# Patient Record
Sex: Male | Born: 1935 | Race: White | Hispanic: No | Marital: Married | Smoking: Former smoker
Health system: Southern US, Community
[De-identification: ages and names within clinical notes are randomized; demographics above are authoritative.]

## PROBLEM LIST (undated history)

## (undated) DIAGNOSIS — I350 Nonrheumatic aortic (valve) stenosis: Secondary | ICD-10-CM

## (undated) DIAGNOSIS — I10 Essential (primary) hypertension: Secondary | ICD-10-CM

## (undated) DIAGNOSIS — T4145XA Adverse effect of unspecified anesthetic, initial encounter: Secondary | ICD-10-CM

## (undated) DIAGNOSIS — T8859XA Other complications of anesthesia, initial encounter: Secondary | ICD-10-CM

## (undated) DIAGNOSIS — E785 Hyperlipidemia, unspecified: Secondary | ICD-10-CM

## (undated) DIAGNOSIS — I5032 Chronic diastolic (congestive) heart failure: Secondary | ICD-10-CM

## (undated) DIAGNOSIS — I472 Ventricular tachycardia, unspecified: Secondary | ICD-10-CM

## (undated) DIAGNOSIS — I2510 Atherosclerotic heart disease of native coronary artery without angina pectoris: Secondary | ICD-10-CM

## (undated) DIAGNOSIS — I4891 Unspecified atrial fibrillation: Secondary | ICD-10-CM

## (undated) DIAGNOSIS — I4892 Unspecified atrial flutter: Secondary | ICD-10-CM

## (undated) DIAGNOSIS — M1990 Unspecified osteoarthritis, unspecified site: Secondary | ICD-10-CM

## (undated) DIAGNOSIS — K219 Gastro-esophageal reflux disease without esophagitis: Secondary | ICD-10-CM

## (undated) DIAGNOSIS — I272 Pulmonary hypertension, unspecified: Secondary | ICD-10-CM

## (undated) DIAGNOSIS — K429 Umbilical hernia without obstruction or gangrene: Secondary | ICD-10-CM

## (undated) DIAGNOSIS — R252 Cramp and spasm: Secondary | ICD-10-CM

## (undated) DIAGNOSIS — J449 Chronic obstructive pulmonary disease, unspecified: Secondary | ICD-10-CM

## (undated) DIAGNOSIS — I219 Acute myocardial infarction, unspecified: Secondary | ICD-10-CM

## (undated) DIAGNOSIS — N189 Chronic kidney disease, unspecified: Secondary | ICD-10-CM

## (undated) HISTORY — DX: Chronic obstructive pulmonary disease, unspecified: J44.9

## (undated) HISTORY — DX: Unspecified atrial flutter: I48.92

## (undated) HISTORY — DX: Nonrheumatic aortic (valve) stenosis: I35.0

## (undated) HISTORY — DX: Unspecified atrial fibrillation: I48.91

## (undated) HISTORY — DX: Chronic kidney disease, unspecified: N18.9

## (undated) HISTORY — PX: HERNIA REPAIR: SHX51

## (undated) HISTORY — PX: BACK SURGERY: SHX140

## (undated) HISTORY — DX: Hyperlipidemia, unspecified: E78.5

## (undated) HISTORY — PX: APPENDECTOMY: SHX54

## (undated) HISTORY — DX: Chronic diastolic (congestive) heart failure: I50.32

## (undated) HISTORY — DX: Cramp and spasm: R25.2

## (undated) HISTORY — DX: Umbilical hernia without obstruction or gangrene: K42.9

## (undated) HISTORY — DX: Atherosclerotic heart disease of native coronary artery without angina pectoris: I25.10

## (undated) HISTORY — DX: Essential (primary) hypertension: I10

## (undated) HISTORY — PX: CORONARY ARTERY BYPASS GRAFT: SHX141

## (undated) HISTORY — PX: CHOLECYSTECTOMY: SHX55

---

## 2007-06-18 ENCOUNTER — Ambulatory Visit: Payer: Self-pay | Admitting: Cardiology

## 2007-06-19 ENCOUNTER — Ambulatory Visit: Payer: Self-pay | Admitting: Cardiology

## 2007-06-22 ENCOUNTER — Ambulatory Visit: Payer: Self-pay | Admitting: Cardiology

## 2007-06-29 ENCOUNTER — Ambulatory Visit: Payer: Self-pay | Admitting: Internal Medicine

## 2007-06-29 LAB — CONVERTED CEMR LAB
Basophils Absolute: 0 10*3/uL (ref 0.0–0.1)
Creatinine, Ser: 1.1 mg/dL (ref 0.4–1.5)
Eosinophils Absolute: 0.1 10*3/uL (ref 0.0–0.6)
Eosinophils Relative: 2.2 % (ref 0.0–5.0)
Glucose, Bld: 118 mg/dL — ABNORMAL HIGH (ref 70–99)
HCT: 37.5 % — ABNORMAL LOW (ref 39.0–52.0)
Hemoglobin: 12.8 g/dL — ABNORMAL LOW (ref 13.0–17.0)
MCHC: 34.1 g/dL (ref 30.0–36.0)
MCV: 92.6 fL (ref 78.0–100.0)
Monocytes Absolute: 0.6 10*3/uL (ref 0.2–0.7)
Neutrophils Relative %: 52.4 % (ref 43.0–77.0)
Potassium: 4.2 meq/L (ref 3.5–5.1)
RDW: 13.3 % (ref 11.5–14.6)
Sodium: 140 meq/L (ref 135–145)
aPTT: 72.1 s — ABNORMAL HIGH (ref 21.7–29.8)

## 2007-07-06 ENCOUNTER — Observation Stay (HOSPITAL_COMMUNITY): Admission: RE | Admit: 2007-07-06 | Discharge: 2007-07-08 | Payer: Self-pay | Admitting: Internal Medicine

## 2007-07-06 ENCOUNTER — Ambulatory Visit: Payer: Self-pay | Admitting: Internal Medicine

## 2007-07-13 ENCOUNTER — Ambulatory Visit: Payer: Self-pay | Admitting: Cardiology

## 2007-07-20 ENCOUNTER — Ambulatory Visit: Payer: Self-pay | Admitting: Cardiology

## 2007-07-27 ENCOUNTER — Ambulatory Visit: Payer: Self-pay | Admitting: Cardiology

## 2007-07-28 ENCOUNTER — Ambulatory Visit: Payer: Self-pay | Admitting: Cardiology

## 2007-07-29 ENCOUNTER — Ambulatory Visit: Payer: Self-pay | Admitting: Cardiology

## 2007-07-29 ENCOUNTER — Inpatient Hospital Stay (HOSPITAL_COMMUNITY): Admission: EM | Admit: 2007-07-29 | Discharge: 2007-08-01 | Payer: Self-pay | Admitting: Emergency Medicine

## 2007-08-07 ENCOUNTER — Ambulatory Visit: Payer: Self-pay | Admitting: Cardiology

## 2007-08-24 ENCOUNTER — Ambulatory Visit: Payer: Self-pay | Admitting: Cardiology

## 2007-08-27 ENCOUNTER — Ambulatory Visit: Payer: Self-pay | Admitting: Internal Medicine

## 2007-08-27 LAB — CONVERTED CEMR LAB
Creatinine, Ser: 1.3 mg/dL (ref 0.4–1.5)
Glucose, Bld: 105 mg/dL — ABNORMAL HIGH (ref 70–99)
Magnesium: 2.2 mg/dL (ref 1.5–2.5)
Potassium: 4.1 meq/L (ref 3.5–5.1)
Sodium: 141 meq/L (ref 135–145)

## 2007-09-21 ENCOUNTER — Ambulatory Visit: Payer: Self-pay | Admitting: Cardiology

## 2007-10-19 ENCOUNTER — Ambulatory Visit: Payer: Self-pay | Admitting: Cardiology

## 2007-11-13 ENCOUNTER — Ambulatory Visit: Payer: Self-pay | Admitting: Internal Medicine

## 2008-07-13 ENCOUNTER — Ambulatory Visit: Payer: Self-pay | Admitting: Internal Medicine

## 2008-12-28 DIAGNOSIS — R252 Cramp and spasm: Secondary | ICD-10-CM

## 2008-12-28 DIAGNOSIS — K429 Umbilical hernia without obstruction or gangrene: Secondary | ICD-10-CM | POA: Insufficient documentation

## 2008-12-28 DIAGNOSIS — J4489 Other specified chronic obstructive pulmonary disease: Secondary | ICD-10-CM | POA: Insufficient documentation

## 2008-12-28 DIAGNOSIS — I4891 Unspecified atrial fibrillation: Secondary | ICD-10-CM

## 2008-12-28 DIAGNOSIS — I4892 Unspecified atrial flutter: Secondary | ICD-10-CM | POA: Insufficient documentation

## 2008-12-28 DIAGNOSIS — R5383 Other fatigue: Secondary | ICD-10-CM

## 2008-12-28 DIAGNOSIS — I10 Essential (primary) hypertension: Secondary | ICD-10-CM | POA: Insufficient documentation

## 2008-12-28 DIAGNOSIS — J449 Chronic obstructive pulmonary disease, unspecified: Secondary | ICD-10-CM

## 2008-12-28 DIAGNOSIS — R0602 Shortness of breath: Secondary | ICD-10-CM

## 2008-12-28 DIAGNOSIS — R5381 Other malaise: Secondary | ICD-10-CM

## 2008-12-28 DIAGNOSIS — E785 Hyperlipidemia, unspecified: Secondary | ICD-10-CM

## 2008-12-29 ENCOUNTER — Ambulatory Visit: Payer: Self-pay | Admitting: Internal Medicine

## 2009-02-27 ENCOUNTER — Encounter: Payer: Self-pay | Admitting: *Deleted

## 2009-07-24 ENCOUNTER — Telehealth: Payer: Self-pay | Admitting: Internal Medicine

## 2009-08-04 ENCOUNTER — Ambulatory Visit: Payer: Self-pay | Admitting: Internal Medicine

## 2010-08-09 ENCOUNTER — Ambulatory Visit
Admission: RE | Admit: 2010-08-09 | Discharge: 2010-08-09 | Payer: Self-pay | Source: Home / Self Care | Attending: Internal Medicine | Admitting: Internal Medicine

## 2010-08-13 ENCOUNTER — Encounter: Payer: Self-pay | Admitting: Internal Medicine

## 2010-08-14 NOTE — Progress Notes (Signed)
Summary: refill meds  Phone Note Refill Request Call back at Home Phone [phone] Message from:  Patient on July 24, 2009 4:10 PM  Refills Requested: Medication #1:  KLOR-CON M20 20 MEQ CR-TABS Take 1 tablet by mouth once a day  Medication #2:  LISINOPRIL 5 MG TABS Take 1 tablet by mouth twice a day  Medication #3:  FUROSEMIDE 40 MG TABS Take 1 tablet by mouth once a day [facility] [place] [facility] [phone]   Method Requested: Fax to Local Pharmacy Initial call taken by: Lorne Skeens,  July 24, 2009 4:11 PM  Follow-up for Phone Call        Rx faxed to pharmacy Follow-up by: Vikki Ports,  July 24, 2009 4:20 PM    Prescriptions: LISINOPRIL 5 MG TABS (LISINOPRIL) Take 1 tablet by mouth twice a day  #60 x 6   Entered by:   Vikki Ports   Authorized by:   Laren Boom, MD, [facility]   Signed by:   Vikki Ports on 07/24/2009   Method used:   Faxed to ...       [facility] [address] (retail)       [address]       [place], [place]  [address]       Ph: [phone] or [phone]       Fax: [phone]   RxID:   [id number] KLOR-CON M20 20 MEQ CR-TABS (POTASSIUM CHLORIDE CRYS CR) Take 1 tablet by mouth once a day  #30 x 6   Entered by:   Vikki Ports   Authorized by:   Laren Boom, MD, [facility]   Signed by:   Vikki Ports on 07/24/2009   Method used:   Faxed to ...       [facility] [address] (retail)       [address]       [place], [place]  [address]       Ph: [phone] or [phone]       Fax: [phone]   RxID:   [id number] FUROSEMIDE 40 MG TABS (FUROSEMIDE) Take 1 tablet by mouth once a day  #30 x 6   Entered by:   Vikki Ports   Authorized by:   Laren Boom, MD, [facility]   Signed by:   Vikki Ports on 07/24/2009   Method used:   Faxed to ...       [facility] [address] (retail)       [address]       [place], [place]  [address]       Ph: [phone] or [phone]       Fax: [phone]   RxID:   [id number]

## 2010-08-14 NOTE — Assessment & Plan Note (Signed)
Summary: per check out/sf   Visit Type:  Follow-up Primary Provider:  Dr.Alexander at [facility]   History of Present Illness: Juan Wolf returns today for followup.  He is a pleasant 76 yo man with a h/o atrial flutter s/p ablation who developed symptomatic atrial fibrillation which has been well controlled with twice daily Tikosyn.  He denies c/p, sob, or palpitations.  No syncope. Minimal peripheral edema.  Current Medications (verified): 1)  Furosemide 40 Mg Tabs (Furosemide) .... Take 1 Tablet By Mouth Once A Day 2)  Klor-Con M20 20 Meq Cr-Tabs (Potassium Chloride Crys Cr) .... Take 1 Tablet By Mouth Once A Day 3)  Lisinopril 5 Mg Tabs (Lisinopril) .... Take 1 Tablet By Mouth Twice A Day 4)  Tikosyn 125 Mcg Caps (Dofetilide) .... Take 3 Capsules By Mouth Every Twelve Hours 5)  Warfarin Sodium 5 Mg Tabs (Warfarin Sodium) .... Take 1 Tablet By Mouth Once A Day  Allergies (verified): No Known Drug Allergies  Past History:  Past Medical History: Last updated: 12/28/2008 WEAKNESS (ICD-780.79) DYSPNEA (ICD-786.05) UMBILICAL HERNIA (ICD-553.1) DYSLIPIDEMIA (ICD-272.4) COPD (ICD-496) HYPERTENSION (ICD-401.9) LEG CRAMPS (ICD-729.82) ATRIAL FLUTTER (ICD-427.32) ATRIAL FIBRILLATION (ICD-427.31)  Past Surgical History: Last updated: 12/28/2008 Appendectomy Back Surgery Cholecystectomy  Review of Systems  The patient denies chest pain, syncope, dyspnea on exertion, and peripheral edema.    Vital Signs:  Patient profile:   77 year old male Height:      67 inches Weight:      211 pounds Pulse rate:   77 / minute BP sitting:   130 / 70  (left arm)  Vitals Entered By: Laurance Flatten CMA (August 04, 2009 4:04 PM)  Physical Exam  General:  Elderly well developed, well nourished, in no acute distress. Head:  normocephalic and atraumatic Eyes:  PERRLA/EOM intact; conjunctiva and lids normal. Mouth:  Teeth, gums and palate normal. Oral mucosa normal. Neck:   Neck supple, no JVD. No masses, thyromegaly or abnormal cervical nodes. Lungs:  Clear bilaterally to auscultation with no wheezes, rales, or rhonchi. Heart:  RRR with normal S1 and S2.  PMI is not enlarged or laterally displaced.  A soft systolic murmur is present at the base. Abdomen:  Bowel sounds positive; abdomen soft and non-tender without masses, organomegaly, or hernias noted. No hepatosplenomegaly. Msk:  Back normal, normal gait. Muscle strength and tone normal. Pulses:  pulses normal in all 4 extremities Extremities:  No clubbing or cyanosis. Neurologic:  Alert and oriented x 3.   EKG  Procedure date:  08/04/2009  Findings:      Normal sinus rhythm with rate of:  77  Impression & Recommendations:  Problem # 1:  ATRIAL FIBRILLATION (ICD-427.31) His rhythm has been well controlled with medications as noted below.  His QT is about 470 corrected. The following medications were removed from the medication list:    Tikosyn 250 Mcg Caps (Dofetilide) .Marland Kitchen... Take 1 capsule by mouth every twelve hours His updated medication list for this problem includes:    Tikosyn 125 Mcg Caps (Dofetilide) .Marland Kitchen... Take 3 capsules by mouth every twelve hours    Warfarin Sodium 5 Mg Tabs (Warfarin sodium) .Marland Kitchen... Take 1 tablet by mouth once a day  Problem # 2:  HYPERTENSION (ICD-401.9) His blood pressure appears to be fairly well controlled.  Continue current meds. A low sodium diet is recommended. His updated medication list for this problem includes:    Furosemide 40 Mg Tabs (Furosemide) .Marland Kitchen... Take 1 tablet by mouth once  a day    Lisinopril 5 Mg Tabs (Lisinopril) .Marland Kitchen... Take 1 tablet by mouth twice a day  Problem # 3:  COPD (ICD-496) He is asymptomatic at the present.  He does not smoke cigarettes.  Patient Instructions: 1)  Your physician recommends that you schedule a follow-up appointment in: 12 months

## 2010-08-16 NOTE — Assessment & Plan Note (Signed)
Summary: pc2   Primary Provider:  Dr.Alexander at [facility]  CC:  pt has no complaints today.  History of Present Illness: [name] returns today for followup.  He is a pleasant 77 yo man with a h/o atrial flutter s/p ablation who developed symptomatic atrial fibrillation which has been well controlled with twice daily Tikosyn.  He denies c/p, sob, or palpitations.  No syncope. Minimal peripheral edema.   Current Medications (verified): 1)  Furosemide 40 Mg Tabs (Furosemide) .... Take 1 Tablet By Mouth Once A Day 2)  Klor-Con M20 20 Meq Cr-Tabs (Potassium Chloride Crys Cr) .... Take 1 Tablet By Mouth Once A Day 3)  Lisinopril 5 Mg Tabs (Lisinopril) .... Take 1 Tablet By Mouth Twice A Day 4)  Tikosyn 125 Mcg Caps (Dofetilide) .... Take 3 Capsules By Mouth Every Twelve Hours 5)  Warfarin Sodium 5 Mg Tabs (Warfarin Sodium) .... Take 1 Tablet By Mouth Once A Day 6)  Zolpidem Tartrate 10 Mg Tabs (Zolpidem Tartrate) .Marland Kitchen.. 1 Tablet Before Bed Every Night 7)  Tylenol Extra Strength 500 Mg Tabs (Acetaminophen) .... As Needed  Allergies (verified): No Known Drug Allergies  Past History:  Past Medical History: Last updated: 12/28/2008 WEAKNESS (ICD-780.79) DYSPNEA (ICD-786.05) UMBILICAL HERNIA (ICD-553.1) DYSLIPIDEMIA (ICD-272.4) COPD (ICD-496) HYPERTENSION (ICD-401.9) LEG CRAMPS (ICD-729.82) ATRIAL FLUTTER (ICD-427.32) ATRIAL FIBRILLATION (ICD-427.31)  Past Surgical History: Last updated: 12/28/2008 Appendectomy Back Surgery Cholecystectomy  Review of Systems  The patient denies chest pain, syncope, dyspnea on exertion, and peripheral edema.    Vital Signs:  Patient profile:   77 year old male Height:      67 inches Weight:      208.25 pounds BMI:     32.73 Pulse rate:   80 / minute BP sitting:   132 / 62  (left arm)  Vitals Entered By: Celestia Khat, CMA (August 09, 2010 3:34 PM)  Physical Exam  General:  Elderly well developed, well nourished,  in no acute distress. Head:  normocephalic and atraumatic Eyes:  PERRLA/EOM intact; conjunctiva and lids normal. Mouth:  Teeth, gums and palate normal. Oral mucosa normal. Neck:  Neck supple, no JVD. No masses, thyromegaly or abnormal cervical nodes. Lungs:  Clear bilaterally to auscultation with no wheezes, rales, or rhonchi. Heart:  RRR with normal S1 and S2.  PMI is not enlarged or laterally displaced.  A soft systolic murmur is present at the base. Abdomen:  Bowel sounds positive; abdomen soft and non-tender without masses, organomegaly, or hernias noted. No hepatosplenomegaly. Msk:  Back normal, normal gait. Muscle strength and tone normal. Pulses:  pulses normal in all 4 extremities Extremities:  No clubbing or cyanosis. Neurologic:  Alert and oriented x 3.   EKG  Procedure date:  08/09/2010  Findings:      Normal sinus rhythm with rate of:  70.  Impression & Recommendations:  Problem # 1:  ATRIAL FIBRILLATION (ICD-427.31) He has remained NSR since his starting Tikosyn. He will continue his current meds. His updated medication list for this problem includes:    Tikosyn 125 Mcg Caps (Dofetilide) .Marland Kitchen... Take 3 capsules by mouth every twelve hours    Warfarin Sodium 5 Mg Tabs (Warfarin sodium) .Marland Kitchen... Take 1 tablet by mouth once a day  Problem # 2:  HYPERTENSION (ICD-401.9) His blood pressure is well controlled. His updated medication list for this problem includes:    Furosemide 40 Mg Tabs (Furosemide) .Marland Kitchen... Take 1 tablet by mouth once a day    Lisinopril 5  Mg Tabs (Lisinopril) .Marland Kitchen... Take 1 tablet by mouth twice a day  Patient Instructions: 1)  Your physician wants you to follow-up in:  12 months with Dr Court Joy will receive a reminder letter in the mail two months in advance. If you don't receive a letter, please call our office to schedule the follow-up appointment. 2)  Your physician recommends that you continue on your current medications as directed. Please refer to the  Current Medication list given to you today.

## 2010-11-27 NOTE — Op Note (Signed)
NAME:  Juan Wolf, Juan Wolf NO.:  [id number]   MEDICAL RECORD NO.:  [id number]          PATIENT TYPE:  OIB   LOCATION:  2899                         FACILITY:  [facility]   PHYSICIAN:  Doylene Canning. Ladona Ridgel, MD    DATE OF BIRTH:  23-Sep-1921   DATE OF PROCEDURE:  07/06/2007  DATE OF DISCHARGE:                               OPERATIVE REPORT   PROCEDURE PERFORMED:  Electrophysiologic study and catheter ablation of  atrial flutter.   INTRODUCTION:  The patient is an 77 year old male with a history of  symptomatic typical atrial flutter, who we saw in the office a week ago  and was found to be subtherapeutic with his Coumadin.  He was brought in  today for a TEE, but was found to be in sinus rhythm; having  spontaneously reverted after approximately 2 months of atrial flutter.  He is now referred for electrophysiologic study and catheter ablation of  his atrial flutter.   PROCEDURE:  After informed consent was obtained, the patient was taken  to the diagnostic EP lab in a fasting state.  After the usual  preparation and draping, intravenous fentanyl and midazolam was given  for sedation.  A 6-French hexapolar catheter was inserted percutaneously  into the right jugular vein and advanced to the coronary sinus.  A 5-  French quadripolar catheter was inserted percutaneously in the right  femoral vein and advanced to the His bundle region.  A 7-French 24 halo  catheter was inserted percutaneously in the right femoral vein and  advanced to the right atrium.  A 7-French quadripolar ablation catheter  was inserted percutaneously into the right femoral vein, and advanced  into the right atrium.  All catheter placements were done under  fluoroscopic guidance.  With the catheter in satisfactory position,  programmed atrial stimulation was carried out from the coronary sinus  and high right atrium. at a basic drive cycle length of 478  milliseconds.  The S1-S2 interval was stepwise decreased  from 500  milliseconds down to 450 milliseconds, where the AV node ERP was  observed.  During programmed atrial stimulation there were no AH jumps  and no echo beats noted.   Next, rapid atrial pacing was carried out from the coronary sinus at a  pacing cycle length of 600 milliseconds.  The pacing cycle length was  stepwise decreased down to 470 milliseconds, where AV Wenckebach was  observed.  During rapid atrial pacing the PR interval was less than the  RR interval, and there was no inducible SVT.  Next, additional rapid  atrial pacing was carried out at 290 milliseconds and stepwise decreased  down to 240 milliseconds; resulting in the initiation of atrial flutter.  This was typical counterclockwise tricuspid annular reentrant atrial  flutter, with a cycle length of 260 milliseconds demonstrated during  mapping.  The 7-French quadripolar ablation catheter was then maneuvered  into the usual atrial flutter isthmus.  A single RF energy application  was delivered, which terminated atrial flutter and restored sinus  rhythm.  Following this, additional RF energy application was delivered,  resulting in the initiation of  atrial fibrillation.  During atrial  fibrillation 2 additional RF energy applications were delivered, for a  total of 4 RF energy applications.  At this point the patient maintained  atrial fibrillation, with a controlled ventricular response.  A gram of  magnesium was then delivered intravenously, as well as 1 mg of  ibutilide.  Despite this the patient did not return to sinus rhythm.  The patient was then heavily sedated with fentanyl and Versed, and DC  cardioverted with 200 joules of synchronized biphasic energy --  restoring sinus rhythm.  At this point, rapid atrial pacing demonstrated  bidirectional atrial flutter isthmus block, and the patient was observed  for a total of 30 minutes following ablation and had persistent isthmus  block.   At this point, the  ablation catheter was maneuvered into the right  ventricle and rapid ventricular pacing was carried out demonstrating VA  Wenckebach at 600 milliseconds.  In addition, programmed ventricular  stimulation was carried out at 600 milliseconds, demonstrating VA  Wenckebach.  At this point the catheters were removed.  Hemostasis was  assured and the patient was returned to his room in satisfactory  condition.   COMPLICATIONS:  There were no immediate procedure complications.   RESULTS:  1. BASELINE ECG.  Baseline ECG demonstrates sinus rhythm with normal      axis and intervals.  2. BASELINE INTERVALS:  Sinus node cycle length was 950 milliseconds.      The HV interval was 38 milliseconds.  The QRS duration was 84      milliseconds.  3. RAPID VENTRICULAR PACING.  Rapid ventricular pacing was carried out      from the RV apex, demonstrating VA dissociation at 600      milliseconds.  4. PROGRAMMED VENTRICULAR STIMULATION.  Programmed ventricular      stimulation was carried out from the RV apex at a basic drive cycle      length of 600 milliseconds.  This demonstrated VA dissociation.  5. RAPID ATRIAL PACING.  Rapid atrial pacing was carried out from the      coronary sinus and high-right atrium, at a pacing cycle length of      600 milliseconds and stepwise decreased down to 470 milliseconds;      where AV Wenckebach was observed.  During rapid atrial pacing the      PR interval was less than the RR interval, and there was no      inducible SVT.  Additional decrements in the atrial pacing cycle      length down to 240 milliseconds prior to ablation resulted in the      initiation of atrial flutter.  6. PROGRAMMED ATRIAL STIMULATION:  Programmed atrial stimulation was      carried out from the coronary sinus and the high-right atrium at a      basic drive cycle length of 086 milliseconds.  The S1-S2 interval      was stepwise decreased from 500 milliseconds down to 450      milliseconds,  where the AV node ERP was observed.  During      programmed atrial stimulation there were no AH jumps and no echo      beats noted.  7. ARRHYTHMIAS OBSERVED.      a.     Atrial flutter initiation, rapid atrial pacing.  Duration       was sustained.  Termination was with catheter ablation.  Cycle       length 260 milliseconds.  b.     Atrial fibrillation initiation was spontaneous.  Duration       was sustained.  Termination was with DC cardioversion.      c.     AH mapping.  Mapping of atrial flutter demonstrated typical       counterclockwise tricuspid annular reentrant atrial flutter.  The       atrial flutter isthmus was of usual size and orientation.      d.     RF energy application.  A total of 4 RF energy applications       were delivered to the atrial flutter isthmus; resulting in       termination of flutter and restoration of sinus rhythm, and       creation of bidirectional block and atrial flutter isthmus.   CONCLUSION:  This study demonstrates successful electrophysiologic study  and RF catheter ablation of typical atrial flutter; with a total of 4 RF  energy applications delivered to the usual atrial flutter isthmus.  Resulting in termination of flutter, restoration of sinus rhythm and  creation of bidirectional block in the atrial flutter isthmus.  In  addition, this study demonstrates spontaneous occurring atrial  fibrillation of unclear clinical significance.      Doylene Canning. Ladona Ridgel, MD  Electronically Signed     GWT/MEDQ  D:  07/06/2007  T:  07/06/2007  Job:  [id number]   cc:   Learta Codding, MD,FACC  Dhruv Sherril Croon

## 2010-11-27 NOTE — Discharge Summary (Signed)
NAME:  Juan Wolf, Juan Wolf NO.:  [id number]   MEDICAL RECORD NO.:  [id number]          PATIENT TYPE:  OIB   LOCATION:  4730                         FACILITY:  [facility]   PHYSICIAN:  Doylene Canning. Ladona Ridgel, MD    DATE OF BIRTH:  1921/10/17   DATE OF ADMISSION:  07/06/2007  DATE OF DISCHARGE:  07/08/2007                               DISCHARGE SUMMARY   This patient has no known drug allergies.   Dictation greater than 35 minutes time.   FINAL DIAGNOSES:  1. Discharging day #2 status post electrophysiology study      radiofrequency catheter ablation of typical atrial flutter.  2. Spontaneous atrial fibrillation develops during the      electrophysiology procedure.  3. DCCV atrial fibrillation to sinus rhythm.  4. Sinus rhythm devolves to slow atrial fibrillation at about 5:00      p.m. after the procedure on December 22.  5. Patient has pauses in the overnight, longest 2.79 seconds when in      atrial fibrillation  6. Patient discharging in atrial fibrillation, but he says he feels      much better after ablation.  7. We planned the patient to be readmitted for Tikosyn therapy in the      future.   SECONDARY DIAGNOSES:  1. History of coronary artery disease status post coronary artery      bypass graft surgery in 1997.      a.     Percutaneous transluminal coronary angioplasty March 02, 1986.      b.     Rhythm problems after coronary artery bypass grafting,       question atrial fibrillation.  2. Two-D echocardiogram June 18, 2007; ejection fraction of 55-60%;      the basal inferior wall is akinetic.  3. Chronic obstructive pulmonary disease.  4. Hypertension.  5. Dyslipidemia.  6. History of umbilical hernia.  7. Status post cholecystectomy, back surgery and appendectomy.   PROCEDURE:  July 06, 2007, electrophysiology study with finding of  typical inducible atrial flutter.  The patient was in sinus rhythm at  the beginning of the procedure.  The  patient had a typical  counterclockwise caval tricuspid annulus reentry mechanism, and the  patient had successful radiofrequency catheter ablation.  There was  spontaneous atrial fibrillation during radiofrequency catheter ablation  therapy, and the patient had cardioversion prior to completing the  procedure.  The patient then was in sinus rhythm at the end of the  procedure.   BRIEF HISTORY:  Juan Wolf is an 77 year old male.  He has a history of  ischemic heart disease.  He is status post coronary artery bypass graft  surgery in 1997.   He has a history of palpitation and in the month preceding this  admission has increasing dyspnea.  He saw Dr. Sherril Croon.  Dr. Sherril Croon found he  was in atrial flutter.  He was referred to Dr. Andee Lineman.  He was started  on Coumadin.  He is now referred for catheter ablation of atrial  flutter.  He has no history of  frank syncope, very minimal chest  discomfort.  He has palpitations with flutter.  No pleuritic chest pain,  no heat or cold intolerance, no dysuria.   Treatment options have been discussed with the patient.  Risks, benefits  and expectations of catheter ablation have been discussed.  The patient  is not therapeutic with his INR.  He will come in for transesophageal  echocardiogram prior to the electrophysiology study.   HOSPITAL COURSE:  The patient presented December 22 electively.  He was  in sinus rhythm.  Transesophageal echocardiogram was not done.  He went  directly to the electrophysiology lab where he had atypical tricuspid  annulus dependent atrial flutter.  This was successfully cauterized with  radiofrequency catheter ablation.  He did have atrial fibrillation occur  spontaneously during the procedure.  He was cardioverted prior to  discharge and was in sinus rhythm at discharge.  However, approximately  three to four hours later, he lapsed into atrial fibrillation.  He has  maintained atrial fibrillation with controlled rates ever  since.  The  patient says he is feeling much better.  There was some thought that the  patient could start on Tikosyn therapy at this admission.  However, the  patient really would rather go home since it would involve staying over  Christmas.  The patient's Coumadin has been therapeutic this admission,  and the patient will continue on his home dose of Coumadin.  The  electrocardiogram has been examined when the patient was in sinus  rhythm, and the QT subset seemed determined to be 453 milliseconds.  This is thought to be adequate for the patient to start on Tikosyn  therapy if it is judged to be an option in the future.  The patient is  discharging in atrial fibrillation, controlled ventricular rate, blood  pressure 139/75, oxygen saturation 93% on room air.   His discharge medications are:  1. Coumadin 5 mg daily.  2. Alprazolam 0.5 mg daily.  3. Atenolol 25 mg daily.  4. Simvastatin 20 mg daily at bedtime.   FOLLOW-UP APPOINTMENTS:  Coumadin [facility] office  Monday December 29 at 9:45.  He will see Dr. Ladona Ridgel Monday January 26,  11:30.  At that time, perhaps antiarrhythmic Tikosyn therapy will be  discussed.  The patient will have weekly visits to the Coumadin Clinic,  and these will be faxed to Dr. Lubertha Basque office.      Maple Mirza, PA      Doylene Canning. Ladona Ridgel, MD  Electronically Signed    GM/MEDQ  D:  07/08/2007  T:  07/08/2007  Job:  [id number]   cc:   Bea Laura, MD,FACC  Doylene Canning. Ladona Ridgel, MD

## 2010-11-27 NOTE — Assessment & Plan Note (Signed)
[facility]                         ELECTROPHYSIOLOGY OFFICE NOTE   BOLESLAW, BORGHI                       MRN:          [id number]  DATE:06/29/2007                            DOB:          1922-01-03    REFERRING PHYSICIAN:  Learta Codding, MD,FACC   Mr. Calico is referred today by Dr. Lewayne Bunting for evaluation and  treatment of atrial flutter.  The patient is a very pleasant man with a  history of ischemic heart disease, status post bypass surgery in 1997.  He has a history of palpitations in the past and over the last month has  had increasing dyspnea.  He saw Dr. Doreen Beam and was found to be in  atrial flutter.  He was then referred to Dr. Andee Lineman.  He has been placed  on Coumadin, though his INR has been subtherapeutic, and he is now  referred for consideration for catheter ablation.  He has had no frank  syncope.  He has very minimal chest discomfort.  He does have  palpitations with his flutter.  There has been no pleuritic chest pain,  heat or cold intolerance, or dysuria.   PAST MEDICAL HISTORY:  1. Dyslipidemia.  2. Hypertension.  3. An umbilical hernia.  4. Coronary artery disease, with bypass surgery in 1997.   PAST SURGICAL HISTORY:  1. Cholecystectomy.  2. Back surgery.  3. Appendectomy in the past.   SOCIAL HISTORY:  The patient denies tobacco or ethanol abuse.   He has no known allergies.   FAMILY HISTORY:  Noncontributory at his advanced age.   REVIEW OF SYSTEMS:  Also notable for some mild arthritic complaints, but  otherwise unremarkable.   PHYSICAL EXAMINATION:  GENERAL:  He is a pleasant, well-appearing, 77-  year-old man in no acute distress.  VITAL SIGNS:  Blood pressure was 143/80, the pulse was 80 and irregular,  respirations were 18.  HEENT:  Normocephalic and atraumatic.  Pupils equal and round.  The  oropharynx was moist.  The sclerae were anicteric.  NECK:  Revealed no jugular venous distention.  There  was no thyromegaly.  Trachea was midline.  The carotids were 2+ and symmetric.  LUNGS:  Clear bilaterally to auscultation.  No wheezes, rales, or  rhonchi were present.  There was no increased work of breathing.  CARDIOVASCULAR:  Reveals an irregular rhythm, with normal S1 and S2.  PMI was not enlarged, nor was it laterally displaced.  ABDOMEN:  Soft, nontender, nondistended.  There was no organomegaly.  The bowel sounds were present.  There was no rebound or guarding.  EXTREMITIES:  Demonstrated no cyanosis, clubbing, or edema.  The pulses  were 2+ and symmetric.  NEUROLOGIC:  Alert and oriented x3, with cranial nerves intact.  Strength 5/5 and symmetric.  SKIN:  Normal.   EKG demonstrates atrial flutter, with variable AV conduction.   MEDICATIONS:  1. Warfarin as directed.  2. Alprazolam 0.5 a day.  3. Atenolol 25 a day.  4. Simvastatin 20 a day.  5. Aspirin 81 mg daily.   IMPRESSION:  1. New-onset atrial flutter.  2. Coronary artery  disease, status post previous bypass surgery.  3. Coumadin therapy secondary to his atrial flutter.   DISCUSSION:  I have discussed treatment options with Mr. Schara.  The  risks, benefits, goals, and expectations of catheter ablation have been  discussed.  The patient has not been therapeutic with his INR, and we  will have him come in for TEE flutter ablation in the next several days.     Doylene Canning. Ladona Ridgel, MD  Electronically Signed    GWT/MedQ  DD: 06/29/2007  DT: 06/30/2007  Job #: [id number]   cc:   Learta Codding, MD,FACC  Dhruv Sherril Croon

## 2010-11-27 NOTE — Assessment & Plan Note (Signed)
[facility]                         ELECTROPHYSIOLOGY OFFICE NOTE   NAME:BAILEYLaney, Bagshaw                       MRN:          [id number]  DATE:07/13/2008                            DOB:          1922/06/10    Mr. Mielke returns for followup.  He is very pleasant elderly male with  a history of atrial fibrillation which developed after atrial flutter  ablation.  The patient has maintained sinus rhythm very nicely on  Tikosyn therapy.  He returns today for followup.  He has rare leg  cramps.  The patient has otherwise been stable.  He notes that if he  takes plenty of potassium and eats plenty of bananas, his leg cramps  remained under control.  He had no other specific complaints today.  He  denies palpitations.   PAST MEDICAL HISTORY:  Of course is notable for COPD, hypertension, and  dyslipidemia.  The patient has 3-vessel coronary artery disease with  preserved LV function previously.   PHYSICAL EXAMINATION:  GENERAL:  He is pleasant elderly man in no acute  distress.  VITAL SIGNS:  The blood pressure was 125/63, the pulse 86 and regular,  the respirations were 18, weight was 222 pounds.  NECK:  No jugular venous distention.  LUNGS:  Clear bilaterally to auscultation.  No wheezes, rales, or  rhonchi.  Breath sounds were slightly decreased throughout.  There was  no increased work of breathing.  CARDIOVASCULAR:  Regular rate and rhythm.  Normal S1 and S2.  The heart  sounds were somewhat distant.  There was a grade 2/6 systolic murmur at  the left lower sternal border.  There was a grade 2/6 systolic murmur at  the right upper sternal border.  ABDOMEN:  Soft, nontender.  EXTREMITIES:  No cyanosis, clubbing, or edema.   The EKG demonstrates sinus rhythm with QT interval corrected of about  460 milliseconds.   IMPRESSION:  1. Atrial flutter status post ablation.  2. Atrial fibrillation following catheter ablation of atrial flutter.  3. Chronic  Tikosyn therapy which has maintained sinus rhythm very      nicely and with the patient having a fairly normal QT interval      despite Tikosyn.  4. Hypertension.  5. Fairly severe chronic obstructive pulmonary disease.  6. Chronic Coumadin therapy.   DISCUSSION:  Overall, Mr. Bibbee is stable.  I will see the patient back  for arrhythmia followup in the next 6 months.  He is instructed to  continue his current medical therapy.     Doylene Canning. Ladona Ridgel, MD  Electronically Signed    GWT/MedQ  DD: 07/13/2008  DT: 07/14/2008  Job #: [id number]   cc:   [facility]

## 2010-11-27 NOTE — Assessment & Plan Note (Signed)
[facility]                         ELECTROPHYSIOLOGY OFFICE NOTE   NAME:BAILEYToretto, Tingler                       MRN:          [id number]  DATE:08/27/2007                            DOB:          06-16-1922    Mr. Otero returns today for follow-up.  He is very pleasant elderly  male with a history of atrial flutter who underwent catheter ablation  and was found the time of his ablation to have an isolated episode of A  fib.  About a week and half after his ablation he spontaneously went  into atrial fibrillation.  He was placed on Tikosyn and returns today  for follow-up.  He has not had any additional palpitations and he  returns today in normal rhythm.  The patient notes he has a little bit  of swelling in his lower extremities.  His main question today is  whether or not he can get his medications filled at the [place].  His  medications include warfarin daily, alprazolam 0.5 a day Tikosyn 375  twice daily, lisinopril 5 twice daily, furosemide 40 a day and potassium  supplements.   On exam he is a pleasant elderly man in no distress.  Blood pressure was  148/64, the pulse 75 and regular, respirations were 18.  Weight was 218  pounds  NECK:  Revealed no jugular venous distention.  LUNGS:  Clear bilaterally to auscultation.  No wheezes, rales or rhonchi  are present.  CARDIOVASCULAR Exam regular rate and rhythm with normal S1-S2.  EXTREMITIES demonstrated no edema.   His EKG demonstrates sinus rhythm with normal axis and intervals.   IMPRESSION:  1. Atrial flutter status post ablation.  2. Atrial fibrillation now maintaining sinus rhythm on chronic Tikosyn      therapy.  3. Occasional leg cramps.   DISCUSSION:  Will obtain a BMP and magnesium level today to make sure  his electrolytes are within normal limits.  Additional follow-up be in  several months.     Doylene Canning. Ladona Ridgel, MD  Electronically Signed    GWT/MedQ  DD: 08/27/2007  DT:  08/28/2007  Job #: [id number]

## 2010-11-27 NOTE — Discharge Summary (Signed)
NAME:  Juan Wolf, Juan Wolf NO.:  [id number]   MEDICAL RECORD NO.:  [id number]          PATIENT TYPE:  INP   LOCATION:  2032                         FACILITY:  [facility]   PHYSICIAN:  Doylene Canning. Ladona Ridgel, MD    DATE OF BIRTH:  1921-12-11   DATE OF ADMISSION:  07/29/2007  DATE OF DISCHARGE:  08/01/2007                               DISCHARGE SUMMARY   ALLERGIES:  He has an intolerance to ZOCOR which causes leg cramps.   Time for this dictation and exam greater than 40 minutes.   PRIMARY CARE PHYSICIAN:  Doreen Beam, MD   DISCHARGE DIAGNOSES:  1. Admitted with progressive dyspnea, weakness and fatigue.  2. History of atrial flutter with atrial flutter ablation on July 06, 2007.  3. Atrial fibrillation, spontaneous occurrence during atrial flutter      ablation.  4. Direct-current cardioversion of atrial fibrillation in      electrophysiologic lab to sinus rhythm with recurrence.  5. Intolerance of atrial fibrillation with loss of atrial kick      admitted for Tikosyn to reinitiate sinus rhythm.  6. The patient converted after one dose of Tikosyn to sinus rhythm,      but has been in and out of atrial fibrillation for the next 48-72      hours.  7. Prolongation of the QT on a 500 mcg dose of Tikosyn.  Dose at      discharge 375 mcg every 12 hours.  8. Three-vessel coronary artery disease, status post percutaneous      transluminal coronary angiography of 1987, and coronary artery      bypass graft surgery in 1997.  9. Chronic obstructive pulmonary disease.  10.Hypertension.  11.Dyslipidemia.  12.History of umbilical hernia.  13.Status post cholecystectomy.  14.Status post back surgery.  15.Status post appendectomy.   HISTORY OF PRESENT ILLNESS:  Juan Wolf is an 77 year old male.  He has  a past medical history of coronary artery disease.  He is status post  coronary artery bypass graft surgery in 1997.  He had a previous PTCA in  1987.  The patient has a  history of atrial arrhythmias.   The patient presented for atrial flutter ablation by Dr. Ladona Ridgel on  July 06, 2007.  He had spontaneous occurrence of atrial  fibrillation.  This was sustained during the electrophysiology  procedure.  It took DCCV to resolve, but overnight on telemetry, his  atrial fibrillation recurred.   The patient had an echocardiogram on June 18, 2007.  This study  showed ejection fraction of 55-60%, but there was akinesis of the basal  inferior wall.   The day after his ablation, the patient initially felt much better, but  lately he has been having trouble.  He gets weak, short of breath just  crossing a room.  He is fatigued at all times.  He does not complain of  chest pain, dizziness or presyncope.  Electrocardiogram  on July 29, 2007, shows atrial fibrillation with controlled ventricular rates at 73  beats a minute, QRS is 86 msec, QT is 388  msec.   The patient was initially sent to see Dr. Lewayne Bunting in followup on  January 26, to discuss possibility of antiarrhythmic therapy, but  he  and the son decided that his symptoms are too pronounced for him to be  able to wait until that time and so he is being transferred from  [facility] by Dr. Andee Lineman for Tikosyn initiation.   Of note, his admission to x-ray shows mild to moderate pulmonary  congestion consistent with edema.   HOSPITAL COURSE:  The patient was admitted January 14.  All his pre-  Tikosyn labs were within normal limits before starting Tikosyn.  He was  started on 500 mcg and converted to sinus rhythm after one dose.  However, his QT interval was prolonged and Dr. Ladona Ridgel reduced the 500  mcg dose to 375 mcg twice daily.  In the last 48 hours, the patient has  been in and out of atrial fibrillation.  Cardioversion was planned for  January 16, but it was cancelled because the patient was in sinus  rhythm.  This was to occur after dose #4, however, the patient has been  going in and out  of atrial fibrillation so that the benefits of  cardioversion are moot.  On the morning of January 16, hospital day #3,  the patient said that he has been having attacks of dyspnea with  anxiety.  His oxygen saturations were 94%, but it was decided based on  chest x-ray re-examination, that the patient would benefit from IV Lasix  diuresis.  He did over a 6-hour period have 1150 mL of urine output and  states that his breathing is much better.  It is possible the patient  has, even though a normal ejection fraction, some amount of left  ventricular diastolic dysfunction.  In addition, the patient's blood  pressure has been running greater than 155 systolic over a sustained  course on his current medications.  Because of this, the patient has  been started on Lasix 40 mg daily with supplementation of potassium 20  mEq daily and lisinopril 5 mg twice daily has been added, not only for  blood pressure control, but also perhaps for improvement in left  ventricular compliance.  The patient will discharge on January 17, after  taking dose #6 of his Tikosyn.   DISCHARGE MEDICATIONS:  1. Tikosyn 375 mcg twice daily, 12 hours apart.  This is done by      taking one 250 mcg tablet and one 125 mcg tablet together twice      daily.  2. Lasix 40 mg daily (new medication).  3. Potassium chloride 20 mEq daily (new medication).  4. Lisinopril 5 mg twice daily (new medication).  5. Coumadin 5 mg daily as directed by the Coumadin clinic.  6. Xanax 0.5 mg daily at bedtime.  7. Atenolol 25 mg daily.   FOLLOWUP:  Coumadin clinic at [facility], [facility], Friday,  January 23, at 9:45 a.m.  He will see Dr. Ladona Ridgel at [facility],  Thursday, February 12, at 9:30 a.m.   LABORATORY DATA AND X-RAY FINDINGS:  Complete blood count January 15,  with hemoglobin 12.5, hematocrit 38, white cells 6.8, platelets of 174.  Serum electrolytes on January 16, with sodium 138, potassium 3.9,  chloride 104,  carbonate 28, BUN 16, creatinine 1.07, glucose 95.  Pro  time has been above 2 throughout this hospitalization on his 5 mg dose.  Alkaline phosphatase this admission 75, SGOT is 21,  SGPT is 17.  Magnesium was 2.5.  TSH was 1.593.  The BNP on admission was to 259.   Chest x-ray shows increased interstitial markings suspicious for mild  congestion/edema.  He also has persistent cardiomegaly and bilateral  small pleural effusions with basilar atelectasis.      Maple Mirza, PA      Doylene Canning. Ladona Ridgel, MD  Electronically Signed    GM/MEDQ  D:  07/31/2007  T:  07/31/2007  Job:  [id number]   cc:   Learta Codding, MD,FACC  Doreen Beam, MD

## 2010-11-27 NOTE — Assessment & Plan Note (Signed)
[facility]                                 ON-CALL NOTE   NAME:Juan Wolf, Juan Wolf                       MRN:          [id number]  DATE:07/05/2007                            DOB:          12-03-1921    PRIMARY CARDIOLOGIST:  Dr. Lewayne Bunting.   ELECTROPHYSIOLOGIST:  Dr. Lewayne Bunting.   Telephone contact note dated July 05, 2007 as follows:  [name] was seen by Dr. Ladona Ridgel recently and scheduled for an atrial  flutter ablation.  He called because he was not sure if he should take  his Coumadin.  I reassured him that he should continue to take his  Coumadin, and that we did want his Coumadin to be therapeutic when he  came in for the procedure.  He and his wife stated they understood and  would comply.      Theodore Demark, PA-C  Electronically Signed      Jesse Sans. Daleen Squibb, MD, [facility]  Electronically Signed   RB/MedQ  DD: 07/05/2007  DT: 07/06/2007  Job #: [id number]

## 2010-11-27 NOTE — Assessment & Plan Note (Signed)
[facility]                         ELECTROPHYSIOLOGY OFFICE NOTE   NAME:BAILEYNkosi, Cortright                       MRN:          [id number]  DATE:11/13/2007                            DOB:          04/23/1922    Mr. Morera returns today for follow-up.  He is a very pleasant elderly  male with a history of atrial flutter, status post ablation, who  subsequently developed atrial fibrillation and has been maintaining  sinus rhythm predominantly on Tikosyn therapy.  The patient returns  today for follow-up.  He continues to complain of occasional leg cramps,  which he had had when I saw him back 3 months ago.  At that time I had  asked that he try drinking more orange juice at night, and he has  actually done so with improvement though he still continues to have  them.  He has also tried some supplemental potassium obtained in over-  the-counter preparations.  He denies chest pain.  He had no other  specific complaints today.  He continues on his Coumadin therapy.   CURRENT MEDICATIONS:  1. Warfarin as directed.  2. Alprazolam 0.5 mg a day.  3. Tikosyn 375 mcg q.12 h.  4. Lisinopril 5 mg q.12 h.  5. Furosemide 40 mg a day.  6. Potassium 20 mEq daily.   PHYSICAL EXAM:  Notable for a pleasant, elderly man in no distress.  Blood pressure 146/66, the pulse 72 and regular, respirations were 18.  The weight was 219 pounds.  NECK:  No jugular venous distention.  LUNGS:  Clear bilaterally to auscultation.  No wheezes, rales or rhonchi  are present.  There is no increased work of breathing.  CARDIOVASCULAR:  Regular rate and rhythm, normal S1 and S2.  No murmurs,  rubs or gallops present.  EXTREMITIES:  No edema.   IMPRESSION:  1. Paroxysmal atrial fibrillation, maintaining sinus rhythm on chronic      Tikosyn therapy.  2. Atrial flutter, status post ablation.  3. Occasional leg cramps.   DISCUSSION:  Overall Mr. Phenix is stable and is maintaining sinus  rhythm on Tikosyn.  We will plan to see the patient back in the office  in several months.     Doylene Canning. Ladona Ridgel, MD  Electronically Signed    GWT/MedQ  DD: 11/13/2007  DT: 11/13/2007  Job #: [id number]

## 2010-11-27 NOTE — H&P (Signed)
NAME:  Juan Wolf, Juan Wolf NO.:  [id number]   MEDICAL RECORD NO.:  [id number]          PATIENT TYPE:  INP   LOCATION:  2032                         FACILITY:  [facility]   PHYSICIAN:  Maple Mirza, PA   DATE OF BIRTH:  14-Mar-1922   DATE OF ADMISSION:  07/29/2007  DATE OF DISCHARGE:                              HISTORY & PHYSICAL   Electrophysiologist is  Dr. Lewayne Bunting. Cardiologist is Dr. Learta Codding.  His primary caregiver is Dr. Doreen Beam.   ALLERGIES:  This patient has intolerance of ZOCOR, which causes leg  cramps.   PRESENTING CIRCUMSTANCE:  I can hardly breathe doing anything.   HISTORY OF PRESENT ILLNESS:  Juan Wolf is an 77 year old male with a  past medical history of coronary artery disease status post coronary  artery bypass graft surgery in 1997.  He had previous PTCA in 1987.  This patient has a history of atrial arrhythmias.   The patient presented for atrial flutter ablation by Dr. Ladona Ridgel on  July 06, 2007.  He has spontaneous recurrence of atrial fibrillation  which was sustained during the electrophysiology procedure itself. It  took  DCCV to resolve, but overnight on telemetry his atrial  fibrillation returned.  Echocardiogram June 18, 2007 showed ejection  fraction of 55-60%.  There was akinesis of the basal inferior wall.   On arising the day after his ablation the patient initially felt better  but lately he has been having trouble. He gets weak, short of breath  just crossing a room. He is fatigued at all times.  He does not complain  of chest pain, dizziness or pre/syncope. Electrocardiogram July 29, 2007 shows atrial fibrillation controlled ventricular rate is 73 beats  per minute, QRS is 86 milliseconds, and the QT is 388 milliseconds.   PAST MEDICAL HISTORY:  The patient's past medical history also  significant for cardiac risk factors of hypertension and dyslipidemia.  The patient denies prior history of myocardial  infarction, diabetes,  cerebrovascular accident and GI bleed.   MEDICATIONS:  Atenolol 25 mg daily, Xanax 0.5 mg at bedtime, Coumadin 5  mg daily and Zocor 20 mg at bedtime.  This is currently on hold.  The  patient has determined as the cause of his leg cramps. He ran out of the  medication and the pharmacist was closed. He was off it for 2 days, and  his leg cramps subsided. He concluded that Zocor caused his leg cramps,  which had been a longstanding problem.   SOCIAL HISTORY:  The patient does not smoke.  He has 60 years history of  cigar smoking but quit in 1997 after bypass. Does not partake of  alcoholic beverages.   REVIEW OF SYSTEMS:  CONSTITUTIONAL:  No fevers, chills, night sweats,  weight change recently or adenopathy.  HEENT: No prolonged epistaxis, although he says he has had some bouts of  it about once a week lately.  He is on Coumadin, and he has hot-air  heat. He does have a constant postnasal drip.  INTEGUMENT:  No rashes or nonhealing ulcerations.  CARDIOPULMONARY:  The patient has no chest pain. He is short of breath  with the least bit of exertion.  No orthopnea, no paroxysmal nocturnal  dyspnea. He does have lower extremity edema at times and feeling of  palpitations at times but no syncope or presyncope.  UROGENITAL:  Nocturia.  NEUROPSYCHIATRIC:  Weak.  This is thought to be secondary to his atrial  fib.  GI: No GERD.  No history of GI bleed.  ENDOCRINE:  No diabetes or thyroid problems.  MUSCULOSKELETAL: No complaints of effusions, deformities, or  arthralgias.   PHYSICAL EXAM:  VITAL SIGNS: Temperature 97.5 pulse 81, blood pressure  162/75, respirations 24, oxygen saturation 95% on room air.  Alert and oriented x3. He is hard of hearing.  HEENT:  Normocephalic, atraumatic.  Eyes: Pupils equal, round, reactive  to light.  Extraocular movements intact.  Sclerae show arcus senilis.  NECK:  Neck is supple without carotid bruits.  No jugular venous   distention.  No cervical lymphadenopathy.  HEART:  Irregular rate and rhythm without murmur.  Lungs: Clear to  auscultation and percussion bilaterally.  ABDOMEN:  The deeper structures not palpated.  No rebound or guarding.  Nontender and nondistended.  EXTREMITIES:  Show no evidence of clubbing, cyanosis or edema.  MUSCULOSKELETAL: No joint deformity effusions, kyphosis.  NEUROLOGIC:  Cranial nerves II-XII grossly intact.  Grip strength  bilaterally 5/5.   LABORATORY RESULTS:  Chest x-ray is pending.  Laboratory studies are  now. The hemoglobin is 12.7, hematocrit 39, white cells 8.1, platelets  171. PTT is 41, INR 2.1.  Serum electrolytes sodium is 138, potassium  4.2, chloride 107, carbonate 27, BUN is 19, creatinine 1.17, glucose 90.  The patient's weight is 220. The BNP is 259, magnesium 2.5.   IMPRESSION:  1. Admit with progressive dyspnea, weakness and fatigue.  2. Atrial fibrillation ablation July 05, 2008. The patient was      spontaneously in atrial fibrillation during the EP study.  He was      cardioverted to sinus rhythm, but relapsed overnight to atrial      fibrillation.  3. Three-vessel coronary artery disease, percutaneous transluminal      coronary angioplasty.  1987, coronary artery bypass graft 1997.  4. Chronic obstructive pulmonary disease.  5. Hypertension.  6. Dyslipidemia.  7. History of umbilical hernia.  8. Status post cholecystectomy, back surgery, appendectomy.   PLAN:  1. We will get a chest x-ray both PA and lateral to rule out a      pulmonary process.  2. His BNP and TSH both are pending.  3. Potassium, magnesium and creatinine which were important to begin      Tikosyn. We will start Tikosyn the night of July 29, 2007.  We      will hold Zocor, as the patient has demonstrated that this      medication produces leg cramps for him.      Maple Mirza, PA    GM/MEDQ  D:  07/29/2007  T:  07/30/2007  Job:  [id number]

## 2011-03-28 ENCOUNTER — Other Ambulatory Visit: Payer: Self-pay | Admitting: Internal Medicine

## 2011-04-04 LAB — CBC
HCT: 39
Hemoglobin: 12.5 — ABNORMAL LOW
MCHC: 32.5
MCHC: 32.9
MCV: 91.9
MCV: 91.1
Platelets: 171
RBC: 4.17 — ABNORMAL LOW
RDW: 14.5
WBC: 8.1
WBC: 6.8

## 2011-04-04 LAB — BASIC METABOLIC PANEL
BUN: 19
BUN: 16
BUN: 18
CO2: 27
CO2: 29
Calcium: 9.2
Calcium: 8.7
Chloride: 107
Chloride: 102
Chloride: 104
Creatinine, Ser: 1.17
Creatinine, Ser: 1.07
Creatinine, Ser: 1.4
GFR calc non Af Amer: 51 — ABNORMAL LOW
GFR calc non Af Amer: >60
Glucose, Bld: 90
Glucose, Bld: 95
Potassium: 4.3

## 2011-04-04 LAB — DIFFERENTIAL
Basophils Absolute: 0
Basophils Relative: 0
Eosinophils Absolute: 0.1
Eosinophils Relative: 1
Lymphs Abs: 1.8
Monocytes Absolute: 0.6
Monocytes Relative: 9
Neutro Abs: 4.2
Neutrophils Relative %: 62
Neutrophils Relative %: 62

## 2011-04-04 LAB — HEPATIC FUNCTION PANEL
AST: 21
Albumin: 3.5
Alkaline Phosphatase: 75
Bilirubin, Direct: 0.2
Total Bilirubin: 1.2

## 2011-04-04 LAB — PROTIME-INR
Prothrombin Time: 24.3 — ABNORMAL HIGH
Prothrombin Time: 24 — ABNORMAL HIGH

## 2011-04-04 LAB — TSH: TSH: 1.593

## 2011-04-04 LAB — MAGNESIUM: Magnesium: 2.5

## 2011-04-15 ENCOUNTER — Other Ambulatory Visit: Payer: Self-pay | Admitting: Internal Medicine

## 2011-04-17 ENCOUNTER — Other Ambulatory Visit: Payer: Self-pay

## 2011-04-17 MED ORDER — LISINOPRIL 10 MG PO TABS: 10.0000 mg | ORAL_TABLET | Freq: Two times a day (BID) | ORAL | Status: DC

## 2011-04-19 LAB — PROTIME-INR
INR: 2.8 — ABNORMAL HIGH
Prothrombin Time: 27.8 — ABNORMAL HIGH
Prothrombin Time: 30.9 — ABNORMAL HIGH

## 2011-04-19 LAB — BASIC METABOLIC PANEL
BUN: 21
Calcium: 9
Creatinine, Ser: 1.18

## 2011-04-19 LAB — CBC
MCV: 92.1
Platelets: 169
RDW: 13.9
WBC: 6.8

## 2011-04-19 LAB — MAGNESIUM: Magnesium: 2.5

## 2011-05-27 ENCOUNTER — Encounter: Payer: Self-pay | Admitting: *Deleted

## 2011-05-28 ENCOUNTER — Encounter: Payer: Self-pay | Admitting: Physician Assistant

## 2011-05-28 ENCOUNTER — Ambulatory Visit (INDEPENDENT_AMBULATORY_CARE_PROVIDER_SITE_OTHER): Payer: Self-pay | Admitting: Physician Assistant

## 2011-05-28 VITALS — BP 146/66 | HR 87 | Ht 67.0 in | Wt 207.8 lb

## 2011-05-28 DIAGNOSIS — I10 Essential (primary) hypertension: Secondary | ICD-10-CM

## 2011-05-28 DIAGNOSIS — R079 Chest pain, unspecified: Secondary | ICD-10-CM | POA: Insufficient documentation

## 2011-05-28 DIAGNOSIS — R011 Cardiac murmur, unspecified: Secondary | ICD-10-CM | POA: Insufficient documentation

## 2011-05-28 DIAGNOSIS — I4891 Unspecified atrial fibrillation: Secondary | ICD-10-CM

## 2011-05-28 DIAGNOSIS — I2510 Atherosclerotic heart disease of native coronary artery without angina pectoris: Secondary | ICD-10-CM | POA: Insufficient documentation

## 2011-05-28 MED ORDER — NITROGLYCERIN 0.4 MG SL SUBL: 0.4000 mg | SUBLINGUAL_TABLET | SUBLINGUAL | Status: DC | PRN

## 2011-05-28 MED ORDER — ISOSORBIDE MONONITRATE ER 30 MG PO TB24: 30.0000 mg | ORAL_TABLET | Freq: Every day | ORAL | Status: DC

## 2011-05-28 NOTE — Assessment & Plan Note (Signed)
Sounds like AS.  Will arrange echo and follow up as noted.

## 2011-05-28 NOTE — Assessment & Plan Note (Signed)
Borderline control.  Add Imdur as noted.

## 2011-05-28 NOTE — Assessment & Plan Note (Signed)
Maintaining NSR on Tikosyn.  Coumadin managed by PCP.

## 2011-05-28 NOTE — Progress Notes (Signed)
History of Present Illness: PCP:  Dr. Doreen Beam Primary Cardiologist:  Dr. Lewayne Bunting   Juan Wolf is a 77 y.o. male who presents for chest tightness.  He has a h/o CAD, s/p PCI in the 1980s and CABG 1997, AFlutter, s/p RFCA, AFib maintaining NSR on Tikosyn Rx and coumadin, HTN, COPD, HLP.  Last echo 12/08: EF 55-60%.    He notes over the last 1-2 mos chest tightness if he "over does it."  Symptoms are mainly noted after eating.  No radiation.  No significant dyspnea.  No syncope.  No orthopnea, PND or edema.  No rest symptoms.  No palpitations.  Does not remind him of previous angina, although he cannot remember.  Does not recall having any recent stress testing and I cannot locate a recent nuclear study in the chart.    Past Medical History  Diagnosis Date  . Other malaise and fatigue   . Shortness of breath   . Umbilical hernia without mention of obstruction or gangrene   . Other and unspecified hyperlipidemia   . COPD (chronic obstructive pulmonary disease)   . Hypertension   . Cramp of limb   . Atrial flutter     s/p RFCA   . Atrial fibrillation     tikosyn rx  . CAD (coronary artery disease)     s/p CABG 1997;  echo 2008: EF 55-60%    Current Outpatient Prescriptions  Medication Sig Dispense Refill  . dofetilide (TIKOSYN) 125 MCG capsule Take 125 mcg by mouth 3 (three) times daily.       . furosemide (LASIX) 40 MG tablet Take 40 mg by mouth daily.        Marland Kitchen gabapentin (NEURONTIN) 100 MG capsule Take 100 mg by mouth 2 (two) times daily.        Marland Kitchen KLOR-CON M20 20 MEQ tablet TAKE 1 TABLET ONCE DAILY  30 each  12  . lisinopril (PRINIVIL,ZESTRIL) 10 MG tablet Take 10 mg by mouth daily at 6 PM.        . naproxen sodium (ANAPROX) 220 MG tablet Take 220 mg by mouth as needed.        . warfarin (COUMADIN) 5 MG tablet Take 5 mg by mouth daily.        Marland Kitchen zolpidem (AMBIEN) 10 MG tablet Take 10 mg by mouth at bedtime as needed.        . isosorbide mononitrate (IMDUR) 30 MG 24 hr  tablet Take 1 tablet (30 mg total) by mouth daily.  30 tablet  11  . nitroGLYCERIN (NITROSTAT) 0.4 MG SL tablet Place 1 tablet (0.4 mg total) under the tongue every 5 (five) minutes as needed for chest pain.  25 tablet  3    Allergies: No Known Allergies  History  Substance Use Topics  . Smoking status: Former Games developer  . Smokeless tobacco: Not on file  . Alcohol Use: Not on file     ROS:  Please see the history of present illness.   All other systems reviewed and negative.   Vital Signs: BP 146/66  Pulse 87  Ht 5\' 7"  (1.702 m)  Wt 207 lb 12.8 oz (94.257 kg)  BMI 32.55 kg/m2  PHYSICAL EXAM: Well nourished, well developed, in no acute distress HEENT: normal Neck: no JVD Vascular: carotids 2 + bilaterally Endo: no thyromegaly Cardiac:  normal S1, S2; RRR; 2/6 harsh systolic murmur at RUSB and along LSB Lungs:  clear to auscultation bilaterally, no wheezing, rhonchi or rales  Abd: soft, nontender, no hepatomegaly Ext: trace ankle bilateral edema Skin: warm and dry Neuro:  CNs 2-12 intact, no focal abnormalities noted Psych: normal affect  EKG:   NSR, HR 87, normal axis, inf Q waves, PRWP, NSSTTW changes  ASSESSMENT AND PLAN:

## 2011-05-28 NOTE — Patient Instructions (Signed)
You have been referred to FOLLOW UP WITH WITH ONE OF THE PHYSICIAN'S IN [place] IN 2-3 WEEKS; PT PREFERS DR. Andee Lineman  Your physician has requested that you have a lexiscan myoview DX 786.50 CHEST PAIN THIS IS TO BE DONE IN [place]. For further information please visit [email or web address] Please follow instruction sheet, as given.   Your physician has requested that you have an echocardiogram DX 785.2 HEART MURMUR THIS IS TO BE DONE IN [place]. Echocardiography is a painless test that uses sound waves to create images of your heart. It provides your doctor with information about the size and shape of your heart and how well your heart's chambers and valves are working. This procedure takes approximately one hour. There are no restrictions for this procedure.  Your physician has recommended you make the following change in your medication: START IMDUR 30 MG 1 TABLET DAILY

## 2011-05-28 NOTE — Assessment & Plan Note (Signed)
Seems to be describing post prandial angina.  I had a long d/w the patient regarding further workup.  We discussed proceeding with stress testing vs proceeding directly to [facility].  Given his advanced age, I prefer medical rx and stress testing initially and he agreed with this approach.  It has been several years since his last assessment for ischemia.  I will arrange a Steffanie Dunn at [facility].  He has a murmur suggestive of AS and I will have him undergo an echo as well at [facility].  I will make sure he has NTG to use PRN and will start Imdur 30 mg QD.  He lives in [place] and could follow up in our clinic there and he prefers this as it is hard for him to come to [place].  He would like to see Dr. Andee Lineman again and I will arrange follow up in 2-3 weeks.

## 2011-05-28 NOTE — Assessment & Plan Note (Signed)
Schedule myoview and echo as noted.  Follow up in [place] clinic in 2-3 weeks.

## 2011-05-30 DIAGNOSIS — R0602 Shortness of breath: Secondary | ICD-10-CM

## 2011-05-30 DIAGNOSIS — R079 Chest pain, unspecified: Secondary | ICD-10-CM

## 2011-05-31 ENCOUNTER — Other Ambulatory Visit: Payer: Self-pay | Admitting: *Deleted

## 2011-05-31 DIAGNOSIS — R011 Cardiac murmur, unspecified: Secondary | ICD-10-CM

## 2011-05-31 DIAGNOSIS — R079 Chest pain, unspecified: Secondary | ICD-10-CM

## 2011-06-03 ENCOUNTER — Encounter: Payer: Self-pay | Admitting: Cardiovascular Disease

## 2011-06-03 ENCOUNTER — Ambulatory Visit (INDEPENDENT_AMBULATORY_CARE_PROVIDER_SITE_OTHER): Payer: Medicare Other | Admitting: Cardiovascular Disease

## 2011-06-03 DIAGNOSIS — I350 Nonrheumatic aortic (valve) stenosis: Secondary | ICD-10-CM

## 2011-06-03 DIAGNOSIS — I10 Essential (primary) hypertension: Secondary | ICD-10-CM

## 2011-06-03 DIAGNOSIS — I2510 Atherosclerotic heart disease of native coronary artery without angina pectoris: Secondary | ICD-10-CM

## 2011-06-03 DIAGNOSIS — Z79899 Other long term (current) drug therapy: Secondary | ICD-10-CM

## 2011-06-03 DIAGNOSIS — I359 Nonrheumatic aortic valve disorder, unspecified: Secondary | ICD-10-CM

## 2011-06-03 DIAGNOSIS — I4891 Unspecified atrial fibrillation: Secondary | ICD-10-CM

## 2011-06-03 MED ORDER — DOFETILIDE 125 MCG PO CAPS: ORAL_CAPSULE | ORAL | Status: DC

## 2011-06-03 MED ORDER — LISINOPRIL 5 MG PO TABS: 5.0000 mg | ORAL_TABLET | Freq: Two times a day (BID) | ORAL | Status: DC

## 2011-06-03 MED ORDER — METOPROLOL SUCCINATE ER 25 MG PO TB24: 25.0000 mg | ORAL_TABLET | Freq: Every day | ORAL | Status: DC

## 2011-06-03 NOTE — Progress Notes (Signed)
HPI  This is an 77 year old male who is here today for a followup visit. He was seen recently by Tereso Newcomer. He has known history of coronary artery disease status post angioplasty in 1987. He had coronary artery bypass graft surgery in 1997 at [facility] No cardiac ischemic event since then. He also has history of atrial flutter status post ablation. He also has atrial fibrillation but maintained in sinus rhythm with dofetilide. The patient was seen recently for exertional chest pain. He was started on Imdur. He underwent evaluation with a nuclear stress test which showed evidence of an inferior infarct without significant ischemia. An echocardiogram was done do to aortic stenosis murmur. This showed normal LV systolic function with moderate to severe aortic stenosis. Mean gradient was 27 mm mercury with a calculated valve area of 0.92. The patient reports slight improvement in his symptoms. He continues to have substernal chest tightness with activities and it resolves with rest. It usually does not last for more than 5 minutes at a time. He has not had any rest symptoms. Overall this patient has been relatively functional.  No Known Allergies   Current Outpatient Prescriptions on File Prior to Visit  Medication Sig Dispense Refill  . furosemide (LASIX) 40 MG tablet Take 40 mg by mouth daily.        Marland Kitchen gabapentin (NEURONTIN) 100 MG capsule Take 100 mg by mouth 2 (two) times daily.        . isosorbide mononitrate (IMDUR) 30 MG 24 hr tablet Take 1 tablet (30 mg total) by mouth daily.  30 tablet  11  . KLOR-CON M20 20 MEQ tablet TAKE 1 TABLET ONCE DAILY  30 each  12  . naproxen sodium (ANAPROX) 220 MG tablet Take 220 mg by mouth as needed.        . nitroGLYCERIN (NITROSTAT) 0.4 MG SL tablet Place 1 tablet (0.4 mg total) under the tongue every 5 (five) minutes as needed for chest pain.  25 tablet  3  . warfarin (COUMADIN) 5 MG tablet Take 5 mg by mouth daily. Managed by Dr. Sherril Croon      . zolpidem  (AMBIEN) 10 MG tablet Take 10 mg by mouth at bedtime as needed.           Past Medical History  Diagnosis Date  . Other malaise and fatigue   . Shortness of breath   . Umbilical hernia without mention of obstruction or gangrene   . Other and unspecified hyperlipidemia   . COPD (chronic obstructive pulmonary disease)   . Hypertension   . Cramp of limb   . Atrial flutter     s/p RFCA   . Atrial fibrillation     tikosyn rx  . CAD (coronary artery disease)     s/p CABG 1997;  echo 2008: EF 55-60%     Past Surgical History  Procedure Date  . Appendectomy   . Back surgery   . Cholecystectomy      No family history on file.   History   Social History  . Marital Status: Married    Spouse Name: N/A    Number of Children: N/A  . Years of Education: N/A   Occupational History  . Not on file.   Social History Main Topics  . Smoking status: Former Smoker    Quit date: 07/16/1995  . Smokeless tobacco: Not on file  . Alcohol Use: Not on file  . Drug Use: Not on file  . Sexually Active:  Not on file   Other Topics Concern  . Not on file   Social History Narrative  . No narrative on file     PHYSICAL EXAM   BP 114/66  Pulse 79  Ht 5\' 7"  (1.702 m)  Wt 92.987 kg (205 lb)  BMI 32.11 kg/m2  Constitutional: He is oriented to person, place, and time. He appears well-developed and well-nourished. No distress.  HENT: No nasal discharge.  Head: Normocephalic and atraumatic.  Eyes: Pupils are equal, round, and reactive to light. Right eye exhibits no discharge. Left eye exhibits no discharge.  Neck: Normal range of motion. Neck supple. No JVD present. No thyromegaly present.  Cardiovascular: Normal rate, regular rhythm, normal heart sounds . Exam reveals no gallop and no friction rub.  There is a 3/6 systolic ejection murmur heard best at the aortic area. The murmur is mid peaking with mildly diminished S2. Pulmonary/Chest: Effort normal and breath sounds normal. No  stridor. No respiratory distress. He has no wheezes. He has no rales. He exhibits no tenderness.  Abdominal: Soft. Bowel sounds are normal. He exhibits no distension. There is no tenderness. There is no rebound and no guarding.  Musculoskeletal: Normal range of motion. He exhibits no edema and no tenderness.  Neurological: He is alert and oriented to person, place, and time. Coordination normal.  Skin: Skin is warm and dry. No rash noted. He is not diaphoretic. No erythema. No pallor.  Psychiatric: He has a normal mood and affect. His behavior is normal. Judgment and thought content normal.        ASSESSMENT AND PLAN

## 2011-06-03 NOTE — Patient Instructions (Signed)
Follow up as scheduled. Your physician recommends that you go to the [facility] for lab work: CBC/CMET/TSH Toprol 25 mg Take 1 tablet by mouth daily.

## 2011-06-04 ENCOUNTER — Telehealth: Payer: Self-pay | Admitting: *Deleted

## 2011-06-04 NOTE — Telephone Encounter (Signed)
Left message to call back on voice mail

## 2011-06-04 NOTE — Telephone Encounter (Signed)
Message copied by Arlyss Gandy on Tue Jun 04, 2011  4:16 PM ------      Message from: [name] A      Created: Tue Jun 04, 2011 11:42 AM       Mild anemia and renal failure. Will monitor.

## 2011-06-05 ENCOUNTER — Encounter: Payer: Self-pay | Admitting: Cardiovascular Disease

## 2011-06-05 DIAGNOSIS — I350 Nonrheumatic aortic (valve) stenosis: Secondary | ICD-10-CM

## 2011-06-05 HISTORY — DX: Nonrheumatic aortic (valve) stenosis: I35.0

## 2011-06-05 NOTE — Assessment & Plan Note (Signed)
The patient had an echocardiogram done and was read as severe aortic stenosis with a calculated valve area of 0.92. However, the mean gradient was only 27 mmHg. And also by physical exam the aortic stenosis does not seem to be severe. I do think that this probably is contributing to some of his symptoms. If I proceed with cardiac catheterization, I will likely across the aortic valve to record the gradients and calculated valve area. I don't think the patient would be a good candidate for surgical AVR but he might be a candidate for TAVR.

## 2011-06-05 NOTE — Assessment & Plan Note (Signed)
The patient is maintaining in sinus rhythm with dofetilide. He is on long-term anticoagulation with warfarin.

## 2011-06-05 NOTE — Telephone Encounter (Signed)
Pt notified of results and verbalized understanding  

## 2011-06-05 NOTE — Assessment & Plan Note (Signed)
The patient is status post CABG in 1997. He started having exertional chest pain recently. His symptoms currently are suggestive of class III angina. He underwent evaluation with a nuclear stress test which showed evidence of inferior infarct without significant ischemia. There was no high-risk features. Due to that and also his age, I will try a medical approach first to try to improve his symptoms. We'll reserve coronary angiography for refractory symptoms. He was already started on Imdur were. I will add Toprol XL 25 mg once daily. I will request routine labs on him to make sure that no other reasons for his angina and also to see if he has any significant anemia or renal failure that would put him at higher risk for invasive angiography. I will reevaluate his symptoms in one month and consider cardiac catheterization at that time based on his progress.

## 2011-06-13 ENCOUNTER — Telehealth: Payer: Self-pay | Admitting: Internal Medicine

## 2011-06-13 NOTE — Telephone Encounter (Signed)
Patient c/o sob,when walking over 100 ft, and tightening up in center chest and have to sit down to catch breath. Saw family doctor Vyas on Friday and told him to break fluid pill in half because it could be flushing his kidneys out too much. Dr. Sherril Croon told patient he may need a heart cath. Patient denies chest pain, or dizziness. Patient c/o that Juan Wolf is causing him diarrhea on and off for the past year. Please advise.

## 2011-06-13 NOTE — Telephone Encounter (Signed)
New message: Pt was seen by Lorin Picket but is not feeling any better.  He has had test and lab work.  Please call him back.  He is taking all medication that has been prescribed.  He has chest tightness with any activity.  Very concerned that he is no better. Please call and advise what/who he should see.

## 2011-06-13 NOTE — Telephone Encounter (Signed)
Please add patient to my schedule tomorrow. I can see him Friday afternoon October 30. I'm in the hospital back and come over to see patient. If needed gene can help. Sounds like he has unstable symptoms.

## 2011-06-14 ENCOUNTER — Ambulatory Visit (INDEPENDENT_AMBULATORY_CARE_PROVIDER_SITE_OTHER): Payer: Medicare Other | Admitting: Cardiovascular Disease

## 2011-06-14 ENCOUNTER — Encounter: Payer: Self-pay | Admitting: Cardiovascular Disease

## 2011-06-14 VITALS — BP 131/67 | HR 67 | Ht 67.0 in | Wt 204.0 lb

## 2011-06-14 DIAGNOSIS — I359 Nonrheumatic aortic valve disorder, unspecified: Secondary | ICD-10-CM

## 2011-06-14 DIAGNOSIS — I4891 Unspecified atrial fibrillation: Secondary | ICD-10-CM

## 2011-06-14 DIAGNOSIS — I350 Nonrheumatic aortic (valve) stenosis: Secondary | ICD-10-CM

## 2011-06-14 DIAGNOSIS — I209 Angina pectoris, unspecified: Secondary | ICD-10-CM

## 2011-06-14 DIAGNOSIS — R079 Chest pain, unspecified: Secondary | ICD-10-CM

## 2011-06-14 DIAGNOSIS — I208 Other forms of angina pectoris: Secondary | ICD-10-CM

## 2011-06-14 MED ORDER — ASPIRIN EC 81 MG PO TBEC: 81.0000 mg | DELAYED_RELEASE_TABLET | Freq: Every day | ORAL | Status: DC

## 2011-06-14 MED ORDER — DOFETILIDE 125 MCG PO CAPS: ORAL_CAPSULE | ORAL | Status: DC

## 2011-06-14 NOTE — Assessment & Plan Note (Signed)
The patient had an echocardiogram done and was read as severe aortic stenosis with a calculated valve area of 0.92. However, the mean gradient was only 27 mmHg. And also by physical exam the aortic stenosis does not seem to be severe. I do think that this probably is contributing to some of his symptoms. I will plan to cross the aortic valve to record the gradients and calculate valve area. I don't think the patient would be a good candidate for surgical AVR but he might be a candidate for TAVR.

## 2011-06-14 NOTE — Progress Notes (Signed)
HPI  This is an 77 year old gentleman who is here today for a followup visit. I saw him recently for typical anginal chest discomfort. He has a history of coronary artery bypass graft surgery in 1997 at [facility] with no ischemic event since then. His workup included a nuclear stress test which showed a fixed inferior wall defect. Echocardiogram showed normal LV systolic function with moderate to severe aortic stenosis. Mean gradient was 27 mmHg. Due to his age, I recommended initial medical therapy to try to control his angina. He was started initially on Imdur 30 mg once daily and I then added Toprol 25 mg once daily. The patient had no improvement whatsoever. His symptoms seem actually to be worse. He is having substernal chest tightness with very low level of physical activities even walking up a few steps or going to the mailbox. This is a significant change for him from previous status. He has been independent and active almost all his life. He did have problems with atrial fibrillation in the past. That was controlled with dofetilide. When he was in atrial fibrillation he did have similar symptoms of dyspnea but the chest tightness is new to him. I did routine labs on him which showed a creatinine of 1.7. His previous creatinine was 1.34. Dr. Sherril Croon decreased his Lasix to 20 mg once daily for that reason.  No Known Allergies   Current Outpatient Prescriptions on File Prior to Visit  Medication Sig Dispense Refill  . furosemide (LASIX) 40 MG tablet Take 20 mg by mouth daily.       Marland Kitchen gabapentin (NEURONTIN) 100 MG capsule Take 200 mg by mouth daily.       . isosorbide mononitrate (IMDUR) 30 MG 24 hr tablet Take 1 tablet (30 mg total) by mouth daily.  30 tablet  11  . KLOR-CON M20 20 MEQ tablet TAKE 1 TABLET ONCE DAILY  30 each  12  . lisinopril (PRINIVIL,ZESTRIL) 5 MG tablet Take 1 tablet (5 mg total) by mouth 2 (two) times daily.  30 tablet  11  . metoprolol succinate (TOPROL XL) 25 MG 24 hr  tablet Take 1 tablet (25 mg total) by mouth daily.  30 tablet  6  . nitroGLYCERIN (NITROSTAT) 0.4 MG SL tablet Place 1 tablet (0.4 mg total) under the tongue every 5 (five) minutes as needed for chest pain.  25 tablet  3  . zolpidem (AMBIEN) 10 MG tablet Take 10 mg by mouth at bedtime as needed.        . naproxen sodium (ANAPROX) 220 MG tablet Take 220 mg by mouth as needed.           Past Medical History  Diagnosis Date  . Other malaise and fatigue   . Shortness of breath   . Umbilical hernia without mention of obstruction or gangrene   . Other and unspecified hyperlipidemia   . COPD (chronic obstructive pulmonary disease)   . Hypertension   . Cramp of limb   . Atrial flutter     s/p RFCA   . Atrial fibrillation     tikosyn rx  . CAD (coronary artery disease)     s/p CABG 1997;  echo 2008: EF 55-60%  . Aortic stenosis 06/05/2011  . Chronic kidney disease     CKD     Past Surgical History  Procedure Date  . Appendectomy   . Back surgery   . Cholecystectomy      History reviewed. No pertinent family history.  History   Social History  . Marital Status: Married    Spouse Name: N/A    Number of Children: N/A  . Years of Education: N/A   Occupational History  . Not on file.   Social History Main Topics  . Smoking status: Former Smoker -- 0.3 packs/day for 55 years    Types: Cigars    Quit date: 07/16/1995  . Smokeless tobacco: Former Neurosurgeon    Types: Chew    Quit date: 07/15/1944   Comment: chewed tobacoo for 3 years  . Alcohol Use: Not on file  . Drug Use: Not on file  . Sexually Active: Not on file   Other Topics Concern  . Not on file   Social History Narrative  . No narrative on file     PHYSICAL EXAM   BP 131/67  Pulse 67  Ht 5\' 7"  (1.702 m)  Wt 204 lb (92.534 kg)  BMI 31.95 kg/m2  SpO2 96%  Constitutional: He is oriented to person, place, and time. He appears well-developed and well-nourished. No distress.  HENT: No nasal discharge.    Head: Normocephalic and atraumatic.  Eyes: Pupils are equal, round, and reactive to light. Right eye exhibits no discharge. Left eye exhibits no discharge.  Neck: Normal range of motion. Neck supple. No JVD present. No thyromegaly present.  Cardiovascular: Normal rate, regular rhythm. Exam reveals no gallop and no friction rub.  There is a 3/6 systolic ejection murmur at the aortic area with moderately diminished S2. Pulmonary/Chest: Effort normal and breath sounds normal. No stridor. No respiratory distress. He has no wheezes. He has no rales. He exhibits no tenderness.  Abdominal: Soft. Bowel sounds are normal. He exhibits no distension. There is no tenderness. There is no rebound and no guarding.  Musculoskeletal: Normal range of motion. He exhibits no edema and no tenderness.  Neurological: He is alert and oriented to person, place, and time. Coordination normal.  Skin: Skin is warm and dry. No rash noted. He is not diaphoretic. No erythema. No pallor.  Psychiatric: He has a normal mood and affect. His behavior is normal. Judgment and thought content normal.       EKG: Sinus rhythm with frequent PVCs in the form of bigeminy. I cannot determine if his QT is prolonged due to ventricular bigeminy.   ASSESSMENT AND PLAN

## 2011-06-14 NOTE — Patient Instructions (Addendum)
   Decrease Tikosyn to (2 tabs) twice a day  Right & Left Heart Cath - scheduled for Thursday, 12/6 at 7:30 AM - main cath lab  Admit to [facility] on Wednesday evening, 12/5 for hydration - someone from bed control will call you when bed is available  Hold Warfarin starting today  Begin Aspirin 81mg  daily Follow up will be given at time of discharge from above

## 2011-06-14 NOTE — Assessment & Plan Note (Signed)
The patient unfortunately is having progressive symptoms of exertional chest pain and dyspnea currently with minimal activities. He did not improve at all with medical therapy including Toprol and Imdur were. I discussed with him further management options that include cardiac catheterization. The patient mentioned that her symptoms are significant and interfering with his activities of daily living. He would like something to be done even if there is some risk involved. My concern obviously is at risk of contrast-induced nephropathy due to chronic kidney disease. His creatinine was 1.7. Estimated GFR was 38.  After a prolonged discussion with him, we will proceed with a right and left cardiac catheterization next week. I will admit him the night before to start IV hydration. If PCI is needed, we'll likely use a bare-metal stent due to chronic anticoagulation. Will request his operative report from [facility].

## 2011-06-14 NOTE — Assessment & Plan Note (Addendum)
The patient appears to be in sinus rhythm but with frequent PVCs. Due to worsening renal function, I discussed the case with Dr. Ladona Ridgel regarding adjusting the dose of dofetilide. This will be decreased to 250 mcg twice daily. The patient will also be placed on telemetry while hospitalized for cardiac catheterization to make sure that the arrhythmia is not contributing to some of his symptoms. I will hold Warfarin for planned cardiac cath. The patient had no previous thromboembolic complications and normal EF. Thus, no need to bridge with LMWH.

## 2011-06-14 NOTE — Telephone Encounter (Signed)
Has OV scheduled this afternoon with Dr. Kirke Corin.

## 2011-06-17 ENCOUNTER — Telehealth: Payer: Self-pay | Admitting: *Deleted

## 2011-06-17 NOTE — Telephone Encounter (Signed)
Scheduled for left & right cardiac cath on Thursday, 12/6 with  Dr. Kirke Corin at 7:30.  Patient to be admitted on the evening of Wednesday, 12/5 for hydration.

## 2011-06-17 NOTE — Telephone Encounter (Signed)
Notification # [id number] for planned admit for hydration on 06/19/11  Auth # [id number] exp 08/01/11 for Right and Left Heart Cath 06/20/11

## 2011-06-19 ENCOUNTER — Encounter (HOSPITAL_COMMUNITY): Payer: Self-pay | Admitting: General Practice

## 2011-06-19 ENCOUNTER — Inpatient Hospital Stay (HOSPITAL_COMMUNITY)
Admission: AD | Admit: 2011-06-19 | Discharge: 2011-06-21 | DRG: 249 | Disposition: A | Discharge status: Home / Self Care | Payer: Medicare Other | Source: Ambulatory Visit | Attending: Cardiovascular Disease | Admitting: Cardiovascular Disease

## 2011-06-19 ENCOUNTER — Inpatient Hospital Stay (HOSPITAL_COMMUNITY): Payer: Medicare Other

## 2011-06-19 DIAGNOSIS — I359 Nonrheumatic aortic valve disorder, unspecified: Secondary | ICD-10-CM | POA: Diagnosis present

## 2011-06-19 DIAGNOSIS — I129 Hypertensive chronic kidney disease with stage 1 through stage 4 chronic kidney disease, or unspecified chronic kidney disease: Secondary | ICD-10-CM | POA: Diagnosis present

## 2011-06-19 DIAGNOSIS — I350 Nonrheumatic aortic (valve) stenosis: Secondary | ICD-10-CM

## 2011-06-19 DIAGNOSIS — I208 Other forms of angina pectoris: Secondary | ICD-10-CM

## 2011-06-19 DIAGNOSIS — K429 Umbilical hernia without obstruction or gangrene: Secondary | ICD-10-CM | POA: Diagnosis present

## 2011-06-19 DIAGNOSIS — E785 Hyperlipidemia, unspecified: Secondary | ICD-10-CM | POA: Diagnosis present

## 2011-06-19 DIAGNOSIS — I4892 Unspecified atrial flutter: Secondary | ICD-10-CM | POA: Diagnosis present

## 2011-06-19 DIAGNOSIS — J449 Chronic obstructive pulmonary disease, unspecified: Secondary | ICD-10-CM | POA: Diagnosis present

## 2011-06-19 DIAGNOSIS — Z87891 Personal history of nicotine dependence: Secondary | ICD-10-CM

## 2011-06-19 DIAGNOSIS — I2510 Atherosclerotic heart disease of native coronary artery without angina pectoris: Principal | ICD-10-CM | POA: Diagnosis present

## 2011-06-19 DIAGNOSIS — Z7902 Long term (current) use of antithrombotics/antiplatelets: Secondary | ICD-10-CM

## 2011-06-19 DIAGNOSIS — M1990 Unspecified osteoarthritis, unspecified site: Secondary | ICD-10-CM | POA: Diagnosis present

## 2011-06-19 DIAGNOSIS — R079 Chest pain, unspecified: Secondary | ICD-10-CM

## 2011-06-19 DIAGNOSIS — Z951 Presence of aortocoronary bypass graft: Secondary | ICD-10-CM

## 2011-06-19 DIAGNOSIS — N189 Chronic kidney disease, unspecified: Secondary | ICD-10-CM | POA: Diagnosis present

## 2011-06-19 DIAGNOSIS — J4489 Other specified chronic obstructive pulmonary disease: Secondary | ICD-10-CM | POA: Diagnosis present

## 2011-06-19 DIAGNOSIS — K219 Gastro-esophageal reflux disease without esophagitis: Secondary | ICD-10-CM | POA: Diagnosis present

## 2011-06-19 DIAGNOSIS — Z7982 Long term (current) use of aspirin: Secondary | ICD-10-CM

## 2011-06-19 DIAGNOSIS — I4891 Unspecified atrial fibrillation: Secondary | ICD-10-CM | POA: Diagnosis present

## 2011-06-19 HISTORY — DX: Other complications of anesthesia, initial encounter: T88.59XA

## 2011-06-19 HISTORY — PX: CARDIAC CATHETERIZATION: SHX172

## 2011-06-19 HISTORY — DX: Unspecified osteoarthritis, unspecified site: M19.90

## 2011-06-19 HISTORY — PX: CORONARY ANGIOPLASTY: SHX604

## 2011-06-19 HISTORY — DX: Adverse effect of unspecified anesthetic, initial encounter: T41.45XA

## 2011-06-19 HISTORY — DX: Acute myocardial infarction, unspecified: I21.9

## 2011-06-19 HISTORY — DX: Gastro-esophageal reflux disease without esophagitis: K21.9

## 2011-06-19 LAB — COMPREHENSIVE METABOLIC PANEL
ALT: 11 U/L (ref 0–53)
AST: 18 U/L (ref 0–37)
Albumin: 3.7 g/dL (ref 3.5–5.2)
CO2: 24 mEq/L (ref 19–32)
Chloride: 106 mEq/L (ref 96–112)
Creatinine, Ser: 1.34 mg/dL (ref 0.50–1.35)
Sodium: 140 mEq/L (ref 135–145)
Total Bilirubin: 0.8 mg/dL (ref 0.3–1.2)

## 2011-06-19 LAB — CARDIAC PANEL(CRET KIN+CKTOT+MB+TROPI)
CK, MB: 4 ng/mL (ref 0.3–4.0)
Relative Index: INVALID (ref 0.0–2.5)
Relative Index: INVALID (ref 0.0–2.5)
Total CK: 86 U/L (ref 7–232)
Total CK: 84 U/L (ref 7–232)
Troponin I: <0.3 ng/mL (ref <0.30)

## 2011-06-19 LAB — DIFFERENTIAL
Basophils Absolute: 0 10*3/uL (ref 0.0–0.1)
Eosinophils Relative: 1 % (ref 0–5)
Lymphocytes Relative: 31 % (ref 12–46)
Lymphs Abs: 2.4 10*3/uL (ref 0.7–4.0)
Neutro Abs: 4.6 10*3/uL (ref 1.7–7.7)
Neutrophils Relative %: 59 % (ref 43–77)

## 2011-06-19 LAB — HEMOGLOBIN A1C: Mean Plasma Glucose: 120 mg/dL — ABNORMAL HIGH (ref <117)

## 2011-06-19 LAB — APTT: aPTT: 30 seconds (ref 24–37)

## 2011-06-19 LAB — CBC
HCT: 35.1 % — ABNORMAL LOW (ref 39.0–52.0)
MCHC: 32.8 g/dL (ref 30.0–36.0)
Platelets: 160 10*3/uL (ref 150–400)
RDW: 14.3 % (ref 11.5–15.5)
WBC: 7.8 10*3/uL (ref 4.0–10.5)

## 2011-06-19 LAB — PROTIME-INR: INR: 1.1 (ref 0.00–1.49)

## 2011-06-19 LAB — TSH: TSH: 1.608 u[IU]/mL (ref 0.350–4.500)

## 2011-06-19 MED ORDER — ZOLPIDEM TARTRATE 5 MG PO TABS
10.0000 mg | ORAL_TABLET | Freq: Every evening | ORAL | Status: DC | PRN
  Administered 2011-06-19: 10 mg via ORAL
  Filled 2011-06-19: qty 2

## 2011-06-19 MED ORDER — ALPRAZOLAM 0.25 MG PO TABS: 0.2500 mg | ORAL_TABLET | Freq: Two times a day (BID) | ORAL | Status: DC | PRN

## 2011-06-19 MED ORDER — SODIUM CHLORIDE 0.9 % IV SOLN: 250.0000 mL | INTRAVENOUS | Status: DC | PRN

## 2011-06-19 MED ORDER — METOPROLOL SUCCINATE ER 25 MG PO TB24
25.0000 mg | ORAL_TABLET | Freq: Every day | ORAL | Status: DC
  Administered 2011-06-19 – 2011-06-20 (×2): 25 mg via ORAL
  Filled 2011-06-19 (×3): qty 1

## 2011-06-19 MED ORDER — HEPARIN SOD (PORCINE) IN D5W 100 UNIT/ML IV SOLN
1100.0000 [IU]/h | INTRAVENOUS | Status: DC
  Administered 2011-06-19: 1000 [IU]/h via INTRAVENOUS
  Filled 2011-06-19 (×4): qty 250

## 2011-06-19 MED ORDER — FUROSEMIDE 20 MG PO TABS
20.0000 mg | ORAL_TABLET | Freq: Every day | ORAL | Status: DC
  Filled 2011-06-19 (×2): qty 1

## 2011-06-19 MED ORDER — ZOLPIDEM TARTRATE 5 MG PO TABS: 5.0000 mg | ORAL_TABLET | Freq: Every evening | ORAL | Status: DC | PRN

## 2011-06-19 MED ORDER — ACETAMINOPHEN 325 MG PO TABS
650.0000 mg | ORAL_TABLET | ORAL | Status: DC | PRN
Start: 2011-06-19 — End: 2011-06-21

## 2011-06-19 MED ORDER — METOPROLOL SUCCINATE ER 25 MG PO TB24
25.0000 mg | ORAL_TABLET | Freq: Every day | ORAL | Status: DC
  Filled 2011-06-19: qty 1

## 2011-06-19 MED ORDER — ISOSORBIDE MONONITRATE ER 30 MG PO TB24
30.0000 mg | ORAL_TABLET | Freq: Every day | ORAL | Status: DC
  Filled 2011-06-19 (×2): qty 1

## 2011-06-19 MED ORDER — NITROGLYCERIN 0.4 MG SL SUBL: 0.4000 mg | SUBLINGUAL_TABLET | SUBLINGUAL | Status: DC | PRN

## 2011-06-19 MED ORDER — ASPIRIN 81 MG PO CHEW
324.0000 mg | CHEWABLE_TABLET | ORAL | Status: AC
  Administered 2011-06-20: 324 mg via ORAL
  Filled 2011-06-19: qty 4

## 2011-06-19 MED ORDER — GABAPENTIN 100 MG PO CAPS
200.0000 mg | ORAL_CAPSULE | Freq: Every day | ORAL | Status: DC
  Administered 2011-06-19 – 2011-06-20 (×2): 200 mg via ORAL
  Filled 2011-06-19 (×5): qty 2

## 2011-06-19 MED ORDER — SODIUM CHLORIDE 0.9 % IJ SOLN: 3.0000 mL | INTRAMUSCULAR | Status: DC | PRN

## 2011-06-19 MED ORDER — ASPIRIN EC 81 MG PO TBEC
81.0000 mg | DELAYED_RELEASE_TABLET | Freq: Every day | ORAL | Status: DC
  Administered 2011-06-21: 81 mg via ORAL
  Filled 2011-06-19: qty 1

## 2011-06-19 MED ORDER — POTASSIUM CHLORIDE CRYS ER 20 MEQ PO TBCR
20.0000 meq | EXTENDED_RELEASE_TABLET | Freq: Every day | ORAL | Status: DC
  Administered 2011-06-21: 20 meq via ORAL
  Filled 2011-06-19 (×3): qty 1

## 2011-06-19 MED ORDER — LISINOPRIL 5 MG PO TABS
5.0000 mg | ORAL_TABLET | Freq: Two times a day (BID) | ORAL | Status: DC
  Administered 2011-06-19 – 2011-06-21 (×3): 5 mg via ORAL
  Filled 2011-06-19 (×6): qty 1

## 2011-06-19 MED ORDER — DOFETILIDE 125 MCG PO CAPS
125.0000 ug | ORAL_CAPSULE | Freq: Two times a day (BID) | ORAL | Status: DC
  Administered 2011-06-19: 125 ug via ORAL
  Filled 2011-06-19 (×4): qty 1

## 2011-06-19 MED ORDER — SODIUM CHLORIDE 0.9 % IJ SOLN
3.0000 mL | Freq: Two times a day (BID) | INTRAMUSCULAR | Status: DC
  Administered 2011-06-19 – 2011-06-21 (×2): 3 mL via INTRAVENOUS

## 2011-06-19 MED ORDER — GABAPENTIN 100 MG PO CAPS
200.0000 mg | ORAL_CAPSULE | Freq: Every day | ORAL | Status: DC
  Filled 2011-06-19: qty 2

## 2011-06-19 MED ORDER — SODIUM CHLORIDE 0.9 % IJ SOLN: 3.0000 mL | Freq: Two times a day (BID) | INTRAMUSCULAR | Status: DC

## 2011-06-19 MED ORDER — HEPARIN BOLUS VIA INFUSION
4000.0000 [IU] | Freq: Once | INTRAVENOUS | Status: AC
  Administered 2011-06-19: 4000 [IU] via INTRAVENOUS
  Filled 2011-06-19: qty 4000

## 2011-06-19 MED ORDER — ONDANSETRON HCL 4 MG/2ML IJ SOLN: 4.0000 mg | Freq: Four times a day (QID) | INTRAMUSCULAR | Status: DC | PRN

## 2011-06-19 MED ORDER — SODIUM CHLORIDE 0.9 % IV SOLN
1.0000 mL/kg/h | INTRAVENOUS | Status: DC
  Administered 2011-06-19 – 2011-06-20 (×2): 1 mL/kg/h via INTRAVENOUS

## 2011-06-19 NOTE — Progress Notes (Signed)
ANTICOAGULATION CONSULT NOTE - Initial Consult  Pharmacy Consult for Heparin Indication: chest pain/ACS  No Known Allergies  Patient Measurements: Height: 5\' 7"  (170.2 cm) Weight: 198 lb 10.2 oz (90.1 kg) IBW/kg (Calculated) : 66.1  Heparin dosing weight: 85 kg  Vital Signs: Temp: 98 F (36.7 C) (12/05 1340) Temp src: Oral (12/05 1340) BP: 131/64 mmHg (12/05 1340) Pulse Rate: 66  (12/05 1340)  Labs:  Basename 06/19/11 1449  HGB 11.5*  HCT 35.1*  PLT 160  APTT --  LABPROT --  INR --  HEPARINUNFRC --  CREATININE --  CKTOTAL --  CKMB --  TROPONINI --   Estimated Creatinine Clearance: 42.9 ml/min (by C-G formula based on Cr of 1.3).  Medical History: Past Medical History  Diagnosis Date  . Other malaise and fatigue   . Shortness of breath   . Umbilical hernia without mention of obstruction or gangrene   . Other and unspecified hyperlipidemia   . COPD (chronic obstructive pulmonary disease)   . Hypertension   . Cramp of limb   . Atrial flutter     s/p RFCA   . Atrial fibrillation     tikosyn rx  . CAD (coronary artery disease)     s/p CABG 1997;  echo 2008: EF 55-60%  . Aortic stenosis 06/05/2011  . Chronic kidney disease     CKD  . Complication of anesthesia     " The doctor told me not to let anyone put me to sleep"  . Myocardial infarction   . GERD (gastroesophageal reflux disease)   . Arthritis     Medications:  Prescriptions prior to admission  Medication Sig Dispense Refill  . aspirin 81 MG chewable tablet Chew 81 mg by mouth daily.        Marland Kitchen dofetilide (TIKOSYN) 125 MCG capsule Take 375 mcg by mouth 2 (two) times daily.        . furosemide (LASIX) 40 MG tablet Take 40 mg by mouth daily.        Marland Kitchen gabapentin (NEURONTIN) 100 MG capsule Take 200 mg by mouth daily.       . isosorbide mononitrate (IMDUR) 30 MG 24 hr tablet Take 30 mg by mouth daily.        Marland Kitchen lisinopril (PRINIVIL,ZESTRIL) 5 MG tablet Take 5 mg by mouth 2 (two) times daily.        .  metoprolol succinate (TOPROL-XL) 25 MG 24 hr tablet Take 25 mg by mouth daily.        . nitroGLYCERIN (NITROSTAT) 0.4 MG SL tablet Place 0.4 mg under the tongue every 5 (five) minutes as needed. For chest pain       . potassium chloride SA (K-DUR,KLOR-CON) 20 MEQ tablet Take 20 mEq by mouth daily.        Marland Kitchen zolpidem (AMBIEN) 10 MG tablet Take 10 mg by mouth at bedtime as needed. For sleep         Assessment: 77 y.o. Male presents for cath to r/o ACS - to receive hydration overnight. Pt on coumadin PTA for afib but stopped last Friday - ?stopped for elective cath procedure?. INR today 1.1. To begin heparin gtt.   Goal of Therapy:  Heparin level 0.3-0.7 units/ml   Plan:  Heparin bolus 4000 units IV Heparin gtt at 1000 units/hr Check 8 hr heparin level Daily heparin level and CBC  Tahir Blank, Hilario Quarry 06/19/2011,3:22 PM

## 2011-06-19 NOTE — H&P (Signed)
History and Physical  Patient ID: Juan Wolf MRN: [id number] DOB/AGE: [birth year] 77 y.o. Admit date: 06/19/2011  Primary Care Physician: Dr. Sherril Croon In [place].  Primary Cardiologist Dr. Kirke Corin  HPI: HPI  This is an 77 year old gentleman who is here today for IV hydration for cardiac cath tomorrow. I saw him recently for typical anginal chest discomfort. He has a history of coronary artery bypass graft surgery in 1997 at [facility] with no ischemic event since then. His workup included a nuclear stress test which showed a fixed inferior wall defect. Echocardiogram showed normal LV systolic function with moderate to severe aortic stenosis. Mean gradient was 27 mmHg. Due to his age, I recommended initial medical therapy to try to control his angina. He was started initially on Imdur 30 mg once daily and I then added Toprol 25 mg once daily. The patient had no improvement whatsoever. His symptoms seem actually to be worse. He is having substernal chest tightness with very low level of physical activities even walking up a few steps or going to the mailbox. This is a significant change for him from previous status. He has been independent and active almost all his life. He did have problems with atrial fibrillation in the past. That was controlled with dofetilide. When he was in atrial fibrillation he did have similar symptoms of dyspnea but the chest tightness is new to him.  I did routine labs on him which showed a creatinine of 1.7. His previous creatinine was 1.34. Dr. Sherril Croon decreased his Lasix to 20 mg once daily for that reason.   Review of systems complete and found to be negative unless listed above  Past Medical History  Diagnosis Date  . Other malaise and fatigue   . Shortness of breath   . Umbilical hernia without mention of obstruction or gangrene   . Other and unspecified hyperlipidemia   . COPD (chronic obstructive pulmonary disease)   . Hypertension   . Cramp of limb   . Atrial  flutter     s/p RFCA   . Atrial fibrillation     tikosyn rx  . CAD (coronary artery disease)     s/p CABG 1997;  echo 2008: EF 55-60%  . Aortic stenosis 06/05/2011  . Chronic kidney disease     CKD  . Complication of anesthesia     " The doctor told me not to let anyone put me to sleep"  . Myocardial infarction   . GERD (gastroesophageal reflux disease)   . Arthritis     History reviewed. No pertinent family history.  History   Social History  . Marital Status: Married    Spouse Name: N/A    Number of Children: N/A  . Years of Education: N/A   Occupational History  . Not on file.   Social History Main Topics  . Smoking status: Former Smoker -- 0.3 packs/day for 55 years    Types: Cigars    Quit date: 07/16/1995  . Smokeless tobacco: Former Neurosurgeon    Types: Chew    Quit date: 07/15/1944   Comment: chewed tobacoo for 3 years  . Alcohol Use: No  . Drug Use: No  . Sexually Active: No   Other Topics Concern  . Not on file   Social History Narrative  . No narrative on file    Past Surgical History  Procedure Date  . Appendectomy   . Back surgery   . Cholecystectomy   . Hernia repair  Prescriptions prior to admission  Medication Sig Dispense Refill  . aspirin 81 MG chewable tablet Chew 81 mg by mouth daily.        Marland Kitchen dofetilide (TIKOSYN) 125 MCG capsule Take 375 mcg by mouth 2 (two) times daily.        . furosemide (LASIX) 40 MG tablet Take 40 mg by mouth daily.        Marland Kitchen gabapentin (NEURONTIN) 100 MG capsule Take 200 mg by mouth daily.       . isosorbide mononitrate (IMDUR) 30 MG 24 hr tablet Take 30 mg by mouth daily.        Marland Kitchen lisinopril (PRINIVIL,ZESTRIL) 5 MG tablet Take 5 mg by mouth 2 (two) times daily.        . metoprolol succinate (TOPROL-XL) 25 MG 24 hr tablet Take 25 mg by mouth daily.        . nitroGLYCERIN (NITROSTAT) 0.4 MG SL tablet Place 0.4 mg under the tongue every 5 (five) minutes as needed. For chest pain       . potassium chloride SA  (K-DUR,KLOR-CON) 20 MEQ tablet Take 20 mEq by mouth daily.        Marland Kitchen zolpidem (AMBIEN) 10 MG tablet Take 10 mg by mouth at bedtime as needed. For sleep         Physical Exam: Blood pressure 131/64, pulse 66, temperature 98 F (36.7 C), temperature source Oral, resp. rate 20, height 5\' 7"  (1.702 m), weight 90.1 kg (198 lb 10.2 oz), SpO2 97.00%.   Constitutional: He is oriented to person, place, and time. He appears well-developed and well-nourished. No distress.  HENT: No nasal discharge.  Head: Normocephalic and atraumatic.  Eyes: Pupils are equal, round, and reactive to light. Right eye exhibits no discharge. Left eye exhibits no discharge.  Neck: Normal range of motion. Neck supple. No JVD present. No thyromegaly present.  Cardiovascular: Normal rate, regular rhythm. Exam reveals no gallop and no friction rub.  There is a 3/6 systolic ejection murmur at the aortic area with moderately diminished S2.  Pulmonary/Chest: Effort normal and breath sounds normal. No stridor. No respiratory distress. He has no wheezes. He has no rales. He exhibits no tenderness.  Abdominal: Soft. Bowel sounds are normal. He exhibits no distension. There is no tenderness. There is no rebound and no guarding.  Musculoskeletal: Normal range of motion. He exhibits no edema and no tenderness.  Neurological: He is alert and oriented to person, place, and time. Coordination normal.  Skin: Skin is warm and dry. No rash noted. He is not diaphoretic. No erythema. No pallor.  Psychiatric: He has a normal mood and affect. His behavior is normal. Judgment and thought content normal.      Labs:   Lab Results  Component Value Date   WBC 7.8 06/19/2011   HGB 11.5* 06/19/2011   HCT 35.1* 06/19/2011   MCV 92.6 06/19/2011   PLT 160 06/19/2011    Lab 06/19/11 1449  NA 140  K 4.5  CL 106  CO2 24  BUN 25*  CREATININE 1.34  CALCIUM 9.4  PROT 7.5  BILITOT 0.8  ALKPHOS 77  ALT 11  AST 18  GLUCOSE 85   Lab Results    Component Value Date   CKTOTAL 86 06/19/2011   CKMB 4.0 06/19/2011   TROPONINI <0.30 06/19/2011    No results found for this basename: CHOL   No results found for this basename: HDL   No results found for this basename: LDLCALC  No results found for this basename: TRIG   No results found for this basename: CHOLHDL   No results found for this basename: LDLDIRECT        ASSESSMENT AND PLAN:   1. Chest pain on exertion  The patient unfortunately is having progressive symptoms of exertional chest pain and dyspnea currently with minimal activities. He did not improve at all with medical therapy including Toprol and Imdur were.  I discussed with him further management options that include cardiac catheterization. The patient mentioned that her symptoms are significant and interfering with his activities of daily living. He would like something to be done even if there is some risk involved. My concern obviously is at risk of contrast-induced nephropathy due to chronic kidney disease. His creatinine was 1.7. Estimated GFR was 38.  we will proceed with a right and left cardiac catheterization next week. Start IV hydration. If PCI is needed, we'll likely use a bare-metal stent due to chronic anticoagulation. Will request his operative report from [facility].  2. Aortic stenosis -  The patient had an echocardiogram done and was read as severe aortic stenosis with a calculated valve area of 0.92. However, the mean gradient was only 27 mmHg. And also by physical exam the aortic stenosis does not seem to be severe. I do think that this probably is contributing to some of his symptoms. I will plan to cross the aortic valve to record the gradients and calculate valve area. I don't think the patient would be a good candidate for surgical AVR but he might be a candidate for TAVR.   3. ATRIAL FIBRILLATION -  The patient appears to be in sinus rhythm but with frequent PVCs. Due to worsening renal  function, I discussed the case with Dr. Ladona Ridgel regarding adjusting the dose of dofetilide. This will be decreased to 250 mcg twice daily.  The patient will also be placed on telemetry. Warfarin is on hold for planned cardiac cath. Start IV Heparin. The patient had no previous thromboembolic complications and normal EF.    Signed: [name] 06/19/2011, 5:25 PM

## 2011-06-20 ENCOUNTER — Encounter (HOSPITAL_COMMUNITY)
Admission: AD | Disposition: A | Discharge status: Home / Self Care | Payer: Self-pay | Source: Ambulatory Visit | Attending: Cardiovascular Disease

## 2011-06-20 ENCOUNTER — Other Ambulatory Visit: Payer: Self-pay

## 2011-06-20 ENCOUNTER — Ambulatory Visit (HOSPITAL_COMMUNITY): Admission: RE | Admit: 2011-06-20 | Payer: Medicare Other | Source: Ambulatory Visit | Admitting: Cardiovascular Disease

## 2011-06-20 DIAGNOSIS — I25810 Atherosclerosis of coronary artery bypass graft(s) without angina pectoris: Secondary | ICD-10-CM

## 2011-06-20 HISTORY — PX: LEFT AND RIGHT HEART CATHETERIZATION WITH CORONARY/GRAFT ANGIOGRAM: SHX5448

## 2011-06-20 LAB — HEPARIN LEVEL (UNFRACTIONATED)
Heparin Unfractionated: 0.32 IU/mL (ref 0.30–0.70)
Heparin Unfractionated: 0.19 IU/mL — ABNORMAL LOW (ref 0.30–0.70)

## 2011-06-20 LAB — CARDIAC PANEL(CRET KIN+CKTOT+MB+TROPI)
CK, MB: 3.8 ng/mL (ref 0.3–4.0)
Relative Index: INVALID (ref 0.0–2.5)
Total CK: 74 U/L (ref 7–232)
Troponin I: <0.3 ng/mL (ref <0.30)

## 2011-06-20 LAB — CBC
Hemoglobin: 12.5 g/dL — ABNORMAL LOW (ref 13.0–17.0)
MCHC: 31.2 g/dL (ref 30.0–36.0)
RBC: 4.3 MIL/uL (ref 4.22–5.81)
WBC: 7.9 10*3/uL (ref 4.0–10.5)

## 2011-06-20 LAB — POCT I-STAT 3, ART BLOOD GAS (G3+)
Bicarbonate: 21.6 mEq/L (ref 20.0–24.0)
O2 Saturation: 94 %
TCO2: 23 mmol/L (ref 0–100)
pCO2 arterial: 38.1 mmHg (ref 35.0–45.0)
pH, Arterial: 7.362 (ref 7.350–7.450)

## 2011-06-20 LAB — POCT I-STAT 3, VENOUS BLOOD GAS (G3P V)
Acid-base deficit: 3 mmol/L — ABNORMAL HIGH (ref 0.0–2.0)
Bicarbonate: 22.5 mEq/L (ref 20.0–24.0)
O2 Saturation: 63 %
TCO2: 24 mmol/L (ref 0–100)

## 2011-06-20 SURGERY — LEFT AND RIGHT HEART CATHETERIZATION WITH CORONARY/GRAFT ANGIOGRAM
Anesthesia: LOCAL

## 2011-06-20 MED ORDER — ASPIRIN 81 MG PO CHEW: 324.0000 mg | CHEWABLE_TABLET | ORAL | Status: DC

## 2011-06-20 MED ORDER — LISINOPRIL 5 MG PO TABS
5.0000 mg | ORAL_TABLET | Freq: Two times a day (BID) | ORAL | Status: DC
  Filled 2011-06-20: qty 1

## 2011-06-20 MED ORDER — LIDOCAINE HCL (PF) 1 % IJ SOLN
INTRAMUSCULAR | Status: AC
  Filled 2011-06-20: qty 30

## 2011-06-20 MED ORDER — DOFETILIDE 250 MCG PO CAPS
250.0000 ug | ORAL_CAPSULE | Freq: Two times a day (BID) | ORAL | Status: DC
  Administered 2011-06-20 – 2011-06-21 (×2): 250 ug via ORAL
  Filled 2011-06-20 (×3): qty 1

## 2011-06-20 MED ORDER — MIDAZOLAM HCL 2 MG/2ML IJ SOLN
INTRAMUSCULAR | Status: AC
  Filled 2011-06-20: qty 2

## 2011-06-20 MED ORDER — NITROGLYCERIN IN D5W 200-5 MCG/ML-% IV SOLN: 2.0000 ug/min | INTRAVENOUS | Status: DC

## 2011-06-20 MED ORDER — POTASSIUM CHLORIDE CRYS ER 20 MEQ PO TBCR: 20.0000 meq | EXTENDED_RELEASE_TABLET | Freq: Every day | ORAL | Status: DC

## 2011-06-20 MED ORDER — FUROSEMIDE 40 MG PO TABS
40.0000 mg | ORAL_TABLET | Freq: Every day | ORAL | Status: DC
  Administered 2011-06-20 – 2011-06-21 (×2): 40 mg via ORAL
  Filled 2011-06-20 (×2): qty 1

## 2011-06-20 MED ORDER — CLOPIDOGREL BISULFATE 300 MG PO TABS
ORAL_TABLET | ORAL | Status: AC
  Filled 2011-06-20: qty 2

## 2011-06-20 MED ORDER — ADENOSINE 6 MG/2ML IV SOLN
INTRAVENOUS | Status: AC
  Filled 2011-06-20: qty 2

## 2011-06-20 MED ORDER — BIVALIRUDIN 250 MG IV SOLR
INTRAVENOUS | Status: AC
  Filled 2011-06-20: qty 250

## 2011-06-20 MED ORDER — DOFETILIDE 250 MCG PO CAPS: 250.0000 ug | ORAL_CAPSULE | Freq: Two times a day (BID) | ORAL | Status: DC

## 2011-06-20 MED ORDER — FENTANYL CITRATE 0.05 MG/ML IJ SOLN
INTRAMUSCULAR | Status: AC
  Filled 2011-06-20: qty 2

## 2011-06-20 MED ORDER — WARFARIN SODIUM 5 MG PO TABS
5.0000 mg | ORAL_TABLET | Freq: Every day | ORAL | Status: DC
  Administered 2011-06-20 – 2011-06-21 (×2): 5 mg via ORAL
  Filled 2011-06-20 (×2): qty 1

## 2011-06-20 MED ORDER — ASPIRIN 81 MG PO CHEW: 81.0000 mg | CHEWABLE_TABLET | Freq: Every day | ORAL | Status: DC

## 2011-06-20 MED ORDER — METOPROLOL SUCCINATE ER 25 MG PO TB24
25.0000 mg | ORAL_TABLET | Freq: Every day | ORAL | Status: DC
  Filled 2011-06-20: qty 1

## 2011-06-20 MED ORDER — FENTANYL CITRATE 0.05 MG/ML IJ SOLN
50.0000 ug | INTRAMUSCULAR | Status: DC | PRN
  Administered 2011-06-20: 50 ug via INTRAVENOUS

## 2011-06-20 MED ORDER — HEPARIN (PORCINE) IN NACL 2-0.9 UNIT/ML-% IJ SOLN
INTRAMUSCULAR | Status: AC
  Filled 2011-06-20: qty 2000

## 2011-06-20 MED ORDER — NITROGLYCERIN 0.2 MG/ML ON CALL CATH LAB
INTRAVENOUS | Status: AC
  Filled 2011-06-20: qty 1

## 2011-06-20 MED ORDER — CLOPIDOGREL BISULFATE 75 MG PO TABS
75.0000 mg | ORAL_TABLET | Freq: Every day | ORAL | Status: DC
  Administered 2011-06-21: 75 mg via ORAL
  Filled 2011-06-20 (×2): qty 1

## 2011-06-20 MED ORDER — ISOSORBIDE MONONITRATE ER 30 MG PO TB24
30.0000 mg | ORAL_TABLET | Freq: Every day | ORAL | Status: DC
  Administered 2011-06-21: 30 mg via ORAL
  Filled 2011-06-20: qty 1

## 2011-06-20 MED ORDER — ISOSORBIDE MONONITRATE ER 30 MG PO TB24
30.0000 mg | ORAL_TABLET | Freq: Every day | ORAL | Status: DC
  Filled 2011-06-20: qty 1

## 2011-06-20 MED ORDER — NITROGLYCERIN 0.4 MG SL SUBL: 0.4000 mg | SUBLINGUAL_TABLET | SUBLINGUAL | Status: DC | PRN

## 2011-06-20 MED ORDER — SODIUM CHLORIDE 0.9 % IV SOLN: INTRAVENOUS | Status: AC

## 2011-06-20 NOTE — Progress Notes (Signed)
ANTICOAGULATION CONSULT NOTE - Initial Consult  Pharmacy Consult for Heparin Indication: chest pain/ACS  No Known Allergies  Patient Measurements: Height: 5\' 7"  (170.2 cm) Weight: 198 lb 10.2 oz (90.1 kg) IBW/kg (Calculated) : 66.1  Heparin dosing weight: 85 kg  Vital Signs: Temp: 98 F (36.7 C) (12/05 2156) Temp src: Oral (12/05 2156) BP: 125/56 mmHg (12/05 2156) Pulse Rate: 67  (12/05 2156)  Labs:  Basename 06/19/11 2312 06/19/11 1818 06/19/11 1449  HGB -- -- 11.5*  HCT -- -- 35.1*  PLT -- -- 160  APTT -- -- 30  LABPROT -- -- 14.4  INR -- -- 1.10  HEPARINUNFRC 0.32 -- --  CREATININE -- -- 1.34  CKTOTAL 74 84 86  CKMB 3.8 3.9 4.0  TROPONINI <0.30 <0.30 <0.30   Estimated Creatinine Clearance: 41.6 ml/min (by C-G formula based on Cr of 1.34).   Assessment: 77 y.o. Male with h/o Afib, awaiting cath in am, for Heparin   Goal of Therapy:  Heparin level 0.3-0.7 units/ml   Plan:  Increase Heparin 1100 units/hr to keep in range.  Eddie Candle 06/20/2011,1:02 AM

## 2011-06-20 NOTE — Op Note (Signed)
Cardiac Catheterization Procedure Note  Name: Juan Wolf MRN: [id number] DOB: 1922-04-19  Procedure: Right Heart Cath, Left Heart Cath, Selective Coronary Angiography, SVG angiography, LIMA angiography, direct stenting of mid SVG to RCA with a bare-metal stent using a distal protection device , direct stenting of proximal SVG to LAD and diagonal with a bare-metal stent using a distal protection device.   Total contrast volume: 105 ML for both diagnostic and interventional procedures.  Indication:   this is an 77 year old gentleman who was evaluated recently for new onset anginal symptoms. He has known history of coronary artery disease status post coronary artery bypass graft surgery in 1997.Marland Kitchen He is known to have aortic stenosis. Echocardiogram suggested moderate to severe aortic stenosis with normal LV systolic function. The patient was initially tried on intensive medical therapy with a beta blocker and long-acting nitroglycerin. However, he continued to have exertional chest tightness with minimal activities. The patient has chronic kidney disease and we discussed different management options. The patient clearly wanted something done as he could not go on with his anginal symptoms. He was willing to take a risk of contrast-induced nephropathy. I decided to admit him overnight for IV hydration with plans for a right and left cardiac catheterization.   Procedural Details: The right groin was prepped, draped, and anesthetized with 1% lidocaine. Using the modified Seldinger technique a 6 French sheath was placed in the right femoral artery and a 7 French sheath was placed in the right femoral vein. A Swan-Ganz catheter was used for the right heart catheterization. Standard protocol was followed for recording of right heart pressures and sampling of oxygen saturations. Fick cardiac output was calculated. Standard Judkins catheters were used for selective coronary angiography and left ventriculography.   SVG angiography was performed with a right Judkins catheter. LIMA angiography was performed with an IM catheter. There were no immediate procedural complications. The patient was transferred to the post catheterization recovery area for further monitoring.  Procedural Findings: Hemodynamics RA 13 mmHg  RV 63/7 mmHg  PA 57/20/37 mmHg  PCWP 22 mmHg  LV  155/14. EDP: 22 mmHg  AO  145/45 mmHg.   Oxygen saturations: PA  63%  AO  94%   Cardiac Output (Fick)  5.1   Cardiac Index (Fick)  2.52    aortic valve calculations: Peak to peak gradient was 10 millimeters mercury with a mean gradient of 27 mmHg. Calculated aortic valve area is 1.05 cm. There was no difficulty at all in crossing the aortic valve.   Coronary angiography: Coronary dominance: right  Left mainstem:  the vessel is normal in size and calcified. There is diffuse mild disease.   Left anterior descending (LAD):  The native vesselis heavily calcified and is occluded in the midsegment.   Left circumflex (LCx):  the vessel is heavily calcified proximally with 50% proximal disease. OM 2 is severely diseased. OM 3 has a significant proximal stenosis and has competitive flow from a patent graft.  Right coronary artery (RCA):  the vessel is heavily calcified proximally and is occluded in the midsegment.   SVG to RCA:  the graft is patent with 90% hazy mid stenosis.  SVG to OM 3: The graft is patent with mild 30% proximal stenosis.  LIMA  was injected but it is actually not anastomose to the LAD.  SVG to LAD and diagonal: There is an 85% tubular stenosis proximally.  Left ventriculography:  was not performed    Interventional Procedural Details:   Weight-based bivalirudin was  given for anticoagulation. Once a therapeutic ACT was achieved, a 6 [place] JR 4 guide catheter was inserted into the SVG to RCA but did not fit value very well. Thus, I used a multipurpose A1 guiding catheter.  A prolonged or coronary guidewire was used  to cross the lesion. A spider distal protection device was then inserted.  The lesion was then stented with a 3.5 x 23 mm MultiLink vision stent.  The stent was not postdilated.  Following PCI, there was 10% residual stenosis and TIMI-3 flow. Final angiography confirmed an excellent result. I then used a JR 4 guiding catheter to engage the SVG to LAD. The lesion was crossed with a prowater wire. A spider distal protection device was deployed. The lesion was stented with a 3.5 x 26 mm integrity bare-metal stent and deployed to 14 atmosphere. Following PCI, there was 0% residual stenosis with TIMI 1 flow due to no reflow. I used a thrombectomy catheter due to suspected overfilling off the basket. I then retrieved the distal protection device. High doses of adenosine was given as well as nitroglycerin. TIMI-3 flow was established. The patient did have some chest discomfort and EKG changes that improved significantly with treatment. The patient tolerated the procedure well. It was decided to start him on nitroglycerin drip due to residual chest pain and admit to TCU.  The patient was transferred to the post catheterization recovery area for further monitoring.  Lesion Data: Vessel: SVG to RCA in midsegment Percent stenosis (pre): 90% TIMI-flow (pre):  3 Stent:  3.5 x 23 mm vision bare metal stent Percent stenosis (post): 10% TIMI-flow (post): 3  Lesion Data: Vessel: SVG to LAD in proximal segment Percent stenosis (pre): 85% TIMI-flow (pre):  3 Stent:  3.5 x 26 mm integrity bare metal stent Percent stenosis (post): 10% TIMI-flow (post): 3   Conclusions: 1. Mildly elevated filling pressures with moderate pulmonary hypertension. 2. Moderate aortic stenosis with a mean gradient of 27 mm mercury and calculated valve area of 1.05 cm. There was no difficulty at all in crossing the aortic valve. 3. Severe native three-vessel coronary artery disease. 4. Severe disease in SVG to RCA as well as SVG to  LAD/diagonal. Patent SVG to OM 3. 5. Successful angioplasty and bare-metal stent placement to both SVG to RCA as well as SVG to LAD using distal protection device. 6. No reflow during SVG to LAD was treated successfully with intracoronary adenosine.  Recommendations: I recommend aggressive medical therapy. The patient has known history of atrial fibrillation. Warfarin will be resumed tonight without heparin. He would be treated with aspirin 81 mg daily as well as Plavix 75 mg once daily. Once his INR is therapeutic will stop aspirin. Continue dofetilide 250 mcg twice daily as discussed with Dr. Ladona Ridgel.  [name] 06/20/2011, 10:29 AM

## 2011-06-20 NOTE — Progress Notes (Signed)
ANTICOAGULATION CONSULT NOTE - Initial Consult  Pharmacy Consult for Heparin Indication: chest pain/ACS  No Known Allergies  Patient Measurements: Height: 5\' 7"  (170.2 cm) Weight: 200 lb 13.4 oz (91.1 kg) IBW/kg (Calculated) : 66.1  Heparin dosing weight: 85 kg  Vital Signs: Temp: 97.7 F (36.5 C) (12/06 0644) Temp src: Oral (12/06 0644) BP: 122/60 mmHg (12/06 1535) Pulse Rate: 73  (12/06 1535)  Labs:  [facility] 06/20/11 0656 06/19/11 2312 06/19/11 1818 06/19/11 1449  HGB 12.5* -- -- 11.5*  HCT 40.1 -- -- 35.1*  PLT 151 -- -- 160  APTT -- -- -- 30  LABPROT -- -- -- 14.4  INR -- -- -- 1.10  HEPARINUNFRC 0.19* 0.32 -- --  CREATININE -- -- -- 1.34  CKTOTAL -- 74 84 86  CKMB -- 3.8 3.9 4.0  TROPONINI -- <0.30 <0.30 <0.30   Estimated Creatinine Clearance: 41.8 ml/min (by C-G formula based on Cr of 1.34).   Assessment: 77 y.o. Male with h/o Afib, on heparin prior to cath, HL 0.19 prior to heparin being turned off for lab.   Goal of Therapy:  Heparin level 0.3-0.7 units/ml   Plan:  Heparin d/c'd post cath, MD dosing warfarin.  Will order INR x3 days, and pharmacy will s/o.  Thanks!  Terrye Dombrosky C 06/20/2011,3:50 PM

## 2011-06-20 NOTE — Interval H&P Note (Signed)
History and Physical Interval Note:  06/20/2011 7:44 AM  Juan Wolf  has presented today for surgery, with the diagnosis of chest pain and aortic stenosis.   The various methods of treatment have been discussed with the patient and family. After consideration of risks, benefits and other options for treatment, the patient has consented to  Procedure(s): LEFT AND RIGHT HEART CATHETERIZATION WITH CORONARY/GRAFT ANGIOGRAM as a surgical intervention .  The patients' history has been reviewed, patient examined, no change in status, stable for surgery.  I have reviewed the patients' chart and labs.  Questions were answered to the patient's satisfaction.     [name]

## 2011-06-21 ENCOUNTER — Encounter: Payer: Self-pay | Admitting: Cardiovascular Disease

## 2011-06-21 DIAGNOSIS — I2510 Atherosclerotic heart disease of native coronary artery without angina pectoris: Secondary | ICD-10-CM

## 2011-06-21 LAB — CBC
HCT: 35 % — ABNORMAL LOW (ref 39.0–52.0)
Hemoglobin: 11.2 g/dL — ABNORMAL LOW (ref 13.0–17.0)
MCHC: 32 g/dL (ref 30.0–36.0)
MCV: 92.8 fL (ref 78.0–100.0)
WBC: 6.5 10*3/uL (ref 4.0–10.5)

## 2011-06-21 LAB — BASIC METABOLIC PANEL
BUN: 21 mg/dL (ref 6–23)
Chloride: 107 mEq/L (ref 96–112)
Creatinine, Ser: 1.39 mg/dL — ABNORMAL HIGH (ref 0.50–1.35)
Glucose, Bld: 98 mg/dL (ref 70–99)
Potassium: 4.5 mEq/L (ref 3.5–5.1)

## 2011-06-21 MED ORDER — DOFETILIDE 250 MCG PO CAPS: 250.0000 ug | ORAL_CAPSULE | Freq: Two times a day (BID) | ORAL | Status: DC

## 2011-06-21 MED ORDER — CLOPIDOGREL BISULFATE 75 MG PO TABS: 75.0000 mg | ORAL_TABLET | Freq: Every day | ORAL | Status: DC

## 2011-06-21 MED FILL — Dextrose Inj 5%: INTRAVENOUS | Qty: 50 | Status: AC

## 2011-06-21 NOTE — Progress Notes (Signed)
CARDIAC REHAB PHASE I   PRE:  Rate/Rhythm: 82 Afib    BP: sitting 154/30    SaO2: 92 RA  MODE:  Ambulation: 350 ft   POST:  Rate/Rhythm: 107 A fib    BP: sitting 151/48     SaO2: 95 RA  To BR before walk with loose BM. Ambulated 350 ft pushing W/C for support. Tried holding to nothing at first of walk but pt felt more secure with W/C. Rest at 1/2 way for SOB/DOE. SaO2 good on ear, 93 RA. Pt DOE on return to recliner, SaO2 stable. Sts he is exerted but not near like what he was when he was admitted. Sts he could not walk 25 ft without getting tightness in chest and SOB. Discussed education. Feel pt would benefit from RW for home and also HH safety eval as he lives independently, still drives, shops, etc. Pt gets around house/room by holding to furniture.  Is reluctant but agreeable to a RW.  [id number]  [name] CES, ACSM

## 2011-06-21 NOTE — Progress Notes (Signed)
Subjective: No CP. Got short-winded coming back from bathroom, has chronic drainage from sinuses but activity has been decreased for a while 2nd getting CP with exertion. Feels better in general  Objective: Filed Vitals:   06/21/11 0500 06/21/11 0600 06/21/11 0800 06/21/11 0815  BP:  120/43    Pulse: 80 75 77   Temp:    98.1 F (36.7 C)  TempSrc:    Oral  Resp: 0 19 9   Height:      Weight:      SpO2: 96% 98% 92% 97%   Weight change: 5 lb 8.2 oz (2.5 kg)  Intake/Output Summary (Last 24 hours) at 06/21/11 0946 Last data filed at 06/21/11 0900  Gross per 24 hour  Intake 1427.75 ml  Output   2650 ml  Net -1222.25 ml    General: Alert, awake, oriented x3, in no acute distress.  Heart: Irregular rate and rhythm, with 2/6 murmur, no rubs, gallops. Min JVD Lungs: bibasilar crackles, R>L with rales Abdomen: Soft, nontender, nondistended, positive bowel sounds.  Neuro: Grossly intact, nonfocal. Extrem: cath site without hematoma/bruit, no ecchymosis; no LE edema  Lab Results: [facility] 06/21/11 0511 06/19/11 1449  NA 140 140  K 4.5 4.5  CL 107 106  CO2 27 24  GLUCOSE 98 85  BUN 21 25*  CREATININE 1.39* 1.34  CALCIUM 9.0 9.4  MG -- --  PHOS -- --    Basename 06/19/11 1449  AST 18  ALT 11  ALKPHOS 77  BILITOT 0.8  PROT 7.5  ALBUMIN 3.7    Basename 06/21/11 0511 06/20/11 0656 06/19/11 1449  WBC 6.5 7.9 --  NEUTROABS -- -- 4.6  HGB 11.2* 12.5* --  HCT 35.0* 40.1 --  MCV 92.8 93.3 --  PLT 142* 151 --    Basename 06/19/11 2312 06/19/11 1818 06/19/11 1449  CKTOTAL 74 84 86  CKMB 3.8 3.9 4.0  CKMBINDEX -- -- --  TROPONINI <0.30 <0.30 <0.30    Basename 06/19/11 1449  HGBA1C 5.8*    Basename 06/19/11 1449  TSH 1.608  T4TOTAL --  T3FREE --  THYROIDAB --    Studies/Results: X-ray Chest Pa And Lateral 06/19/2011  *RADIOLOGY REPORT*  Clinical Data: Chest pain, shortness of breath.  CHEST - 2 VIEW  Comparison: 07/29/2007.  Findings: Trachea is midline.   Heart size stable.  Lungs are somewhat low in volume with interstitial prominence and coarsening diffusely, and somewhat of a basilar predominance.  Appearance is unchanged from 07/29/2007.  No pleural fluid.  IMPRESSION: Diffuse interstitial prominence, with somewhat of a basilar predominance, similar to 07/29/2007.  Chronic interstitial lung disease is considered.  Edema cannot be excluded.  Original Report   Cath: Left mainstem: the vessel is normal in size and calcified. There is diffuse mild disease.  Left anterior descending (LAD): The native vessel is heavily calcified and is occluded in the midsegment.  Left circumflex (LCx): the vessel is heavily calcified proximally with 50% proximal disease.  OM 2 is severely diseased.  OM 3 has a significant proximal stenosis and has competitive flow from a patent graft.  Right coronary artery (RCA): the vessel is heavily calcified proximally and is occluded in the midsegment.  SVG to RCA: the graft is patent with 90% hazy mid stenosis.  SVG to OM 3: The graft is patent with mild 30% proximal stenosis.  LIMA was injected but it is actually not anastomose to the LAD.  SVG to LAD and diagonal: There is an 85% tubular stenosis  proximally.  Left ventriculography: was not performed  Interventional Procedural Details: Weight-based bivalirudin was given for anticoagulation. Once a therapeutic ACT was achieved, a 6 [place] JR 4 guide catheter was inserted into the SVG to RCA but did not fit value very well. Thus, I used a multipurpose A1 guiding catheter. A prolonged or coronary guidewire was used to cross the lesion. A spider distal protection device was then inserted. The lesion was then stented with a 3.5 x 23 mm MultiLink vision stent. The stent was not postdilated. Following PCI, there was 10% residual stenosis and TIMI-3 flow. Final angiography confirmed an excellent result. I then used a JR 4 guiding catheter to engage the SVG to LAD. The lesion was crossed  with a prowater wire. A spider distal protection device was deployed. The lesion was stented with a 3.5 x 26 mm integrity bare-metal stent and deployed to 14 atmosphere. Following PCI, there was 0% residual stenosis with TIMI 1 flow due to no reflow. I used a thrombectomy catheter due to suspected overfilling off the basket. I then retrieved the distal protection device. High doses of adenosine was given as well as nitroglycerin. TIMI-3 flow was established. The patient did have some chest discomfort and EKG changes that improved significantly with treatment. The patient tolerated the procedure well. It was decided to start him on nitroglycerin drip due to residual chest pain and admit to TCU.   Medications:  I have reviewed the patient's current medications. Scheduled:   . aspirin EC  81 mg Oral Daily  . clopidogrel  75 mg Oral Q breakfast  . dofetilide  250 mcg Oral Q12H  . furosemide  40 mg Oral Daily  . gabapentin  200 mg Oral QHS  . isosorbide mononitrate  30 mg Oral Daily  . lisinopril  5 mg Oral BID  . metoprolol succinate  25 mg Oral QHS  . potassium chloride SA  20 mEq Oral Daily  . sodium chloride  3 mL Intravenous Q12H  . warfarin  5 mg Oral q1800    Patient Problem List  Diagnoses  . DYSLIPIDEMIA  . HYPERTENSION  . ATRIAL FIBRILLATION  . ATRIAL FLUTTER  . COPD  . UMBILICAL HERNIA  . LEG CRAMPS  . WEAKNESS  . DYSPNEA  . Chest pain on exertion  . CAD (coronary artery disease)  . Murmur  . Aortic stenosis   1. CAD - s/p 2 vessel PCI - continue ASA/plavix - ?2PY12 in am.  2. DOE/COPD: cardiac rehab to see, ck sats with amb and ck BNP, if elevated, may need IV lasix    3.  ?deconditioning - After his wife died in 03/25/23, his activity level was low and then he was having exertional CP for a while, may need OP rehab or physical therapy eval. Cardiac rehab seeing  4. Atrial fib: rate OK on current Rx.  5. Anticoag: coumadin being reloaded, ck INR in am. No  hep/lovenox  Plan: Pt otherwise stable, continue current therapy, consider Tx telem. D/C when med stable.   LOS: 2 days   Theodore Demark 06/21/2011, 9:46 AM

## 2011-06-21 NOTE — Discharge Summary (Signed)
Discharge Summary   Patient ID: Juan Wolf MRN: [id number], DOB/AGE: [id number] 75 y.o. Admit date: 06/19/2011 D/C date:     06/21/2011   Primary Discharge Diagnoses:  1. CAD - s/p angioplasty and bare-metal stent placement to both SVG to RCA as well as SVG to LAD this admission on 06/20/11 - s/p CABG 1997 with EF 55-60% 2008 - placed on Plavix with plan to discontinue aspirin once INR returns to therapeutic level 2. Atrial fibrillation - Tikosyn dose decreased this admission because of renal insufficiency - anticoagulated with Coumadin 3. CKD with d/c Cr of 1.39 4. Aortic stenosis - moderate by cath 06/20/11, mean gradient and AVA 1.05cm  Secondary Discharge Diagnoses:  1. Atrial flutter  - s/p RFCA 2. GERD 3. Arthritis 4. HTN 5. HL 6. COPD 7. Umbilical hernia  Hospital Course: Juan Wolf is an 77 y/o M who presented to [facility] for cath for chest pain. He has a history of CAD in 1997 at [facility] with no ischemic event since then. His workup included a nuclear stress test which showed a fixed inferior wall defect. Echocardiogram showed normal LV systolic function with moderate to severe aortic stenosis. Mean gradient was 27 mmHg. Due to his age, Dr. Kirke Corin recommended initial medical therapy to try to control his angina. However, despite addition of Toprol and Imdur, he had no improvement and in fact worsening of symptoms including SSCP with even low-level activity. Dr. Kirke Corin did routine labs on him which showed a creatinine of 1.7. His previous creatinine was 1.34. His lasix was decreased by his PCP and he was admitted for IV hydration in prep for cath. Enzymes were negative. Coumadin had been held for the cath. IV heparin was started to bridge. He was admitted for cath and ultimately had successful angioplasty and bare-metal stent placement to both SVG to RCA as well as SVG to LAD using distal protection device. No reflow during SVG to LAD was treated successfully with  intracoronary adenosine per cath report. Dr. Kirke Corin recommended aggressive medical therapy. Warfarin was resumed the night before. He would be treated with aspirin 81 mg daily as well as Plavix 75 mg once daily. Once his INR is therapeutic, aspirin is planned to be stopped. Today the patient is doing well. He is back on Lasix. Of note, his Tikosyn dose was decreased given renal insufficiency. Dr. Kirke Corin has seen & examined him today and feels he is stable for discharge with plans to f/u 2-3 weeks, check BMP/INR early next week with plans to stop ASA and leave him on Plavix/Coumadin once INR therapeutic.  Discharge Vitals: Blood pressure 123/60, pulse 46, temperature 97.7 F (36.5 C), temperature source Oral, resp. rate 21, height 5\' 7"  (1.702 m), weight 204 lb 2.3 oz (92.6 kg), SpO2 96.00%.  Labs: Lab Results  Component Value Date   WBC 6.5 06/21/2011   HGB 11.2* 06/21/2011   HCT 35.0* 06/21/2011   MCV 92.8 06/21/2011   PLT 142* 06/21/2011    Lab 06/21/11 0511 06/19/11 1449  NA 140 --  K 4.5 --  CL 107 --  CO2 27 --  BUN 21 --  CREATININE 1.39* --  CALCIUM 9.0 --  PROT -- 7.5  BILITOT -- 0.8  ALKPHOS -- 77  ALT -- 11  AST -- 18  GLUCOSE 98 --    Basename 06/19/11 2312 06/19/11 1818 06/19/11 1449  CKTOTAL 74 84 86  CKMB 3.8 3.9 4.0  TROPONINI <0.30 <0.30 <0.30   Diagnostic Studies/Procedures:  1. CXR 06/19/2011  *RADIOLOGY REPORT*  Clinical Data: Chest pain, shortness of breath.  CHEST - 2 VIEW  Comparison: 07/29/2007.  Findings: Trachea is midline.  Heart size stable.  Lungs are somewhat low in volume with interstitial prominence and coarsening diffusely, and somewhat of a basilar predominance.  Appearance is unchanged from 07/29/2007.  No pleural fluid.  IMPRESSION: Diffuse interstitial prominence, with somewhat of a basilar predominance, similar to 07/29/2007.  Chronic interstitial lung disease is considered.  Edema cannot be excluded. 2. Cardiac catheterization this admission, please  see full report and above for summary.   Discharge Medications   Current Discharge Medication List    START taking these medications   Details  clopidogrel (PLAVIX) 75 MG tablet Take 1 tablet (75 mg total) by mouth daily with breakfast. Qty: 30 tablet, Refills: 1      CONTINUE these medications which have CHANGED   Details  dofetilide (TIKOSYN) 250 MCG capsule Take 1 capsule (250 mcg total) by mouth every 12 (twelve) hours. Qty: 60 capsule, Refills: 1      CONTINUE these medications which have NOT CHANGED   Details  aspirin 81 MG chewable tablet Chew 81 mg by mouth daily.      furosemide (LASIX) 40 MG tablet Take 40 mg by mouth daily.      gabapentin (NEURONTIN) 100 MG capsule Take 200 mg by mouth daily.     isosorbide mononitrate (IMDUR) 30 MG 24 hr tablet Take 30 mg by mouth daily.      lisinopril (PRINIVIL,ZESTRIL) 5 MG tablet Take 5 mg by mouth 2 (two) times daily.      metoprolol succinate (TOPROL-XL) 25 MG 24 hr tablet Take 25 mg by mouth daily.      nitroGLYCERIN (NITROSTAT) 0.4 MG SL tablet Place 0.4 mg under the tongue every 5 (five) minutes as needed. For chest pain     potassium chloride SA (K-DUR,KLOR-CON) 20 MEQ tablet Take 20 mEq by mouth daily.      warfarin (COUMADIN) 5 MG tablet Take 5-7.5 mg by mouth daily. Patient reports: 5mg  daily except 7.5mg  Monday, Wednesday, Friday.     zolpidem (AMBIEN) 10 MG tablet Take 10 mg by mouth at bedtime as needed. For sleep         Disposition   The patient will be discharged in stable condition to home. Discharge Orders    Future Appointments: Provider: Department: Dept Phone: Center:   07/04/2011 1:30 PM Iran Ouch, MD [facility] [phone] [facility]     Future Orders Please Complete By Expires   Diet - low sodium heart healthy      Increase activity slowly      Comments:   No driving for 3 days. No lifting over 5 lbs for 1 week. No sexual activity for 1 week.    Discharge wound care:       Comments:   Keep procedure site clean & dry. If you notice increased pain, swelling, bleeding or pus, call/return!  You may shower, but no soaking baths/hot tubs/pools for 1 week.       Follow-up Information    Follow up with [name], MD. (Dr. Jari Sportsman office will call you on Monday 06/24/11 to schedule a follow-up. They will also set you up for labs on Monday - call the office if you haven't heard from them by noon . They will tell you if you need to stop aspirin -- make sure to ask)    Contact information:   [address] [place] [place] [address] [phone]          I left a message on our office's line to arrange BMET/INR on Monday 06/24/11 with reminder to show results to MD to make decision about aspirin.  Duration of Discharge Encounter: Greater than 30 minutes including physician and PA time.  Signed, Ronie Spies PA-C 06/21/2011, 6:11 PM

## 2011-06-21 NOTE — Progress Notes (Signed)
I have seen and examined the patient. I agree with the above note with the addition of : The patient seems to be mildly fluid overloaded but overall was able to ambulate without exertional chest pain. IVFs were stopped. He is on Lasix 40 mg po daily.  Femoral site is intact.  Will discharge home today. Follow up with me in 2-3 weeks. He needs cardiac rehab at [facility].  Check BMP and INR early next week. I plan on stopping Aspirin and leaving him on Warfarin and Plavix once INR is theraputic.   [name] 06/21/2011 2:50 PM

## 2011-06-21 NOTE — Discharge Summary (Signed)
I have seen and examined the patient. I agree with the above note with the addition of : The patient is feeling much better. His right groin is intact without hematoma. Vital signs are stable. I agree with the plan outlined above.  [name] 06/21/2011 6:53 PM

## 2011-06-25 ENCOUNTER — Ambulatory Visit (INDEPENDENT_AMBULATORY_CARE_PROVIDER_SITE_OTHER): Payer: Medicare Other | Admitting: *Deleted

## 2011-06-25 ENCOUNTER — Encounter: Payer: Medicare Other | Admitting: *Deleted

## 2011-06-25 ENCOUNTER — Other Ambulatory Visit: Payer: Self-pay | Admitting: *Deleted

## 2011-06-25 DIAGNOSIS — Z7901 Long term (current) use of anticoagulants: Secondary | ICD-10-CM

## 2011-06-25 DIAGNOSIS — N289 Disorder of kidney and ureter, unspecified: Secondary | ICD-10-CM

## 2011-06-25 DIAGNOSIS — I4891 Unspecified atrial fibrillation: Secondary | ICD-10-CM

## 2011-06-25 NOTE — Progress Notes (Signed)
Requested ekg done per Dr. Kirke Corin. EKG forwarded to MD for review.

## 2011-07-02 ENCOUNTER — Ambulatory Visit (INDEPENDENT_AMBULATORY_CARE_PROVIDER_SITE_OTHER): Payer: Medicare Other | Admitting: *Deleted

## 2011-07-02 DIAGNOSIS — Z7901 Long term (current) use of anticoagulants: Secondary | ICD-10-CM

## 2011-07-02 DIAGNOSIS — I4891 Unspecified atrial fibrillation: Secondary | ICD-10-CM

## 2011-07-04 ENCOUNTER — Ambulatory Visit (INDEPENDENT_AMBULATORY_CARE_PROVIDER_SITE_OTHER): Payer: Medicare Other | Admitting: Cardiovascular Disease

## 2011-07-04 ENCOUNTER — Encounter: Payer: Self-pay | Admitting: Cardiovascular Disease

## 2011-07-04 VITALS — BP 136/59 | HR 71 | Ht 67.0 in | Wt 202.0 lb

## 2011-07-04 DIAGNOSIS — I4891 Unspecified atrial fibrillation: Secondary | ICD-10-CM

## 2011-07-04 DIAGNOSIS — I2510 Atherosclerotic heart disease of native coronary artery without angina pectoris: Secondary | ICD-10-CM

## 2011-07-04 DIAGNOSIS — I350 Nonrheumatic aortic (valve) stenosis: Secondary | ICD-10-CM

## 2011-07-04 DIAGNOSIS — I359 Nonrheumatic aortic valve disorder, unspecified: Secondary | ICD-10-CM

## 2011-07-04 DIAGNOSIS — I4892 Unspecified atrial flutter: Secondary | ICD-10-CM

## 2011-07-04 NOTE — Patient Instructions (Signed)
Follow up as scheduled. Stop Aspirin.

## 2011-07-04 NOTE — Assessment & Plan Note (Addendum)
The patient had a recent angioplasty and 2 bare-metal stent placements to SVG to RCA as well as SVG to LAD/diagonal. Overall, he feels much better. He still has mild exertional chest discomfort but this overall improved significantly. Continue Imdur and Toprol. He is on anticoagulation for atrial fibrillation. His INR was therapeutic. Thus, I will go ahead and stop aspirin. Continue Plavix and warfarin. It would be reasonable to switch Plavix to aspirin 81 mg once daily after 3 months. I advised the patient to gradually increase his physical activities.

## 2011-07-04 NOTE — Assessment & Plan Note (Signed)
The patient is in sinus rhythm with PACs. The dose of dofetilide was decreased to 250 mcg twice daily due to mild worsening of renal function as well as prolonged QT interval. His QT interval does not seem to be prolonged now. Continue current treatment.

## 2011-07-04 NOTE — Progress Notes (Signed)
HPI  This is an 77 year old male who is here today for a followup visit. I recently performed a right and left cardiac catheterization due to refractory anginal symptoms. He was admitted the night before for IV hydration due to chronic kidney disease. His cardiac catheterization showed moderate pulmonary hypertension with moderate aortic stenosis. Mean gradient was 27 mmHg with a calculated valve area of 1.05. Coronary angiography showed severe underlying three-vessel coronary artery disease with significant 90% disease in both SVG to RCA and SVG to LAD/diagonal. SVG to OM had only mild disease. He underwent an angioplasty in bare-metal stent placement to both vein grafts with distal protection device. He had no reflow and SVG to LAD which was treated successfully with high-dose intracoronary adenosine which established TIMI-3 flow. Overall results were very good. The patient was discharged home next day. He had followup labs which showed a creatinine of 1.43. He is also taking now aspirin, Plavix and warfarin. His INR is now therapeutic. Overall, he feels better than before. His dyspnea has improved significantly. His chest pain has also improved but did not resolve completely. He has not had to use any nitroglycerin.   No Known Allergies   Current Outpatient Prescriptions on File Prior to Visit  Medication Sig Dispense Refill  . clopidogrel (PLAVIX) 75 MG tablet Take 1 tablet (75 mg total) by mouth daily with breakfast.  30 tablet  1  . dofetilide (TIKOSYN) 250 MCG capsule Take 1 capsule (250 mcg total) by mouth every 12 (twelve) hours.  60 capsule  1  . furosemide (LASIX) 40 MG tablet Take 40 mg by mouth daily.        Marland Kitchen gabapentin (NEURONTIN) 100 MG capsule Take 200 mg by mouth daily.       . isosorbide mononitrate (IMDUR) 30 MG 24 hr tablet Take 30 mg by mouth daily.        Marland Kitchen lisinopril (PRINIVIL,ZESTRIL) 5 MG tablet Take 5 mg by mouth 2 (two) times daily.        . metoprolol succinate (TOPROL-XL)  25 MG 24 hr tablet Take 25 mg by mouth daily.        . nitroGLYCERIN (NITROSTAT) 0.4 MG SL tablet Place 0.4 mg under the tongue every 5 (five) minutes as needed. For chest pain       . potassium chloride SA (K-DUR,KLOR-CON) 20 MEQ tablet Take 20 mEq by mouth daily.        Marland Kitchen warfarin (COUMADIN) 5 MG tablet Take 5-7.5 mg by mouth daily. Patient reports: 5mg  daily except 7.5mg  Monday, Wednesday, Friday.       . zolpidem (AMBIEN) 10 MG tablet Take 10 mg by mouth at bedtime as needed. For sleep          Past Medical History  Diagnosis Date  . Other malaise and fatigue   . Shortness of breath   . Umbilical hernia without mention of obstruction or gangrene   . Other and unspecified hyperlipidemia   . COPD (chronic obstructive pulmonary disease)   . Hypertension   . Cramp of limb   . Atrial flutter     s/p RFCA   . Atrial fibrillation     tikosyn rx  . CAD (coronary artery disease)     s/p CABG 1997;  echo 2008: EF 55-60%  . Aortic stenosis 06/05/2011  . Chronic kidney disease     CKD  . Complication of anesthesia     " The doctor told me not to let anyone put me  to sleep"  . Myocardial infarction   . GERD (gastroesophageal reflux disease)   . Arthritis      Past Surgical History  Procedure Date  . Appendectomy   . Back surgery   . Cholecystectomy   . Hernia repair   . Cardiac catheterization 06/19/2011    Moderate pulmonary hypertension, moderate AS (valve area: 1.05), severe 3 vessel CAD. 90% stensis in both SVG to LAD/diagonal and SVG to RCA. Patent SVG to OM.      History reviewed. No pertinent family history.   History   Social History  . Marital Status: Married    Spouse Name: N/A    Number of Children: N/A  . Years of Education: N/A   Occupational History  . Not on file.   Social History Main Topics  . Smoking status: Former Smoker -- 0.3 packs/day for 55 years    Types: Cigars    Quit date: 07/16/1995  . Smokeless tobacco: Former Neurosurgeon    Types: Chew     Quit date: 07/15/1944   Comment: chewed tobacoo for 3 years  . Alcohol Use: No  . Drug Use: No  . Sexually Active: No   Other Topics Concern  . Not on file   Social History Narrative  . No narrative on file      PHYSICAL EXAM   BP 136/59  Pulse 71  Ht 5\' 7"  (1.702 m)  Wt 202 lb (91.627 kg)  BMI 31.64 kg/m2  Constitutional: He is oriented to person, place, and time. He appears well-developed and well-nourished. No distress.  HENT: No nasal discharge.  Head: Normocephalic and atraumatic.  Eyes: Pupils are equal, round, and reactive to light. Right eye exhibits no discharge. Left eye exhibits no discharge.  Neck: Normal range of motion. Neck supple. No JVD present. No thyromegaly present.  Cardiovascular: Normal rate, regular rhythm, normal heart sounds and intact distal pulses. Exam reveals no gallop and no friction rub.  There is a 3/6 systolic ejection murmur at the aortic area which is mid peaking with slightly diminished S2. Pulmonary/Chest: Effort normal and breath sounds normal. No stridor. No respiratory distress. He has no wheezes. He has no rales. He exhibits no tenderness.  Abdominal: Soft. Bowel sounds are normal. He exhibits no distension. There is no tenderness. There is no rebound and no guarding.  Musculoskeletal: Normal range of motion. He exhibits no edema and no tenderness.  Neurological: He is alert and oriented to person, place, and time. Coordination normal.  Skin: Skin is warm and dry. No rash noted. He is not diaphoretic. No erythema. No pallor.  Psychiatric: He has a normal mood and affect. His behavior is normal. Judgment and thought content normal.  Right groin cath site is intact with no hematoma or bruit.     EKG: Sinus rhythm with PACs. QT does not appear to be prolonged. QT interval is 432 ms and QTC is 451 ms. No more PVCs.   ASSESSMENT AND PLAN

## 2011-07-04 NOTE — Assessment & Plan Note (Signed)
His aortic stenosis was moderate by cardiac catheterization. Continue medical management.

## 2011-09-03 ENCOUNTER — Encounter: Payer: Self-pay | Admitting: Cardiovascular Disease

## 2011-09-03 ENCOUNTER — Ambulatory Visit (INDEPENDENT_AMBULATORY_CARE_PROVIDER_SITE_OTHER): Payer: Medicare Other | Admitting: Cardiovascular Disease

## 2011-09-03 VITALS — BP 163/63 | HR 100 | Ht 67.0 in | Wt 204.0 lb

## 2011-09-03 DIAGNOSIS — I350 Nonrheumatic aortic (valve) stenosis: Secondary | ICD-10-CM

## 2011-09-03 DIAGNOSIS — I2510 Atherosclerotic heart disease of native coronary artery without angina pectoris: Secondary | ICD-10-CM

## 2011-09-03 DIAGNOSIS — I4891 Unspecified atrial fibrillation: Secondary | ICD-10-CM

## 2011-09-03 DIAGNOSIS — I4892 Unspecified atrial flutter: Secondary | ICD-10-CM

## 2011-09-03 DIAGNOSIS — R0602 Shortness of breath: Secondary | ICD-10-CM

## 2011-09-03 DIAGNOSIS — I359 Nonrheumatic aortic valve disorder, unspecified: Secondary | ICD-10-CM

## 2011-09-03 MED ORDER — METOPROLOL SUCCINATE ER 50 MG PO TB24: 50.0000 mg | ORAL_TABLET | Freq: Every day | ORAL | Status: DC

## 2011-09-03 MED ORDER — ASPIRIN EC 81 MG PO TBEC: 81.0000 mg | DELAYED_RELEASE_TABLET | Freq: Every day | ORAL | Status: DC

## 2011-09-03 NOTE — Assessment & Plan Note (Addendum)
At the aortic stenosis is moderate and will be monitored by serial echocardiograms. I do not think he is a candidate for aortic valve replacement but  he might be a candidate for TAVR if his aortic stenosis worsens in the future.

## 2011-09-03 NOTE — Progress Notes (Signed)
HPI  An 78 year old man who is here today for a followup visit. He has multiple cardiac conditions that include coronary artery disease status post CABG, moderate aortic stenosis and atrial flutter status post ablation on long-term treatment with dofetilide. The patient had 2 bare-metal stent placement to 2 of his 3 vein grafts in December of 2012.  Since then, the patient has not had any recurrent chest tightness. However, recently he noticed increased dyspnea and occasional palpitations. He denies any orthopnea or PND. He feels occasionally dizzy but there has been no syncope or presyncope. He started having nosebleeds recently while he was taking Plavix. Thus, he did not get her medications refilled.  No Known Allergies   Current Outpatient Prescriptions on File Prior to Visit  Medication Sig Dispense Refill  . furosemide (LASIX) 40 MG tablet Take 40 mg by mouth daily.        Marland Kitchen gabapentin (NEURONTIN) 100 MG capsule Take 200 mg by mouth daily.       . isosorbide mononitrate (IMDUR) 30 MG 24 hr tablet Take 30 mg by mouth daily.        Marland Kitchen lisinopril (PRINIVIL,ZESTRIL) 5 MG tablet Take 5 mg by mouth 2 (two) times daily.        . nitroGLYCERIN (NITROSTAT) 0.4 MG SL tablet Place 0.4 mg under the tongue every 5 (five) minutes as needed. For chest pain       . potassium chloride SA (K-DUR,KLOR-CON) 20 MEQ tablet Take 20 mEq by mouth daily.        Marland Kitchen warfarin (COUMADIN) 5 MG tablet Take 5-7.5 mg by mouth daily. Patient reports: 5mg  daily except 7.5mg  Monday, Wednesday, Friday.       . zolpidem (AMBIEN) 10 MG tablet Take 10 mg by mouth at bedtime as needed. For sleep          Past Medical History  Diagnosis Date  . Other malaise and fatigue   . Shortness of breath   . Umbilical hernia without mention of obstruction or gangrene   . Other and unspecified hyperlipidemia   . COPD (chronic obstructive pulmonary disease)   . Hypertension   . Cramp of limb   . Atrial flutter     s/p RFCA   . Atrial  fibrillation     tikosyn rx  . CAD (coronary artery disease)     s/p CABG 1997;  echo 2008: EF 55-60%  . Aortic stenosis 06/05/2011  . Chronic kidney disease     CKD  . Complication of anesthesia     " The doctor told me not to let anyone put me to sleep"  . Myocardial infarction   . GERD (gastroesophageal reflux disease)   . Arthritis      Past Surgical History  Procedure Date  . Appendectomy   . Back surgery   . Cholecystectomy   . Hernia repair   . Cardiac catheterization 06/19/2011    Moderate pulmonary hypertension, moderate AS (valve area: 1.05), severe 3 vessel CAD. 90% stensis in both SVG to LAD/diagonal and SVG to RCA. Patent SVG to OM.   Marland Kitchen Coronary angioplasty 06/19/2011    BMS to both SVG to RCA and SVG to LAD/diagonal     No family history on file.   History   Social History  . Marital Status: Married    Spouse Name: N/A    Number of Children: N/A  . Years of Education: N/A   Occupational History  . Not on file.   Social  History Main Topics  . Smoking status: Former Smoker -- 0.3 packs/day for 55 years    Types: Cigars    Quit date: 07/16/1995  . Smokeless tobacco: Former Neurosurgeon    Types: Chew    Quit date: 07/15/1944   Comment: chewed tobacoo for 3 years  . Alcohol Use: No  . Drug Use: No  . Sexually Active: No   Other Topics Concern  . Not on file   Social History Narrative  . No narrative on file     PHYSICAL EXAM   BP 163/63  Pulse 100  Ht 5\' 7"  (1.702 m)  Wt 204 lb (92.534 kg)  BMI 31.95 kg/m2  SpO2 95%  Constitutional: He is oriented to person, place, and time. He appears well-developed and well-nourished. No distress.  HENT: No nasal discharge.  Head: Normocephalic and atraumatic.  Eyes: Pupils are equal and round. Right eye exhibits no discharge. Left eye exhibits no discharge.  Neck: Normal range of motion. Neck supple. No JVD present. No thyromegaly present.  Cardiovascular: Normal rate, regular rhythm with frequent  premature beats, normal heart sounds and. Exam reveals no gallop and no friction rub. There is a 3/6 systolic ejection murmur at the aortic area which is mid peaking with only slightly diminished S2. Pulmonary/Chest: Effort normal and breath sounds normal. No stridor. No respiratory distress. He has no wheezes. He has no rales. He exhibits no tenderness.  Abdominal: Soft. Bowel sounds are normal. He exhibits no distension. There is no tenderness. There is no rebound and no guarding.  Musculoskeletal: Normal range of motion. He exhibits +1 edema and no tenderness.  Neurological: He is alert and oriented to person, place, and time. Coordination normal.  Skin: Skin is warm and dry. No rash noted. He is not diaphoretic. No erythema. No pallor.  Psychiatric: He has a normal mood and affect. His behavior is normal. Judgment and thought content normal.      EKG: Normal sinus rhythm with frequent PVCs. The QT interval does not seem to be significantly prolonged visually although it is difficult to determine due to frequent PVCs.   ASSESSMENT AND PLAN

## 2011-09-03 NOTE — Patient Instructions (Signed)
   Stop Plavix  Begin Aspirin 81mg  daily  Increase Toprol XL 50mg  daily Your physician recommends that you go to the [facility] for lab work for CMP, TSH, Magnesium, & BNP If the results of your test are normal or stable, you will receive a letter.  If they are abnormal, the nurse will contact you by phone. Keep appointment for March

## 2011-09-03 NOTE — Assessment & Plan Note (Addendum)
This is likely multifactorial. I don't suspect cardiac ischemia. The patient clearly had chest tightness feeling in the past before his recent PCI in December. He has not had that discomfort since then. It's more likely that the dyspnea is related to underlying moderate aortic stenosis with diastolic dysfunction as well as frequent PVCs which I think are contributing. I will obtain routine labs that include CMP, TSH, magnesium level and BNP. I will increase the dose of Toprol 50 mg once daily and reevaluate his symptoms. I might have to increase the dose further 100 mg daily upon followup.

## 2011-09-03 NOTE — Assessment & Plan Note (Signed)
He has not had any recurrent chest tightness since his recent PCI. I will take him off Plavix and start aspirin 81 mg once daily. His stents were bare-metal.

## 2011-09-03 NOTE — Assessment & Plan Note (Addendum)
The patient is in sinus rhythm. He is on dofetilide 250 mcg twice daily which will be continued. Continue long-term anticoagulation with warfarin which is being managed by Dr. Sherril Croon.

## 2011-09-09 ENCOUNTER — Other Ambulatory Visit: Payer: Self-pay

## 2011-09-09 ENCOUNTER — Emergency Department (HOSPITAL_COMMUNITY): Payer: Medicare Other

## 2011-09-09 ENCOUNTER — Telehealth: Payer: Self-pay | Admitting: Cardiovascular Disease

## 2011-09-09 ENCOUNTER — Encounter (HOSPITAL_COMMUNITY): Payer: Self-pay | Admitting: *Deleted

## 2011-09-09 ENCOUNTER — Inpatient Hospital Stay (HOSPITAL_COMMUNITY)
Admission: EM | Admit: 2011-09-09 | Discharge: 2011-09-12 | DRG: 293 | Disposition: A | Discharge status: Home / Self Care | Payer: Medicare Other | Attending: Cardiology | Admitting: Cardiology

## 2011-09-09 DIAGNOSIS — R0602 Shortness of breath: Secondary | ICD-10-CM

## 2011-09-09 DIAGNOSIS — J449 Chronic obstructive pulmonary disease, unspecified: Secondary | ICD-10-CM | POA: Diagnosis present

## 2011-09-09 DIAGNOSIS — K219 Gastro-esophageal reflux disease without esophagitis: Secondary | ICD-10-CM | POA: Diagnosis present

## 2011-09-09 DIAGNOSIS — I4892 Unspecified atrial flutter: Secondary | ICD-10-CM

## 2011-09-09 DIAGNOSIS — Z87891 Personal history of nicotine dependence: Secondary | ICD-10-CM

## 2011-09-09 DIAGNOSIS — Z7982 Long term (current) use of aspirin: Secondary | ICD-10-CM

## 2011-09-09 DIAGNOSIS — I129 Hypertensive chronic kidney disease with stage 1 through stage 4 chronic kidney disease, or unspecified chronic kidney disease: Secondary | ICD-10-CM | POA: Diagnosis present

## 2011-09-09 DIAGNOSIS — J4489 Other specified chronic obstructive pulmonary disease: Secondary | ICD-10-CM | POA: Diagnosis present

## 2011-09-09 DIAGNOSIS — I2510 Atherosclerotic heart disease of native coronary artery without angina pectoris: Secondary | ICD-10-CM | POA: Diagnosis present

## 2011-09-09 DIAGNOSIS — N183 Chronic kidney disease, stage 3 unspecified: Secondary | ICD-10-CM | POA: Diagnosis present

## 2011-09-09 DIAGNOSIS — I5033 Acute on chronic diastolic (congestive) heart failure: Principal | ICD-10-CM | POA: Diagnosis present

## 2011-09-09 DIAGNOSIS — I4891 Unspecified atrial fibrillation: Secondary | ICD-10-CM | POA: Diagnosis present

## 2011-09-09 DIAGNOSIS — D649 Anemia, unspecified: Secondary | ICD-10-CM | POA: Diagnosis present

## 2011-09-09 DIAGNOSIS — I350 Nonrheumatic aortic (valve) stenosis: Secondary | ICD-10-CM

## 2011-09-09 DIAGNOSIS — I252 Old myocardial infarction: Secondary | ICD-10-CM

## 2011-09-09 DIAGNOSIS — I509 Heart failure, unspecified: Secondary | ICD-10-CM | POA: Diagnosis present

## 2011-09-09 DIAGNOSIS — R0600 Dyspnea, unspecified: Secondary | ICD-10-CM

## 2011-09-09 DIAGNOSIS — Z7901 Long term (current) use of anticoagulants: Secondary | ICD-10-CM

## 2011-09-09 DIAGNOSIS — I359 Nonrheumatic aortic valve disorder, unspecified: Secondary | ICD-10-CM | POA: Diagnosis present

## 2011-09-09 LAB — COMPREHENSIVE METABOLIC PANEL
ALT: 14 U/L (ref 0–53)
AST: 18 U/L (ref 0–37)
Albumin: 3.7 g/dL (ref 3.5–5.2)
Calcium: 9.8 mg/dL (ref 8.4–10.5)
Creatinine, Ser: 1.48 mg/dL — ABNORMAL HIGH (ref 0.50–1.35)
GFR calc non Af Amer: 41 mL/min — ABNORMAL LOW (ref >90)
Sodium: 138 mEq/L (ref 135–145)
Total Protein: 7.6 g/dL (ref 6.0–8.3)

## 2011-09-09 LAB — CK TOTAL AND CKMB (NOT AT ARMC)
CK, MB: 4.3 ng/mL — ABNORMAL HIGH (ref 0.3–4.0)
Relative Index: INVALID (ref 0.0–2.5)
Total CK: 90 U/L (ref 7–232)

## 2011-09-09 LAB — DIFFERENTIAL
Basophils Absolute: 0 10*3/uL (ref 0.0–0.1)
Basophils Relative: 1 % (ref 0–1)
Lymphocytes Relative: 27 % (ref 12–46)
Neutro Abs: 4.5 10*3/uL (ref 1.7–7.7)
Neutrophils Relative %: 61 % (ref 43–77)

## 2011-09-09 LAB — POCT I-STAT TROPONIN I: Troponin i, poc: 0.01 ng/mL (ref 0.00–0.08)

## 2011-09-09 LAB — OCCULT BLOOD, POC DEVICE: Fecal Occult Bld: NEGATIVE

## 2011-09-09 LAB — CBC
HCT: 32.7 % — ABNORMAL LOW (ref 39.0–52.0)
MCHC: 32.1 g/dL (ref 30.0–36.0)
RDW: 14.8 % (ref 11.5–15.5)
WBC: 7.3 10*3/uL (ref 4.0–10.5)

## 2011-09-09 LAB — PROTIME-INR: INR: 2.47 — ABNORMAL HIGH (ref 0.00–1.49)

## 2011-09-09 LAB — APTT: aPTT: 45 seconds — ABNORMAL HIGH (ref 24–37)

## 2011-09-09 NOTE — ED Notes (Signed)
ecg shown to edp Juleen China

## 2011-09-09 NOTE — ED Notes (Signed)
The pt has had weakness since dec when he had 2 coronary artery stents placed.  For the past 2-3 weels he has had exertional sob and he has not been able to get his strength back.  No chest pain no visible sob now.

## 2011-09-09 NOTE — ED Notes (Signed)
Pt reports that he hasn't been able to walk from his bedroom to bathroom without severe dyspnea.  Pt denies CP and SOB at this time.  NAD.  Will continue to monitor.

## 2011-09-09 NOTE — ED Provider Notes (Signed)
History     CSN: [id number]  Arrival date & time 09/09/11  [id number]   First MD Initiated Contact with Patient 09/09/11 2253      Chief Complaint  Patient presents with  . Shortness of Breath   patient reports increasing shortness of breath, and generalized weakness since coronary artery stents were placed on December 6 he did speak with his cardiologist. Last week and states his medications were adjusted. He has had no chest pain. He's had no nausea, vomiting. He has had no numbness, weakness or tingling. He does state that once he starts walking. He becomes more short of breath and feels better when he lays down. The patient actually become short of breath after only taking a few steps. The family states when ambulating to the bathroom from the bedroom. He has to sit down in a chair He also complains of palpitations, and some leg cramping. He's had no fever or cough.  The family states that the patient is having a difficult time accepting his condition.  (Consider location/radiation/quality/duration/timing/severity/associated sxs/prior treatment) HPI  Past Medical History  Diagnosis Date  . Other malaise and fatigue   . Shortness of breath   . Umbilical hernia without mention of obstruction or gangrene   . Other and unspecified hyperlipidemia   . COPD (chronic obstructive pulmonary disease)   . Hypertension   . Cramp of limb   . Atrial flutter     s/p RFCA   . Atrial fibrillation     tikosyn rx  . CAD (coronary artery disease)     s/p CABG 1997;  echo 2008: EF 55-60%  . Aortic stenosis 06/05/2011  . Chronic kidney disease     CKD  . Complication of anesthesia     " The doctor told me not to let anyone put me to sleep"  . Myocardial infarction   . GERD (gastroesophageal reflux disease)   . Arthritis     Past Surgical History  Procedure Date  . Appendectomy   . Back surgery   . Cholecystectomy   . Hernia repair   . Cardiac catheterization 06/19/2011    Moderate pulmonary  hypertension, moderate AS (valve area: 1.05), severe 3 vessel CAD. 90% stensis in both SVG to LAD/diagonal and SVG to RCA. Patent SVG to OM.   Marland Kitchen Coronary angioplasty 06/19/2011    BMS to both SVG to RCA and SVG to LAD/diagonal  . Coronary artery bypass graft     No family history on file.  History  Substance Use Topics  . Smoking status: Former Smoker -- 0.3 packs/day for 55 years    Types: Cigars    Quit date: 07/16/1995  . Smokeless tobacco: Former Neurosurgeon    Types: Chew    Quit date: 07/15/1944   Comment: chewed tobacoo for 3 years  . Alcohol Use: No      Review of Systems  All other systems reviewed and are negative.    Allergies  Review of patient's allergies indicates no known allergies.  Home Medications   Current Outpatient Rx  Name Route Sig Dispense Refill  . ASPIRIN EC 81 MG PO TBEC Oral Take 81 mg by mouth daily.    . DOFETILIDE 125 MCG PO CAPS Oral Take 250 mcg by mouth 2 (two) times daily.    . FUROSEMIDE 40 MG PO TABS Oral Take 40 mg by mouth daily.      Marland Kitchen GABAPENTIN 100 MG PO CAPS Oral Take 200 mg by mouth daily.     Marland Kitchen  ISOSORBIDE MONONITRATE ER 30 MG PO TB24 Oral Take 30 mg by mouth daily.      Marland Kitchen LISINOPRIL 5 MG PO TABS Oral Take 5 mg by mouth 2 (two) times daily.      Marland Kitchen METOPROLOL SUCCINATE ER 50 MG PO TB24 Oral Take 50 mg by mouth daily. Take with or immediately following a meal.    . NITROGLYCERIN 0.4 MG SL SUBL Sublingual Place 0.4 mg under the tongue every 5 (five) minutes as needed. For chest pain     . POTASSIUM CHLORIDE CRYS ER 20 MEQ PO TBCR Oral Take 20 mEq by mouth daily.      . WARFARIN SODIUM 5 MG PO TABS Oral Take 5-7.5 mg by mouth daily. Patient reports: 5mg  daily except 7.5mg  Monday, Wednesday, Friday.     Marland Kitchen ZOLPIDEM TARTRATE 10 MG PO TABS Oral Take 10 mg by mouth at bedtime as needed. For sleep       BP 159/55  Pulse 80  Temp(Src) 97.6 F (36.4 C) (Oral)  Resp 18  SpO2 96%  Physical Exam  Nursing note and vitals  reviewed. Constitutional: He is oriented to person, place, and time. He appears well-developed and well-nourished.  HENT:  Head: Normocephalic and atraumatic.  Eyes: Conjunctivae and EOM are normal. Pupils are equal, round, and reactive to light.  Neck: Neck supple.  Cardiovascular: Normal rate and regular rhythm.  Exam reveals no gallop and no friction rub.   No murmur heard. Pulmonary/Chest: He has no wheezes. He has rales. He exhibits no tenderness.       Few scattered crackles in the bases of the lungs otherwise clear  Abdominal: Soft. Bowel sounds are normal. He exhibits no distension. There is no tenderness. There is no rebound and no guarding.  Musculoskeletal: Normal range of motion. He exhibits edema.       1+ pitting edema up to the midshin  Neurological: He is alert and oriented to person, place, and time. No cranial nerve deficit. Coordination normal.  Skin: Skin is warm and dry. No rash noted.  Psychiatric: He has a normal mood and affect.    ED Course  Procedures (including critical care time)  Labs Reviewed  CBC - Abnormal; Notable for the following:    RBC 3.61 (*)    Hemoglobin 10.5 (*)    HCT 32.7 (*)    All other components within normal limits  CK TOTAL AND CKMB - Abnormal; Notable for the following:    CK, MB 4.3 (*)    All other components within normal limits  COMPREHENSIVE METABOLIC PANEL - Abnormal; Notable for the following:    Glucose, Bld 134 (*)    BUN 29 (*)    Creatinine, Ser 1.48 (*)    GFR calc non Af Amer 41 (*)    GFR calc Af Amer 47 (*)    All other components within normal limits  PROTIME-INR - Abnormal; Notable for the following:    Prothrombin Time 27.2 (*)    INR 2.47 (*)    All other components within normal limits  APTT - Abnormal; Notable for the following:    aPTT 45 (*)    All other components within normal limits  DIFFERENTIAL  POCT I-STAT TROPONIN I   Dg Chest 2 View  09/09/2011  *RADIOLOGY REPORT*  Clinical Data:  Shortness of breath on exertion.  History of hypertension, COPD, CAD, eighth rib, MI, CABG and stents.  History of smoking.  CHEST - 2 VIEW  Comparison:  06/19/2011  Findings: The patient has had median sternotomy.  Heart is enlarged.  There is prominence of interstitial markings which appears stable.  No focal consolidations are identified.  There is posterior costophrenic angle blunting.  Surgical clips are identified in the right upper quadrant.  IMPRESSION:  1.  Cardiomegaly 2. Suspect chronic, interstitial lung disease.  Original Report Authenticated By: Patterson Hammersmith, M.D.     No diagnosis found.Patient is seen and examined, initial history and physical is completed. Evaluation initiated    MDM  Patient is seen and examined, initial history and physical is completed. Evaluation initiated    Results for orders placed during the hospital encounter of 09/09/11  CBC      Component Value Range   WBC 7.3  4.0 - 10.5 (K/uL)   RBC 3.61 (*) 4.22 - 5.81 (MIL/uL)   Hemoglobin 10.5 (*) 13.0 - 17.0 (g/dL)   HCT 16.1 (*) 09.6 - 52.0 (%)   MCV 90.6  78.0 - 100.0 (fL)   MCH 29.1  26.0 - 34.0 (pg)   MCHC 32.1  30.0 - 36.0 (g/dL)   RDW 04.5  40.9 - 81.1 (%)   Platelets 165  150 - 400 (K/uL)  DIFFERENTIAL      Component Value Range   Neutrophils Relative 61  43 - 77 (%)   Neutro Abs 4.5  1.7 - 7.7 (K/uL)   Lymphocytes Relative 27  12 - 46 (%)   Lymphs Abs 2.0  0.7 - 4.0 (K/uL)   Monocytes Relative 9  3 - 12 (%)   Monocytes Absolute 0.7  0.1 - 1.0 (K/uL)   Eosinophils Relative 2  0 - 5 (%)   Eosinophils Absolute 0.1  0.0 - 0.7 (K/uL)   Basophils Relative 1  0 - 1 (%)   Basophils Absolute 0.0  0.0 - 0.1 (K/uL)  CK TOTAL AND CKMB      Component Value Range   Total CK 90  7 - 232 (U/L)   CK, MB 4.3 (*) 0.3 - 4.0 (ng/mL)   Relative Index RELATIVE INDEX IS INVALID  0.0 - 2.5   COMPREHENSIVE METABOLIC PANEL      Component Value Range   Sodium 138  135 - 145 (mEq/L)   Potassium 4.5  3.5 -  5.1 (mEq/L)   Chloride 104  96 - 112 (mEq/L)   CO2 24  19 - 32 (mEq/L)   Glucose, Bld 134 (*) 70 - 99 (mg/dL)   BUN 29 (*) 6 - 23 (mg/dL)   Creatinine, Ser 9.14 (*) 0.50 - 1.35 (mg/dL)   Calcium 9.8  8.4 - 78.2 (mg/dL)   Total Protein 7.6  6.0 - 8.3 (g/dL)   Albumin 3.7  3.5 - 5.2 (g/dL)   AST 18  0 - 37 (U/L)   ALT 14  0 - 53 (U/L)   Alkaline Phosphatase 81  39 - 117 (U/L)   Total Bilirubin 0.5  0.3 - 1.2 (mg/dL)   GFR calc non Af Amer 41 (*) >90 (mL/min)   GFR calc Af Amer 47 (*) >90 (mL/min)  PROTIME-INR      Component Value Range   Prothrombin Time 27.2 (*) 11.6 - 15.2 (seconds)   INR 2.47 (*) 0.00 - 1.49   APTT      Component Value Range   aPTT 45 (*) 24 - 37 (seconds)  POCT I-STAT TROPONIN I      Component Value Range   Troponin i, poc 0.01  0.00 - 0.08 (  ng/mL)   Comment 3            Dg Chest 2 View  09/09/2011  *RADIOLOGY REPORT*  Clinical Data: Shortness of breath on exertion.  History of hypertension, COPD, CAD, eighth rib, MI, CABG and stents.  History of smoking.  CHEST - 2 VIEW  Comparison: 06/19/2011  Findings: The patient has had median sternotomy.  Heart is enlarged.  There is prominence of interstitial markings which appears stable.  No focal consolidations are identified.  There is posterior costophrenic angle blunting.  Surgical clips are identified in the right upper quadrant.  IMPRESSION:  1.  Cardiomegaly 2. Suspect chronic, interstitial lung disease.  Original Report Authenticated By: Patterson Hammersmith, M.D.      Date: 09/09/2011  Rate: 71  Rhythm: atrial fibrillation  QRS Axis: normal  Intervals: normal  ST/T Wave abnormalities: nonspecific ST/T changes  Conduction Disutrbances:none  Narrative Interpretation:   Old EKG Reviewed: changes noted  Lab studies, and chest x-ray have been reviewed by myself and by radiologist. Chest x-ray showed cardiomegaly and chronic interstitial lung disease. There was no report of any CHF or any opacity.  Troponin was  0.01. White count was normal. Hemoglobin was 10.5. Previous hemoglobins were in the range of 1112 and 11.5.  Electrolytes were normal. Liver function tests normal. INR was therapeutic at 2.47.   Will speak with the on-call Adolph Pollack cardiologist to discuss plan.  11:18 PM  Discuss with cardiology they did not have any specific recommendations and also to outpatient followup would be reasonable. We'll check a stool guaiac in relation to his slightly low hemoglobin. We'll reassess and provide appropriate disposition      Roselina Burgueno A. Patrica Duel, MD 09/10/11 2259

## 2011-09-09 NOTE — Telephone Encounter (Signed)
Patient called wanting you to see him today or tomorrow I told him we could see him Thursday he said to ask if you could admit him to [facility] to see what is wrong with his leggs can't  Walk good

## 2011-09-09 NOTE — ED Notes (Signed)
Pt report given and care endorsed to Onalee Hua, [place]. Dr. Margo Aye at the bedside to evaluate pt for admission.

## 2011-09-10 ENCOUNTER — Other Ambulatory Visit: Payer: Self-pay

## 2011-09-10 ENCOUNTER — Ambulatory Visit: Payer: Medicare Other | Admitting: Cardiovascular Disease

## 2011-09-10 DIAGNOSIS — I359 Nonrheumatic aortic valve disorder, unspecified: Secondary | ICD-10-CM

## 2011-09-10 LAB — TSH: TSH: 3.127 u[IU]/mL (ref 0.350–4.500)

## 2011-09-10 MED ORDER — SODIUM CHLORIDE 0.9 % IJ SOLN
3.0000 mL | Freq: Two times a day (BID) | INTRAMUSCULAR | Status: DC
  Administered 2011-09-10 – 2011-09-12 (×6): 3 mL via INTRAVENOUS

## 2011-09-10 MED ORDER — WARFARIN SODIUM 5 MG PO TABS: 5.0000 mg | ORAL_TABLET | Freq: Every day | ORAL | Status: DC

## 2011-09-10 MED ORDER — LISINOPRIL 5 MG PO TABS
5.0000 mg | ORAL_TABLET | Freq: Two times a day (BID) | ORAL | Status: DC
  Administered 2011-09-10 – 2011-09-12 (×5): 5 mg via ORAL
  Filled 2011-09-10 (×6): qty 1

## 2011-09-10 MED ORDER — NITROGLYCERIN 0.4 MG SL SUBL: 0.4000 mg | SUBLINGUAL_TABLET | SUBLINGUAL | Status: DC | PRN

## 2011-09-10 MED ORDER — ACETAMINOPHEN 325 MG PO TABS: 650.0000 mg | ORAL_TABLET | ORAL | Status: DC | PRN

## 2011-09-10 MED ORDER — GABAPENTIN 100 MG PO CAPS
200.0000 mg | ORAL_CAPSULE | Freq: Every day | ORAL | Status: DC
  Administered 2011-09-10 – 2011-09-12 (×3): 200 mg via ORAL
  Filled 2011-09-10 (×3): qty 2

## 2011-09-10 MED ORDER — FUROSEMIDE 20 MG PO TABS
40.0000 mg | ORAL_TABLET | Freq: Every day | ORAL | Status: DC
  Filled 2011-09-10: qty 2

## 2011-09-10 MED ORDER — ZOLPIDEM TARTRATE 5 MG PO TABS
5.0000 mg | ORAL_TABLET | Freq: Every evening | ORAL | Status: DC | PRN
  Administered 2011-09-10 – 2011-09-12 (×2): 5 mg via ORAL
  Filled 2011-09-10 (×2): qty 1

## 2011-09-10 MED ORDER — METOPROLOL SUCCINATE ER 50 MG PO TB24
50.0000 mg | ORAL_TABLET | Freq: Every day | ORAL | Status: DC
  Administered 2011-09-10 – 2011-09-12 (×3): 50 mg via ORAL
  Filled 2011-09-10 (×3): qty 1

## 2011-09-10 MED ORDER — ASPIRIN EC 81 MG PO TBEC
81.0000 mg | DELAYED_RELEASE_TABLET | Freq: Every day | ORAL | Status: DC
  Administered 2011-09-10 – 2011-09-12 (×3): 81 mg via ORAL
  Filled 2011-09-10 (×3): qty 1

## 2011-09-10 MED ORDER — FUROSEMIDE 40 MG PO TABS
40.0000 mg | ORAL_TABLET | Freq: Every day | ORAL | Status: DC
  Filled 2011-09-10: qty 1

## 2011-09-10 MED ORDER — DOFETILIDE 250 MCG PO CAPS
250.0000 ug | ORAL_CAPSULE | Freq: Two times a day (BID) | ORAL | Status: DC
  Administered 2011-09-10 – 2011-09-12 (×5): 250 ug via ORAL
  Filled 2011-09-10 (×6): qty 1

## 2011-09-10 MED ORDER — POTASSIUM CHLORIDE CRYS ER 20 MEQ PO TBCR
20.0000 meq | EXTENDED_RELEASE_TABLET | Freq: Two times a day (BID) | ORAL | Status: DC
  Administered 2011-09-10 – 2011-09-12 (×5): 20 meq via ORAL
  Filled 2011-09-10 (×6): qty 1

## 2011-09-10 MED ORDER — SODIUM CHLORIDE 0.9 % IV SOLN: 250.0000 mL | INTRAVENOUS | Status: DC | PRN

## 2011-09-10 MED ORDER — WARFARIN SODIUM 7.5 MG PO TABS
7.5000 mg | ORAL_TABLET | ORAL | Status: DC
  Filled 2011-09-10: qty 1

## 2011-09-10 MED ORDER — ONDANSETRON HCL 4 MG/2ML IJ SOLN: 4.0000 mg | Freq: Four times a day (QID) | INTRAMUSCULAR | Status: DC | PRN

## 2011-09-10 MED ORDER — FUROSEMIDE 10 MG/ML IJ SOLN
40.0000 mg | Freq: Two times a day (BID) | INTRAMUSCULAR | Status: DC
  Administered 2011-09-10 – 2011-09-11 (×4): 40 mg via INTRAVENOUS
  Filled 2011-09-10 (×7): qty 4

## 2011-09-10 MED ORDER — ASPIRIN EC 81 MG PO TBEC: 81.0000 mg | DELAYED_RELEASE_TABLET | Freq: Every day | ORAL | Status: DC

## 2011-09-10 MED ORDER — WARFARIN SODIUM 5 MG PO TABS
5.0000 mg | ORAL_TABLET | ORAL | Status: AC
  Administered 2011-09-10: 5 mg via ORAL
  Filled 2011-09-10: qty 1

## 2011-09-10 MED ORDER — SODIUM CHLORIDE 0.9 % IJ SOLN: 3.0000 mL | INTRAMUSCULAR | Status: DC | PRN

## 2011-09-10 MED ORDER — LISINOPRIL 5 MG PO TABS
5.0000 mg | ORAL_TABLET | Freq: Two times a day (BID) | ORAL | Status: DC
  Filled 2011-09-10 (×2): qty 1

## 2011-09-10 MED ORDER — METOPROLOL SUCCINATE ER 50 MG PO TB24: 50.0000 mg | ORAL_TABLET | Freq: Every day | ORAL | Status: DC

## 2011-09-10 MED ORDER — ISOSORBIDE MONONITRATE ER 30 MG PO TB24
30.0000 mg | ORAL_TABLET | Freq: Every day | ORAL | Status: DC
  Administered 2011-09-10 – 2011-09-11 (×2): 30 mg via ORAL
  Filled 2011-09-10 (×2): qty 1

## 2011-09-10 NOTE — ED Notes (Signed)
Pt. Alert and oriented, transferred to the floor via stretcher with cardiac monitor and tele tech

## 2011-09-10 NOTE — Progress Notes (Signed)
  Echocardiogram 2D Echocardiogram has been performed.  Juan Wolf 09/10/2011, 4:23 PM

## 2011-09-10 NOTE — Progress Notes (Signed)
   Patient seen and examined on rounds. Dr. Scharlene Gloss admit note from earlier this am reviewed.  Juan Wolf 78 y/o male followed by Dr. Kirke Corin. With h/o AF in NSR on Tikosyn, CAD s/p 2-v PCI in 12/12. Also with known AS moderate to severe by echo and cath.  Cath 12/12  RA 13  PA 57/20 (37) PCWP 22 Fick 5.1/2.5 AVA 1.05 cm2 Mean 27  Echo 11/12 @ [facility] EF 55-60%  AVA 0.95cm2 mean 27  Admitted with progressive DOE now with Class III-IIIB symptoms. Trop negative. Denies CP.  Since wife died in Sep 15, 2022 has been eating beans, rice, cornbread, etc.  On exam,  Neck veins up to jaw with 1-2+ pedal edema.  Suspect dyspnea is multifactorial but current decompensation likely due to volume overload. Will proceed with IV diuresis. Will need HF education about dietary choices. Can have nutritionist see. Check BNP.  Truman Hayward 9:39 AM

## 2011-09-10 NOTE — Telephone Encounter (Signed)
Return call to [facility] was call to clarify the dosage to Lisinopril 5 mg. Pt went to pharmacy stated he was taking  The 10 mg tablet a day. The correct dosage was Lisinopril 5 mg ; take 1 tablet by mouth  two times a day.LVM on pt home phone to call office back.

## 2011-09-10 NOTE — H&P (Signed)
NAME:  Juan Wolf, Juan Wolf NO.:  [id number]  MEDICAL RECORD NO.:  [id number]  LOCATION:  PDA13                        FACILITY:  [facility]  PHYSICIAN:  Natasha Bence, MD       DATE OF BIRTH:  10-07-1921  DATE OF ADMISSION:  09/09/2011 DATE OF DISCHARGE:                             HISTORY & PHYSICAL   PRIMARY CARDIOLOGIST:  [name], MD  CHIEF COMPLAINT:  Dyspnea on exertion.  HISTORY OF PRESENT ILLNESS:  Juan Wolf is an 78 year old white male with a history of coronary artery disease and atrial flutter status post ablation, also with hypertension and COPD and aortic stenosis, who presents with worsening dyspnea on exertion.  He had been having angina in December.  Previously on angiogram was found to have occlusive disease of his vein grafts which he had 2 bare metal stents placed in that month.  He was on Plavix for some time and then had to stop due to some nosebleeds.  He reports continued dyspnea on exertion since his procedure.  He denies any chest discomfort.  He denies any PND or orthopnea.  He does have occasional palpitations but no lightheadedness or dizziness.  He does have chronic lower extremity edema which is unchanged.  He attributes shortness of breath worsening during his catheterization procedure.  Otherwise he denies any complaints.  He denies any recent wheezing.  He does have a cough and some nasal congestion, but no fevers, chills, or sweats.  REVIEW OF SYSTEMS:  GENERAL:  No fever, chills, or sweats.  No weight loss.  SKIN:  No rash or ulcers.  HPI:  See above.  CARDIOVASCULAR:  See above.  GU:  No dysuria, hematuria.  GI:  No melena or hematochezia.  No nausea, vomiting, or diarrhea.  MUSCULOSKELETAL:  No complaints. Otherwise complete review of systems was performed and was negative except for what is stated above.  PAST MEDICAL HISTORY: 1. Coronary artery disease status post bypass in 1997 and then PCI x2     to the saphenous vein  graft December 2012. 2. Atrial flutter status post ablation. 3. Hypertension. 4. COPD. 5. History of atrial fibrillation, on Tikosyn. 6. Moderate aortic stenosis. 7. Chronic kidney disease. 8. GERD. 9. Arthritis.  PAST SURGICAL HISTORY:  Appendectomy, back surgery, cholecystectomy, hernia repair, and bypass grafting.  SOCIAL HISTORY:  His wife passed away.  He is a former smoker, smoked about a 3rd of a pack a day for over 50 years.  No alcohol or illicit drug use.  ALLERGIES:  He has no known drug allergies.  MEDS: 1. Aspirin 81 mg p.o. daily. 2. Dofetilide 250 mcg p.o. b.i.d. 3. Lasix 40 mg p.o. daily. 4. Neurontin 200 mg p.o. daily. 5. Imdur 30 mg p.o. daily. 6. Lisinopril 5 mg p.o. b.i.d. 7. Toprol-XL 50 mg p.o. daily. 8. Nitroglycerin p.r.n. 9. Potassium chloride 20 mEq p.o. daily. 10.Coumadin, he takes 5 mg every day except he takes 7.5 mg on Monday,     Wednesday, and Friday. 11.Ambien 10 mg p.o. daily.  PHYSICAL EXAMINATION:  VITAL SIGNS:  Afebrile.  Temperature 97.6, pulse was 71 and regular, blood pressure 171/80, respiratory rate of 22, O2 sats 99% on room  air. GENERAL:  He is well-developed elderly white male, in no apparent distress.  HEENT:  Eyes:  Anicteric sclerae.  Pupils equally round and reactive to light.  Mucous membranes are moist. NECK:  He has no carotid bruits.  Normal jugular venous pressure. LUNGS:  He has dry crackles at both bases.  No wheezes, no rales. CARDIOVASCULAR:  He has a regular rate and rhythm with occasional ectopy.  He has a 3/6 systolic crescendo decrescendo murmur, heard best at the right upper sternal border and radiating to carotids. ABDOMEN:  Soft, nontender, nondistended.  No bruits. EXTREMITIES:  Warm.  He has trace pitting edema to his mid shins bilaterally.  Extremities were warm. NEUROLOGIC:  Grossly afocal. SKIN:  No rash or ulcers.  LABS:  White blood cell count 7.3, hematocrit 32.7, platelet count of 165,000.   Sodium 138, potassium 4.5, chloride of 104, bicarb of 24, BUN of 29, creatinine of 1.48, glucose of 134.  CK is 4.3, troponin was 0.01.  CK-MB was 4.3, CK was 90 and troponin was 0.1.  Fecal occult blood testing was negative.  Chest x-ray showed cardiomegaly and chronic interstitial lung disease.  His EKG shows sinus mechanism with first- degree AV block.  No acute ischemic EKG changes.  IMPRESSION AND PLAN:  This is an 78 year old male with multiple cardiac conditions including multivessel coronary disease, probably moderate aortic stenosis, and history of atrial fibrillation, who presents with worsening dyspnea on exertion.  He appears fairly euvolemic on exam.  He does have physical findings suggestive of aortic stenosis on exam and his catheterization numbers are consistent with moderate aortic stenosis.  I do not see a recent echocardiogram in our system.  We will repeat 1 in the morning, also look at his other valves and his LV function.  He probably has some degree of diastolic dysfunction which is contributory.  Currently he is in sinus rhythm on his Tikosyn.  We will continue his current dose.  He does have a history of recent PCI to the saphenous vein grafts, which could potentially be contributory to consider stress testing to rule out ischemia, however, he does not have any anginal symptoms that he was having prior to the procedure.  Of note, he does have dry crackles on exam and has a strong history of smoking.  Something to consider would be a more thorough pulmonary evaluation with possibly a chest CT to rule out interstitial lung disease.  For now we will admit him for further evaluation.  Family says he is way too dyspneic to get around the house.  He does not appear overtly volume overloaded, so I did not feel he needs additional diuresis for now.  We will check a BNP as well as a TSH.  He is mildly anemic and was Hemoccult-negative in the ED.  We will continue his  current Coumadin dose for his atrial fibrillation. Further recommendations will be made based on some of these above results.          ______________________________ Natasha Bence, MD     MH/MEDQ  D:  09/10/2011  T:  09/10/2011  Job:  [id number]

## 2011-09-10 NOTE — Plan of Care (Signed)
Problem: Food- and Nutrition-Related Knowledge Deficit (NB-1.1) Goal: Nutrition education Formal process to instruct or train a patient/client in a skill or to impart knowledge to help patients/clients voluntarily manage or modify food choices and eating behavior to maintain or improve health.  Outcome: Completed/Met Date Met:  09/10/11 RD was consulted to provide CHF diet education. Patient does not use salt with cooking or on the table at home. Does not follow a low salt diet when eating out. Eats lunch daily at a "soul food" restaurant, eats several vegetables and corn bread or muffins. RD went over low sodium diet information for eating at home and eating meals out. Patient was agreeable to trying to eat out less frequently and cook more meals at home. Patient also agreed to talk with restaurant about lower sodium options when he does eat out. Patient verbalized understanding of diet, RD expects fair compliance. Patient does not weigh himself everyday, RD encouraged daily weights.Chart reviewed, no additional nutrition interventions at this time. Please re-consult if additional needs arise.   Juan Wolf

## 2011-09-10 NOTE — H&P (Signed)
See dictated note.  Admitted with worsening DOE.  Likely multifactorial (CAD, ?Diastolic HF, moderate AS) +/- lung disease.  ECHO in am.  Consider more thorough pulmonary eval (PFT's, CT to r/o interstitial lung disease) vs ischemia eval although less likely given symptoms.  Appears euvolemic.

## 2011-09-10 NOTE — Progress Notes (Signed)
09/10/11 1715 UR COMPLETED. Tera Mater, RN, BSN [phone]

## 2011-09-11 ENCOUNTER — Other Ambulatory Visit: Payer: Self-pay

## 2011-09-11 ENCOUNTER — Telehealth: Payer: Self-pay | Admitting: *Deleted

## 2011-09-11 DIAGNOSIS — I4892 Unspecified atrial flutter: Secondary | ICD-10-CM

## 2011-09-11 DIAGNOSIS — I2510 Atherosclerotic heart disease of native coronary artery without angina pectoris: Secondary | ICD-10-CM

## 2011-09-11 DIAGNOSIS — I359 Nonrheumatic aortic valve disorder, unspecified: Secondary | ICD-10-CM

## 2011-09-11 LAB — BASIC METABOLIC PANEL
BUN: 31 mg/dL — ABNORMAL HIGH (ref 6–23)
Calcium: 9.4 mg/dL (ref 8.4–10.5)
Chloride: 106 mEq/L (ref 96–112)
Creatinine, Ser: 1.64 mg/dL — ABNORMAL HIGH (ref 0.50–1.35)
GFR calc Af Amer: 42 mL/min — ABNORMAL LOW (ref >90)

## 2011-09-11 LAB — PRO B NATRIURETIC PEPTIDE: Pro B Natriuretic peptide (BNP): 2810 pg/mL — ABNORMAL HIGH (ref 0–450)

## 2011-09-11 MED ORDER — WARFARIN - PHARMACIST DOSING INPATIENT
Freq: Every day | Status: DC
  Filled 2011-09-11 (×2): qty 1

## 2011-09-11 MED ORDER — WARFARIN SODIUM 7.5 MG PO TABS
7.5000 mg | ORAL_TABLET | ORAL | Status: DC
  Administered 2011-09-11: 7.5 mg via ORAL
  Filled 2011-09-11: qty 1

## 2011-09-11 MED ORDER — WARFARIN SODIUM 5 MG PO TABS
5.0000 mg | ORAL_TABLET | ORAL | Status: DC
  Filled 2011-09-11: qty 1

## 2011-09-11 MED ORDER — WARFARIN - PHYSICIAN DOSING INPATIENT
Freq: Every day | Status: DC
  Filled 2011-09-11: qty 1

## 2011-09-11 NOTE — Progress Notes (Signed)
I have seen and examined the patient. I agree with the above note with the addition of:  The patient feels 100% better. He was able to walk down the hallway with me without dyspnea.  He still have some bibasilar crackles. His dyspnea was due to heart failure.  Due to aortic stenosis, I recommend avoiding over diuresis. Continue IV Lasix for today. I think he can be discharged home tomorrow if he remains stable.  I will stop Imdur due to relatively low BP and lack of angina.  Increase home Lasix dose to 40 mg bid. Obtain BMP 1 week after discharge.  He already has an appointment with me in March.   [name] 09/11/2011 5:16 PM

## 2011-09-11 NOTE — Telephone Encounter (Signed)
Message left on voicemail that lab work was fine and office  Aware that patient was seen at ED.

## 2011-09-11 NOTE — Telephone Encounter (Signed)
Message copied by Eustace Moore on Wed Sep 11, 2011  2:40 PM ------      Message from: [name] A      Created: Fri Sep 06, 2011  3:41 PM       Inform patient that his labs were fine. If he is still feeling out of breath, ask him to come for an ECG and BP check.

## 2011-09-11 NOTE — Progress Notes (Signed)
Cardiology Progress Note Patient Name: Juan Wolf Date of Encounter: 09/11/2011, 12:19 PM     Subjective  No overnight events. Patient with significant improvement in his breathing. Denies chest pain. Has ambulated in his room with dyspnea, but thinks it is improving.   Objective   Telemetry: sinus rhythm vs junctional vs a. Fib 50s-70s, PVCs EKG 09/11/11 @ - A. Fib w/ PVCs but am unclear if this is accurate. It appears to be either coarse a.fib w/ occ sinus beats or ectopic atrial rhythm w/ ectopy.   Medications: . aspirin EC  81 mg Oral Daily  . dofetilide  250 mcg Oral BID  . furosemide  40 mg Intravenous BID  . gabapentin  200 mg Oral Daily  . isosorbide mononitrate  30 mg Oral Daily  . lisinopril  5 mg Oral BID  . metoprolol succinate  50 mg Oral Daily  . potassium chloride  20 mEq Oral BID  . sodium chloride  3 mL Intravenous Q12H  . warfarin  5 mg Oral Custom  . warfarin  7.5 mg Oral Custom   Physical Exam: Temp:  [97.2 F (36.2 C)-97.8 F (36.6 C)] 97.8 F (36.6 C) (02/27 1037) Pulse Rate:  [62-69] 69  (02/27 1037) Resp:  [16-20] 18  (02/27 1037) BP: (102-136)/(45-63) 136/45 mmHg (02/27 1037) SpO2:  [95 %-100 %] 95 % (02/27 1037) Weight:  [195 lb 5.2 oz (88.6 kg)] 195 lb 5.2 oz (88.6 kg) (02/27 0272)  General: Pleasant elderly male, in no acute distress. Head: Normocephalic, atraumatic, sclera non-icteric, nares are without discharge.  Neck: Supple. Negative for carotid bruits. JVD to mid neck Lungs: Rales bilat 1/3 lung fields posteriorly. No wheezes or rhonchi. Breathing is unlabored. Heart: RRR with extra systoles S1 S2 3/6 SEM RUSB No rubs, or gallops.  Abdomen: Soft, non-tender, non-distended with normoactive bowel sounds. No rebound/guarding. No obvious abdominal masses. Msk:  Strength and tone appear normal for age. Extremities: Trace - 1+ BLE edema. No clubbing or cyanosis. Distal pedal pulses are intact and equal bilaterally. Neuro: Alert and  oriented X 3. Moves all extremities spontaneously. Psych:  Responds to questions appropriately with a normal affect.   Intake/Output Summary (Last 24 hours) at 09/11/11 1219 Last data filed at 09/11/11 1100  Gross per 24 hour  Intake    844 ml  Output   3751 ml  Net  -2907 ml    Labs:  [facility] 09/11/11 0522 09/09/11 2052  NA 142 138  K 4.2 4.5  CL 106 104  CO2 28 24  GLUCOSE 89 134*  BUN 31* 29*  CREATININE 1.64* 1.48*  CALCIUM 9.4 9.8  MG 2.5 --   Basename 09/09/11 2052  AST 18  ALT 14  ALKPHOS 81  BILITOT 0.5  PROT 7.6  ALBUMIN 3.7   Basename 09/09/11 2052  WBC 7.3  NEUTROABS 4.5  HGB 10.5*  HCT 32.7*  MCV 90.6  PLT 165   Basename 09/09/11 2052  CKTOTAL 90  CKMB 4.3*  TROPONINI --    09/09/2011 21:07  Troponin i, poc 0.01   Basename 09/10/11 0332  TSH 3.127    09/11/2011 05:22  Pro B Natriuretic peptide (BNP) 2810.0 (H)     09/09/2011 23:49  Fecal Occult Blood, POC NEGATIVE    09/11/2011 05:22  Prothrombin Time 30.1 (H)  INR 2.82 (H)    Radiology/Studies:   09/10/11 - 2D Echocardiogram Study Conclusions:  - Left ventricle: The cavity size was normal. Wall  thickness was increased in a pattern of mild LVH. Systolic function was normal. The estimated ejection fraction was in the range of 55% to 60%.  - Aortic valve: There was moderate stenosis. Mild regurgitation. Valve area: 0.68cm^2(VTI). Valve area: 0.67cm^2 (Vmax). - Mitral valve: Mild to moderate regurgitation. - Left atrium: The atrium was moderately dilated. - Atrial septum: No defect or patent foramen ovale was identified. - Pulmonary arteries: PA peak pressure: 37mm Hg (S).  09/09/2011 - CXR Findings: The patient has had median sternotomy.  Heart is enlarged.  There is prominence of interstitial markings which appears stable.  No focal consolidations are identified.  There is posterior costophrenic angle blunting.  Surgical clips are identified in the right upper quadrant.  IMPRESSION:  1.   Cardiomegaly 2. Suspect chronic, interstitial lung disease.     Assessment and Plan  78 y.o. male w/ PMHx significant for CAD s/p CABG '97 & BMS x2 to SVG Dec '12, a.flutter s/p ablation, a.fib on Tikosyn, Aortic stenosis, CKD, and COPD who presented to [facility] on 09/09[facility]11 with complaints of progressive DOE.  1. Dyspnea: Likely multifactorial with cardiac and pulmonary etiologies, but current decompensation felt likely due to volume overload likely r/t dietary indiscretions. Echo reveals mild LVH, EF 55-60%, mod AS, mild AI, mild-mod MR, mod dilated LA, PA peak pressure . proBNP 2810 (was 3013 06/21/11). Diuresed ~3L over last 24hrs, weight down 5lbs on Lasix 40mg  IV bid. His symptoms have improved significantly, but still with some DOE, elevated JVD and rales.  2. CAD: Had 2 BMS placed to SVG in Dec '12. Stopped Plavix 2/2 epistaxis. Cardiac enzymes negative. EKG without signs of acute ischemia. Denies chest pain. Cont ASA, BB, Imdur. Not sure why he is not on a statin. Consider adding  3. A.Fib: On telemetry he appears to be in sinus rhythm vs junctional w/ HRs 50s-70s with frequent PAC/PVCs. EKG reading says A. Fib w/ PVCs but am unclear if this is accurate. It appears to be either coarse a.fib w/ occ sinus beats or ectopic atrial rhythm w/ ectopy. On Tikosyn and Coumadin. INR 2.82.   4. Aortic Stenosis: History of mod-severe AS. Cath 12/12 showed AVA 1.05cm, Mean 27. Echo yesterday shows mod AS w/ AV area 0.68cm. Per clinic notes he is not a good candidate for AVR but might be a candidate for TAVR if AS worsens.  5. CKD, Stage 3: Crt 1.48 on presentation, 1.64 this am after diuresis. Monitor closely with diuresis. Consider holding ACEI  6. COPD: He has a strong history of smoking. CXR shows prominence of interstitial markings, suspect chronic interstitial lung dz. Consider a more thorough pulmonary evaluation with PFTs and possible chest CT to r/o interstitial lung  dz.    Signed, Melania Kirks PA-C

## 2011-09-11 NOTE — Progress Notes (Signed)
ANTICOAGULATION CONSULT NOTE - Initial Consult  Pharmacy Consult for Coumadin Indication: atrial fibrillation  No Known Allergies  Patient Measurements: Weight: 195 lb 5.2 oz (88.6 kg) (a scale)   Vital Signs: Temp: 97.8 F (36.6 C) (02/27 1037) BP: 136/45 mmHg (02/27 1037) Pulse Rate: 69  (02/27 1037)  Labs:  Basename 09/11/11 0522 09/09/11 2052  HGB -- 10.5*  HCT -- 32.7*  PLT -- 165  APTT -- 45*  LABPROT 30.1* 27.2*  INR 2.82* 2.47*  HEPARINUNFRC -- --  CREATININE 1.64* 1.48*  CKTOTAL -- 90  CKMB -- 4.3*  TROPONINI -- --   The CrCl is unknown because both a height and weight (above a minimum accepted value) are required for this calculation.  Medical History: Past Medical History  Diagnosis Date  . Other malaise and fatigue   . Shortness of breath   . Umbilical hernia without mention of obstruction or gangrene   . Other and unspecified hyperlipidemia   . COPD (chronic obstructive pulmonary disease)   . Hypertension   . Cramp of limb   . Atrial flutter     s/p RFCA   . Atrial fibrillation     tikosyn rx  . CAD (coronary artery disease)     s/p CABG 1997;  echo 2008: EF 55-60%  . Aortic stenosis 06/05/2011  . Chronic kidney disease     CKD  . Complication of anesthesia     " The doctor told me not to let anyone put me to sleep"  . Myocardial infarction   . GERD (gastroesophageal reflux disease)   . Arthritis     Medications:  Prescriptions prior to admission  Medication Sig Dispense Refill  . aspirin EC 81 MG tablet Take 81 mg by mouth daily.      Marland Kitchen dofetilide (TIKOSYN) 125 MCG capsule Take 250 mcg by mouth 2 (two) times daily.      . furosemide (LASIX) 40 MG tablet Take 40 mg by mouth daily.        Marland Kitchen gabapentin (NEURONTIN) 100 MG capsule Take 200 mg by mouth daily.       . isosorbide mononitrate (IMDUR) 30 MG 24 hr tablet Take 30 mg by mouth daily.        Marland Kitchen lisinopril (PRINIVIL,ZESTRIL) 5 MG tablet Take 5 mg by mouth 2 (two) times daily.        .  metoprolol succinate (TOPROL-XL) 50 MG 24 hr tablet Take 50 mg by mouth daily. Take with or immediately following a meal.      . nitroGLYCERIN (NITROSTAT) 0.4 MG SL tablet Place 0.4 mg under the tongue every 5 (five) minutes as needed. For chest pain       . potassium chloride SA (K-DUR,KLOR-CON) 20 MEQ tablet Take 20 mEq by mouth daily.        Marland Kitchen warfarin (COUMADIN) 5 MG tablet Take 5-7.5 mg by mouth daily. Patient reports: 5mg  daily except 7.5mg  Monday, Wednesday, Friday.       . zolpidem (AMBIEN) 10 MG tablet Take 10 mg by mouth at bedtime as needed. For sleep        Scheduled:    . aspirin EC  81 mg Oral Daily  . dofetilide  250 mcg Oral BID  . furosemide  40 mg Intravenous BID  . gabapentin  200 mg Oral Daily  . isosorbide mononitrate  30 mg Oral Daily  . lisinopril  5 mg Oral BID  . metoprolol succinate  50 mg Oral Daily  .  potassium chloride  20 mEq Oral BID  . sodium chloride  3 mL Intravenous Q12H  . warfarin  5 mg Oral Custom  . Warfarin - Pharmacist Dosing Inpatient   Does not apply q1800  . DISCONTD: warfarin  7.5 mg Oral Custom  . DISCONTD: Warfarin - Physician Dosing Inpatient   Does not apply q1800   Infusions:   PRN: sodium chloride, acetaminophen, nitroGLYCERIN, ondansetron (ZOFRAN) IV, sodium chloride, zolpidem Anti-infectives    None      Assessment: H/o Afib. Chronic Coumadin. Today INR is 2.82 up from 2.47. INR therapeutic (goal INR = 2-3); No bleeding reported, No CBC today. Home regimen was 5mg  daily, except 7.5mg  MWF.   Goal of Therapy:  INR 2-3   Plan:  Continue his home regimen of 5mg  daily, except 7.5mg  MWF.  INR daily.   Arman Filter , [facility] 09/11/2011,1:11 PM

## 2011-09-12 ENCOUNTER — Other Ambulatory Visit: Payer: Self-pay

## 2011-09-12 DIAGNOSIS — I5023 Acute on chronic systolic (congestive) heart failure: Secondary | ICD-10-CM

## 2011-09-12 LAB — BASIC METABOLIC PANEL
CO2: 26 mEq/L (ref 19–32)
Calcium: 9.3 mg/dL (ref 8.4–10.5)
Chloride: 102 mEq/L (ref 96–112)
Creatinine, Ser: 1.53 mg/dL — ABNORMAL HIGH (ref 0.50–1.35)
Glucose, Bld: 99 mg/dL (ref 70–99)

## 2011-09-12 LAB — PROTIME-INR: INR: 2.79 — ABNORMAL HIGH (ref 0.00–1.49)

## 2011-09-12 MED ORDER — POTASSIUM CHLORIDE CRYS ER 20 MEQ PO TBCR: 20.0000 meq | EXTENDED_RELEASE_TABLET | Freq: Two times a day (BID) | ORAL | Status: DC

## 2011-09-12 MED ORDER — FUROSEMIDE 40 MG PO TABS: 40.0000 mg | ORAL_TABLET | Freq: Two times a day (BID) | ORAL | Status: DC

## 2011-09-12 NOTE — Progress Notes (Signed)
D/c instructions given to pt including f/u care and appointments, s/s of when to notify MD, home medications and diet/activity restrictions.  Pt verbalized understanding with all questions answered and acknowledged receipt.  IV and telemetry removed from pt.  Transport pt via wheelchair to ride.

## 2011-09-12 NOTE — Progress Notes (Deleted)
ANTICOAGULATION CONSULT NOTE  Pharmacy Consult for Coumadin Indication: atrial fibrillation  No Known Allergies  Patient Measurements: Height: 5\' 7"  (170.2 cm) Weight: 194 lb 4.8 oz (88.134 kg) (scale a) IBW/kg (Calculated) : 66.1    Vital Signs: Temp: 98 F (36.7 C) (02/28 0546) Temp src: Oral (02/28 0546) BP: 119/62 mmHg (02/28 0546) Pulse Rate: 60  (02/28 0546)  Labs:  Juan Wolf 09/12/11 0545 09/11/11 0522 09/09/11 2052  HGB -- -- 10.5*  HCT -- -- 32.7*  PLT -- -- 165  APTT -- -- 45*  LABPROT 29.9* 30.1* 27.2*  INR 2.79* 2.82* 2.47*  HEPARINUNFRC -- -- --  CREATININE 1.53* 1.64* 1.48*  CKTOTAL -- -- 90  CKMB -- -- 4.3*  TROPONINI -- -- --   Estimated Creatinine Clearance: 36 ml/min (by C-G formula based on Cr of 1.53).  Medical History: Past Medical History  Diagnosis Date  . Other malaise and fatigue   . Shortness of breath   . Umbilical hernia without mention of obstruction or gangrene   . Other and unspecified hyperlipidemia   . COPD (chronic obstructive pulmonary disease)   . Hypertension   . Cramp of limb   . Atrial flutter     s/p RFCA   . Atrial fibrillation     tikosyn rx  . CAD (coronary artery disease)     s/p CABG 1997;  echo 2008: EF 55-60%  . Aortic stenosis 06/05/2011  . Chronic kidney disease     CKD  . Complication of anesthesia     " The doctor told me not to let anyone put me to sleep"  . Myocardial infarction   . GERD (gastroesophageal reflux disease)   . Arthritis     Medications:  Prescriptions prior to admission  Medication Sig Dispense Refill  . aspirin EC 81 MG tablet Take 81 mg by mouth daily.      Marland Kitchen dofetilide (TIKOSYN) 125 MCG capsule Take 250 mcg by mouth 2 (two) times daily.      . furosemide (LASIX) 40 MG tablet Take 40 mg by mouth daily.        Marland Kitchen gabapentin (NEURONTIN) 100 MG capsule Take 200 mg by mouth daily.       . isosorbide mononitrate (IMDUR) 30 MG 24 hr tablet Take 30 mg by mouth daily.        Marland Kitchen lisinopril  (PRINIVIL,ZESTRIL) 5 MG tablet Take 5 mg by mouth 2 (two) times daily.        . metoprolol succinate (TOPROL-XL) 50 MG 24 hr tablet Take 50 mg by mouth daily. Take with or immediately following a meal.      . nitroGLYCERIN (NITROSTAT) 0.4 MG SL tablet Place 0.4 mg under the tongue every 5 (five) minutes as needed. For chest pain       . potassium chloride SA (K-DUR,KLOR-CON) 20 MEQ tablet Take 20 mEq by mouth daily.        Marland Kitchen warfarin (COUMADIN) 5 MG tablet Take 5-7.5 mg by mouth daily. Patient reports: 5mg  daily except 7.5mg  Monday, Wednesday, Friday.       . zolpidem (AMBIEN) 10 MG tablet Take 10 mg by mouth at bedtime as needed. For sleep        Scheduled:     . aspirin EC  81 mg Oral Daily  . dofetilide  250 mcg Oral BID  . furosemide  40 mg Intravenous BID  . gabapentin  200 mg Oral Daily  . lisinopril  5 mg Oral BID  .  metoprolol succinate  50 mg Oral Daily  . potassium chloride  20 mEq Oral BID  . sodium chloride  3 mL Intravenous Q12H  . warfarin  5 mg Oral Custom  . warfarin  7.5 mg Oral Custom  . Warfarin - Pharmacist Dosing Inpatient   Does not apply q1800  . DISCONTD: isosorbide mononitrate  30 mg Oral Daily  . DISCONTD: warfarin  7.5 mg Oral Custom  . DISCONTD: Warfarin - Physician Dosing Inpatient   Does not apply q1800   Infusions:   PRN: sodium chloride, acetaminophen, nitroGLYCERIN, ondansetron (ZOFRAN) IV, sodium chloride, zolpidem Anti-infectives    None      Assessment: 89 yom with h/o afib on chronic coumadin. INR: 2.79, therapeutic (2-3) on PTA regimen of 5 mg daily except 7.5 mg MWF.   No bleeding reported, No CBC today. Doses charted appropriately.   Goal of Therapy:  INR 2-3   Plan:   - Continue his home regimen of 5mg  daily, except 7.5mg  MWF.  - INR daily.  Birdena Crandall, PharmD Candidate 09/12/2011 10:25 AM

## 2011-09-12 NOTE — Discharge Summary (Signed)
Patient ID: Juan Wolf MRN: [id number] DOB/AGE: 01-21-1922 78 y.o.  Admit date: 09/09/2011 Discharge date: 09/12/2011  Primary Discharge Diagnosis 1) Acute on chronic Diastolic Dysfunction 2) Aortic stenosis, moderate      - AVA 0.68 cm^2, Mean gradient: 29mm Hg,  3) History of AF in NSR on Tikosyn and Coumadin 4) CAD      - s/p CABG in 1997     - s/p PCI x2 to saphenous vein graft in 06/2011 5) COPD  Secondary Discharge Diagnosis 1) GERD 2) Hypertension 3) GERD  Significant Diagnostic Studies:  1) Echo: LV: mild LVH. Estimated ejection fraction 55% to 60%.   Aortic valve: moderate stenosis. Mild regurgitation. Valve area: 0.68cm^2(VTI). Valve area: 0.67cm^2 (Vmax). Mitral valve: Mild to moderate regurgitation. Left atrium: moderately dilated. Atrial septum: No defect or patent foramen ovale was identified. Pulmonary arteries: PA peak pressure: 37mm Hg (S). 2) CXR: Cardiomegaly, Suspect chronic, interstitial lung disease.  Hospital Course:  Mr. Novello is a 78 y.o. gentlemen with the above stated problem list who presented with progressive dyspnea on exertion and class III-IIIB symptoms.  He was admitted for volume overload and placed on IV diuresis.  His dyspnea was felt multi-factorial with heart failure, COPD and AS.  An echo showed LVEF 55-60%, moderate AS.  CXR with chronic interstitial lung disease.  ProBNP 2810.  He diuresed 6 pounds.  IV lasix was changed back to oral form.  Dr. Kirke Corin, his cardiologist increased lasix to 40 mg twice daily.  He will be continued on this at home.   The patient's wife passed in August and since that time he has been cooking for himself and going out to eat at [employer].  A dietary consult was obtained for HF diet education.  Patient was receptive to the education.  Daily weights and medication changes were discussed in full, he voiced understanding.   On day of discharge, he was ambulating in the halls without difficulty.  He dyspnea had  improved.  Dr. Gala Romney evaluated the patient and noted him stable for home.     Discharge Info: Blood pressure 119/62, pulse 60, temperature 98 F (36.7 C), temperature source Oral, resp. rate 17, height 5\' 7"  (1.702 m), weight 88.134 kg (194 lb 4.8 oz), SpO2 95.00%.   General: Pleasant elderly male, in no acute distress.  Head: Normocephalic, atraumatic, sclera non-icteric, nares are without discharge.  Neck: Supple. Negative for carotid bruits. JVD to 7-8  Lungs: No wheezes or rhonchi. Breathing is unlabored.  Heart: RRR with extra systoles S1 S2 3/6 SEM RUSB No rubs, or gallops.  Abdomen: Soft, non-tender, non-distended with normoactive bowel sounds. No rebound/guarding. No obvious abdominal masses.  Msk: Strength and tone appear normal for age.  Extremities: No edema. No clubbing or cyanosis. Distal pedal pulses are intact and equal bilaterally.  Neuro: Alert and oriented X 3. Moves all extremities spontaneously.  Psych: Responds to questions appropriately with a normal affect.  Weight change: -0.466 kg (-1 lb 0.4 oz) Results for orders placed during the hospital encounter of 09/09/11 (from the past 24 hour(s))  PROTIME-INR     Status: Abnormal   Collection Time   09/12/11  5:45 AM      Component Value Range   Prothrombin Time 29.9 (*) 11.6 - 15.2 (seconds)   INR 2.79 (*) 0.00 - 1.49   BASIC METABOLIC PANEL     Status: Abnormal   Collection Time   09/12/11  5:45 AM      Component Value  Range   Sodium 137  135 - 145 (mEq/L)   Potassium 3.9  3.5 - 5.1 (mEq/L)   Chloride 102  96 - 112 (mEq/L)   CO2 26  19 - 32 (mEq/L)   Glucose, Bld 99  70 - 99 (mg/dL)   BUN 33 (*) 6 - 23 (mg/dL)   Creatinine, Ser 1.91 (*) 0.50 - 1.35 (mg/dL)   Calcium 9.3  8.4 - 47.8 (mg/dL)   GFR calc non Af Amer 39 (*) >90 (mL/min)   GFR calc Af Amer 45 (*) >90 (mL/min)     Discharge Medications: Medication List  As of 09/12/2011 10:20 AM   STOP taking these medications         isosorbide mononitrate  30 MG 24 hr tablet         TAKE these medications         aspirin EC 81 MG tablet   Take 81 mg by mouth daily.      dofetilide 125 MCG capsule   Commonly known as: TIKOSYN   Take 250 mcg by mouth 2 (two) times daily.      furosemide 40 MG tablet   Commonly known as: LASIX   Take 1 tablet (40 mg total) by mouth 2 (two) times daily.      gabapentin 100 MG capsule   Commonly known as: NEURONTIN   Take 200 mg by mouth daily.      lisinopril 5 MG tablet   Commonly known as: PRINIVIL,ZESTRIL   Take 5 mg by mouth 2 (two) times daily.      metoprolol succinate 50 MG 24 hr tablet   Commonly known as: TOPROL-XL   Take 50 mg by mouth daily. Take with or immediately following a meal.      nitroGLYCERIN 0.4 MG SL tablet   Commonly known as: NITROSTAT   Place 0.4 mg under the tongue every 5 (five) minutes as needed. For chest pain      potassium chloride SA 20 MEQ tablet   Commonly known as: K-DUR,KLOR-CON   Take 1 tablet (20 mEq total) by mouth 2 (two) times daily.      warfarin 5 MG tablet   Commonly known as: COUMADIN   Take 5-7.5 mg by mouth daily. Patient reports: 5mg  daily except 7.5mg  Monday, Wednesday, Friday.      zolpidem 10 MG tablet   Commonly known as: AMBIEN   Take 10 mg by mouth at bedtime as needed. For sleep              Follow-up Plans & Instructions: Discharge Orders    Future Appointments: Provider: Department: Dept Phone: Center:   09/20/2011 2:15 PM Iran Ouch, MD [facility] [phone] [facility]     Future Orders Please Complete By Expires   Diet - low sodium heart healthy      Increase activity slowly      Heart Failure patients record your daily weight using the same scale at the same time of day      STOP any activity that causes chest pain, shortness of breath, dizziness, sweating, or exessive weakness      Beta Blocker already ordered      (HEART FAILURE PATIENTS) Call MD:  Anytime you have any of the following symptoms: 1) 3  pound weight gain in 24 hours or 5 pounds in 1 week 2) shortness of breath, with or without a dry hacking cough 3) swelling in the hands, feet or stomach 4) if you  have to sleep on extra pillows at night in order to breathe.      ACE Inhibitor / ARB already ordered        Follow-up Information    Follow up with [name], MD on 09/20/2011. (at 2:15 p )    Contact information:   [address] [place] [place] [address] [phone]           BRING ALL MEDICATIONS WITH YOU TO FOLLOW UP APPOINTMENTS  Time spent with patient to include physician time:35 minutes Signed:  Robbi Garter, PA 09/12/2011, 10:20 AM   Patient seen and examined with Ulyess Blossom PA-C. We discussed all aspects of the encounter. I agree with the assessment and plan as stated above. Patient much improved after diuresis. Now euvolemic on exam. Ambulating halls. Extensive education provided about need for dietary discretion. Reinforced need for daily weights and reviewed use of sliding scale diuretics. He will f/u with Dr. Kirke Corin.  Truman Hayward 5:48 PM

## 2011-09-20 ENCOUNTER — Ambulatory Visit (INDEPENDENT_AMBULATORY_CARE_PROVIDER_SITE_OTHER): Payer: Medicare Other | Admitting: Cardiovascular Disease

## 2011-09-20 ENCOUNTER — Encounter: Payer: Self-pay | Admitting: Cardiovascular Disease

## 2011-09-20 VITALS — BP 100/52 | HR 59 | Ht 67.0 in | Wt 199.0 lb

## 2011-09-20 DIAGNOSIS — I509 Heart failure, unspecified: Secondary | ICD-10-CM

## 2011-09-20 DIAGNOSIS — I4892 Unspecified atrial flutter: Secondary | ICD-10-CM

## 2011-09-20 DIAGNOSIS — I350 Nonrheumatic aortic (valve) stenosis: Secondary | ICD-10-CM

## 2011-09-20 DIAGNOSIS — I2510 Atherosclerotic heart disease of native coronary artery without angina pectoris: Secondary | ICD-10-CM

## 2011-09-20 DIAGNOSIS — R0602 Shortness of breath: Secondary | ICD-10-CM

## 2011-09-20 DIAGNOSIS — I5032 Chronic diastolic (congestive) heart failure: Secondary | ICD-10-CM

## 2011-09-20 DIAGNOSIS — I359 Nonrheumatic aortic valve disorder, unspecified: Secondary | ICD-10-CM

## 2011-09-20 LAB — PROTIME-INR

## 2011-09-20 NOTE — Assessment & Plan Note (Signed)
The patient had a recent heart failure exacerbation likely due to diastolic dysfunction in the setting of moderate to severe aortic stenosis. He seems to be doing better after increasing his dose of Lasix to 40 mg twice daily. He does have chronic kidney disease. I will check basic metabolic profile and BNP today. He is complaining of increased dyspnea. I will consider increasing the dose of Lasix further.

## 2011-09-20 NOTE — Progress Notes (Signed)
HPI  Juan Wolf is an 78 year old male who is here today for a followup visit. He was hospitalized at [facility] recently due to worsening dyspnea. He was found to have congestive heart failure. He improved with IV Lasix. He had an echocardiogram done which showed normal LV systolic function with evidence of moderate to severe aortic stenosis. Mean aortic valve gradient was 29 mm mercury and aortic valve area was 0.68 cm. His home dose of Lasix was increased to 40 mg twice daily. He felt better after hospital discharge. However, a few days later he started having dyspnea but not to the same degree as he had before his hospitalization. He denies any chest pain or dizziness.  No Known Allergies   Current Outpatient Prescriptions on File Prior to Visit  Medication Sig Dispense Refill  . aspirin EC 81 MG tablet Take 81 mg by mouth daily.      Marland Kitchen dofetilide (TIKOSYN) 125 MCG capsule Take 250 mcg by mouth 2 (two) times daily.      . furosemide (LASIX) 40 MG tablet Take 1 tablet (40 mg total) by mouth 2 (two) times daily.  60 tablet  6  . gabapentin (NEURONTIN) 100 MG capsule Take 200 mg by mouth daily.       . metoprolol succinate (TOPROL-XL) 50 MG 24 hr tablet Take 50 mg by mouth daily. Take with or immediately following a meal.      . nitroGLYCERIN (NITROSTAT) 0.4 MG SL tablet Place 0.4 mg under the tongue every 5 (five) minutes as needed. For chest pain       . potassium chloride SA (K-DUR,KLOR-CON) 20 MEQ tablet Take 1 tablet (20 mEq total) by mouth 2 (two) times daily.  60 tablet  6  . warfarin (COUMADIN) 5 MG tablet Take 5-7.5 mg by mouth daily. Patient reports: 5mg  daily except 7.5mg  Monday, Wednesday, Friday.       . zolpidem (AMBIEN) 10 MG tablet Take 10 mg by mouth at bedtime as needed. For sleep          Past Medical History  Diagnosis Date  . Other malaise and fatigue   . Shortness of breath   . Umbilical hernia without mention of obstruction or gangrene   . Other and  unspecified hyperlipidemia   . COPD (chronic obstructive pulmonary disease)   . Hypertension   . Cramp of limb   . Atrial flutter     s/p RFCA   . Atrial fibrillation     tikosyn rx  . CAD (coronary artery disease)     s/p CABG 1997;  echo 2008: EF 55-60%  . Aortic stenosis 06/05/2011  . Chronic kidney disease     CKD  . Complication of anesthesia     " The doctor told me not to let anyone put me to sleep"  . Myocardial infarction   . GERD (gastroesophageal reflux disease)   . Arthritis   . Chronic diastolic heart failure      Past Surgical History  Procedure Date  . Appendectomy   . Back surgery   . Cholecystectomy   . Hernia repair   . Cardiac catheterization 06/19/2011    Moderate pulmonary hypertension, moderate AS (valve area: 1.05), severe 3 vessel CAD. 90% stensis in both SVG to LAD/diagonal and SVG to RCA. Patent SVG to OM.   Marland Kitchen Coronary angioplasty 06/19/2011    BMS to both SVG to RCA and SVG to LAD/diagonal  . Coronary artery bypass graft  History reviewed. No pertinent family history.   History   Social History  . Marital Status: Married    Spouse Name: N/A    Number of Children: N/A  . Years of Education: N/A   Occupational History  . Not on file.   Social History Main Topics  . Smoking status: Former Smoker -- 0.3 packs/day for 55 years    Types: Cigars    Quit date: 07/16/1995  . Smokeless tobacco: Former Neurosurgeon    Types: Chew    Quit date: 07/15/1944   Comment: chewed tobacoo for 3 years  . Alcohol Use: No  . Drug Use: No  . Sexually Active: No   Other Topics Concern  . Not on file   Social History Narrative  . No narrative on file     PHYSICAL EXAM   BP 100/52  Pulse 59  Ht 5\' 7"  (1.702 m)  Wt 199 lb (90.266 kg)  BMI 31.17 kg/m2  SpO2 95%  Constitutional: He is oriented to person, place, and time. He appears well-developed and well-nourished. No distress.  HENT: No nasal discharge.  Head: Normocephalic and atraumatic.    Eyes: Pupils are equal and round. Right eye exhibits no discharge. Left eye exhibits no discharge.  Neck: Normal range of motion. Neck supple. No JVD present. No thyromegaly present.  Cardiovascular: Normal rate, regular rhythm.. Exam reveals no gallop and no friction rub. Is a 3/6 systolic crescendo murmur at the aortic area with diminished S2 Pulmonary/Chest: Effort normal and breath sounds normal. No stridor. No respiratory distress. He has no wheezes. He has no rales. He exhibits no tenderness.  Abdominal: Soft. Bowel sounds are normal. He exhibits no distension. There is no tenderness. There is no rebound and no guarding.  Musculoskeletal: Normal range of motion. He exhibits trace edema and no tenderness.  Neurological: He is alert and oriented to person, place, and time. Coordination normal.  Skin: Skin is warm and dry. No rash noted. He is not diaphoretic. No erythema. No pallor.  Psychiatric: He has a normal mood and affect. His behavior is normal. Judgment and thought content normal.        ASSESSMENT AND PLAN

## 2011-09-20 NOTE — Assessment & Plan Note (Addendum)
This was moderate by cardiac catheterization late last year with a valve area of 1.1. He had an echocardiogram done during his hospitalization which showed a mean gradient of 29 mmHg and a calculated valve area of 0.68 cm. I think his aortic stenosis is gradually worsening. I do not think his aortic stenosis is severe yet but is close. This is likely contributing to his heart failure as well. His blood pressure is somewhat on the low side today. I will stop lisinopril today as I would like for his blood pressure time a little bit higher in the setting of aortic stenosis. His ejection fraction was normal so the benefits of ACE inhibitors is marginal. I do not think that the patient would be a good candidate for AVR due to his age, previous CABG and other comorbidities. His aortic stenosis should be monitored with an echocardiogram every 6-12 months. The patient seems to be interested in TAVR. He has seen this on TV and he read about it. If he continues to have symptoms of heart failure, we might have to consider referring him to the valve multidisciplinary clinic to see if he would be a candidate for TAVR.

## 2011-09-20 NOTE — Patient Instructions (Addendum)
   Follow up in 3 months.  Stop Lisinopril. Your physician recommends that you go to the [facility] for lab work: BMET/BNP/PT/INR If the results of your test are normal or stable, you will receive a letter. If they are abnormal, the nurse will contact you by phone.

## 2011-09-20 NOTE — Assessment & Plan Note (Signed)
No recurrent angina. Continue medical therapy.  

## 2011-09-20 NOTE — Assessment & Plan Note (Signed)
He is in sinus rhythm with frequent PVCs. Continue dofetilide. Check INR today with his other blood work and forward the results to Dr. Sherril Croon.

## 2011-09-23 ENCOUNTER — Telehealth: Payer: Self-pay | Admitting: *Deleted

## 2011-09-23 DIAGNOSIS — N289 Disorder of kidney and ureter, unspecified: Secondary | ICD-10-CM

## 2011-09-23 NOTE — Telephone Encounter (Signed)
Pt notified of results and verbalized understanding  

## 2011-09-23 NOTE — Telephone Encounter (Signed)
Message copied by Arlyss Gandy on Mon Sep 23, 2011  3:34 PM ------      Message from: [name] A      Created: Mon Sep 23, 2011  2:28 PM       Inform patient that his labs showed that his kidney function was worse. Stay off Lisinopril and repeat BMP in 1 week. We can not increase the dose of Lasix. His INR was perfect.

## 2011-10-01 ENCOUNTER — Ambulatory Visit: Payer: Medicare Other | Admitting: Cardiovascular Disease

## 2011-10-02 ENCOUNTER — Telehealth: Payer: Self-pay | Admitting: *Deleted

## 2011-10-02 DIAGNOSIS — R0602 Shortness of breath: Secondary | ICD-10-CM

## 2011-10-02 NOTE — Telephone Encounter (Signed)
Saw Dr. Kirke Corin abut a week ago.  No weight gain.  Now c/o SOB & weakness with walking/activity.  Been feeling like this again over last few weeks, but seems to be getting worse every day.

## 2011-10-03 NOTE — Telephone Encounter (Signed)
Change Lasix to 40 mg in am and 20 mg in afternoon.

## 2011-10-04 NOTE — Telephone Encounter (Signed)
Stay on 40 mg bid. Check BMP, BNP and chest x ray.

## 2011-10-04 NOTE — Telephone Encounter (Signed)
Pt is already taking Lasix 40 mg two times a day.Juan Wolf

## 2011-10-04 NOTE — Telephone Encounter (Signed)
Pt notified and verbalized understanding.

## 2011-10-09 ENCOUNTER — Telehealth: Payer: Self-pay | Admitting: *Deleted

## 2011-10-09 DIAGNOSIS — I5032 Chronic diastolic (congestive) heart failure: Secondary | ICD-10-CM

## 2011-10-09 DIAGNOSIS — Z79899 Other long term (current) drug therapy: Secondary | ICD-10-CM

## 2011-10-09 DIAGNOSIS — R0602 Shortness of breath: Secondary | ICD-10-CM

## 2011-10-09 MED ORDER — FUROSEMIDE 40 MG PO TABS: 60.0000 mg | ORAL_TABLET | Freq: Two times a day (BID) | ORAL | Status: DC

## 2011-10-09 NOTE — Telephone Encounter (Signed)
Message copied by Arlyss Gandy on Wed Oct 09, 2011 10:17 AM ------      Message from: [name] A      Created: Tue Oct 08, 2011 10:06 PM       His labs and x ray showed congestive heart failure. Increase Lasix to 60 mg bid. Recheck BMP in 1 week.

## 2011-10-09 NOTE — Telephone Encounter (Signed)
Pt notified of results and verbalized understanding  

## 2011-10-17 ENCOUNTER — Telehealth: Payer: Self-pay | Admitting: *Deleted

## 2011-10-17 NOTE — Telephone Encounter (Signed)
Message copied by Arlyss Gandy on Thu Oct 17, 2011  3:49 PM ------      Message from: [name] A      Created: Thu Oct 17, 2011 12:05 PM       Inform patient that his renal function is stable.  Is he feeling better?

## 2011-10-17 NOTE — Telephone Encounter (Signed)
Pt notified of lab results and verbalized understanding.   He states he is feeling about the same. He states when he goes out to get his paper he gets very short-winded.

## 2011-10-18 NOTE — Telephone Encounter (Signed)
Continue same dose of Lasix 60 mg bid. He needs to follow up in 1-2 months. If there is no improvement, we might need to do a heart cath to check the stents.

## 2011-10-18 NOTE — Telephone Encounter (Signed)
Pt notified per Dr. Jari Sportsman response. Pt states he has to get up several times during the night to use the restroom. Pt has been taking Lasix at 8pm. RN instructed pt to take Lasix around 4-5 pm to prevent having to get up frequently during the night. Pt verbalized understanding. Pt will contact the office if worsening symptoms before 12/31/11 OV.

## 2011-12-12 ENCOUNTER — Inpatient Hospital Stay (HOSPITAL_COMMUNITY)
Admission: EM | Admit: 2011-12-12 | Discharge: 2011-12-21 | DRG: 248 | Disposition: A | Discharge status: Home / Self Care | Payer: Medicare Other | Source: Other Acute Inpatient Hospital | Attending: Cardiology | Admitting: Cardiology

## 2011-12-12 ENCOUNTER — Other Ambulatory Visit: Payer: Self-pay | Admitting: Physician Assistant

## 2011-12-12 ENCOUNTER — Encounter (HOSPITAL_COMMUNITY): Payer: Self-pay | Admitting: *Deleted

## 2011-12-12 ENCOUNTER — Encounter: Payer: Self-pay | Admitting: Physician Assistant

## 2011-12-12 DIAGNOSIS — I25810 Atherosclerosis of coronary artery bypass graft(s) without angina pectoris: Secondary | ICD-10-CM | POA: Diagnosis present

## 2011-12-12 DIAGNOSIS — I509 Heart failure, unspecified: Secondary | ICD-10-CM

## 2011-12-12 DIAGNOSIS — J449 Chronic obstructive pulmonary disease, unspecified: Secondary | ICD-10-CM | POA: Diagnosis present

## 2011-12-12 DIAGNOSIS — D649 Anemia, unspecified: Secondary | ICD-10-CM | POA: Diagnosis present

## 2011-12-12 DIAGNOSIS — I4891 Unspecified atrial fibrillation: Secondary | ICD-10-CM | POA: Diagnosis present

## 2011-12-12 DIAGNOSIS — I4892 Unspecified atrial flutter: Secondary | ICD-10-CM

## 2011-12-12 DIAGNOSIS — I4729 Other ventricular tachycardia: Principal | ICD-10-CM | POA: Diagnosis present

## 2011-12-12 DIAGNOSIS — Z7901 Long term (current) use of anticoagulants: Secondary | ICD-10-CM

## 2011-12-12 DIAGNOSIS — K429 Umbilical hernia without obstruction or gangrene: Secondary | ICD-10-CM | POA: Diagnosis present

## 2011-12-12 DIAGNOSIS — J4489 Other specified chronic obstructive pulmonary disease: Secondary | ICD-10-CM | POA: Diagnosis present

## 2011-12-12 DIAGNOSIS — I5033 Acute on chronic diastolic (congestive) heart failure: Secondary | ICD-10-CM | POA: Diagnosis present

## 2011-12-12 DIAGNOSIS — R5381 Other malaise: Secondary | ICD-10-CM | POA: Diagnosis present

## 2011-12-12 DIAGNOSIS — N289 Disorder of kidney and ureter, unspecified: Secondary | ICD-10-CM | POA: Diagnosis present

## 2011-12-12 DIAGNOSIS — I472 Ventricular tachycardia, unspecified: Principal | ICD-10-CM | POA: Diagnosis present

## 2011-12-12 DIAGNOSIS — I2510 Atherosclerotic heart disease of native coronary artery without angina pectoris: Secondary | ICD-10-CM | POA: Diagnosis present

## 2011-12-12 DIAGNOSIS — Z7982 Long term (current) use of aspirin: Secondary | ICD-10-CM

## 2011-12-12 DIAGNOSIS — I359 Nonrheumatic aortic valve disorder, unspecified: Secondary | ICD-10-CM

## 2011-12-12 DIAGNOSIS — E785 Hyperlipidemia, unspecified: Secondary | ICD-10-CM | POA: Diagnosis present

## 2011-12-12 DIAGNOSIS — N170 Acute kidney failure with tubular necrosis: Secondary | ICD-10-CM | POA: Diagnosis not present

## 2011-12-12 DIAGNOSIS — I080 Rheumatic disorders of both mitral and aortic valves: Secondary | ICD-10-CM | POA: Diagnosis present

## 2011-12-12 DIAGNOSIS — Z79899 Other long term (current) drug therapy: Secondary | ICD-10-CM

## 2011-12-12 DIAGNOSIS — Z9861 Coronary angioplasty status: Secondary | ICD-10-CM

## 2011-12-12 DIAGNOSIS — I469 Cardiac arrest, cause unspecified: Secondary | ICD-10-CM | POA: Diagnosis present

## 2011-12-12 DIAGNOSIS — K219 Gastro-esophageal reflux disease without esophagitis: Secondary | ICD-10-CM | POA: Diagnosis present

## 2011-12-12 DIAGNOSIS — I129 Hypertensive chronic kidney disease with stage 1 through stage 4 chronic kidney disease, or unspecified chronic kidney disease: Secondary | ICD-10-CM | POA: Diagnosis present

## 2011-12-12 DIAGNOSIS — E875 Hyperkalemia: Secondary | ICD-10-CM | POA: Diagnosis present

## 2011-12-12 DIAGNOSIS — E876 Hypokalemia: Secondary | ICD-10-CM | POA: Diagnosis not present

## 2011-12-12 DIAGNOSIS — I350 Nonrheumatic aortic (valve) stenosis: Secondary | ICD-10-CM | POA: Diagnosis present

## 2011-12-12 DIAGNOSIS — N189 Chronic kidney disease, unspecified: Secondary | ICD-10-CM | POA: Diagnosis present

## 2011-12-12 DIAGNOSIS — Z7902 Long term (current) use of antithrombotics/antiplatelets: Secondary | ICD-10-CM

## 2011-12-12 HISTORY — DX: Ventricular tachycardia, unspecified: I47.20

## 2011-12-12 HISTORY — DX: Ventricular tachycardia: I47.2

## 2011-12-12 HISTORY — DX: Pulmonary hypertension, unspecified: I27.20

## 2011-12-12 LAB — MRSA PCR SCREENING: MRSA by PCR: NEGATIVE

## 2011-12-12 LAB — PROTIME-INR
INR: 2.49 — ABNORMAL HIGH (ref 0.00–1.49)
Prothrombin Time: 27.3 seconds — ABNORMAL HIGH (ref 11.6–15.2)

## 2011-12-12 MED ORDER — SODIUM CHLORIDE 0.9 % IJ SOLN
3.0000 mL | INTRAMUSCULAR | Status: DC | PRN
  Administered 2011-12-14: 3 mL via INTRAVENOUS

## 2011-12-12 MED ORDER — LIDOCAINE IN D5W 4-5 MG/ML-% IV SOLN
2.0000 mg/min | INTRAVENOUS | Status: DC
  Administered 2011-12-12: 2 mg/min via INTRAVENOUS
  Filled 2011-12-12: qty 250

## 2011-12-12 MED ORDER — DOFETILIDE 125 MCG PO CAPS
125.0000 ug | ORAL_CAPSULE | Freq: Two times a day (BID) | ORAL | Status: DC
Start: 2011-12-12 — End: 2011-12-12
  Filled 2011-12-12: qty 1

## 2011-12-12 MED ORDER — NITROGLYCERIN 0.4 MG SL SUBL: 0.4000 mg | SUBLINGUAL_TABLET | SUBLINGUAL | Status: DC | PRN

## 2011-12-12 MED ORDER — SODIUM CHLORIDE 0.9 % IJ SOLN
3.0000 mL | Freq: Two times a day (BID) | INTRAMUSCULAR | Status: DC
  Administered 2011-12-13 – 2011-12-21 (×13): 3 mL via INTRAVENOUS

## 2011-12-12 MED ORDER — SODIUM CHLORIDE 0.9 % IV SOLN
250.0000 mL | INTRAVENOUS | Status: DC | PRN
  Administered 2011-12-12: 10 mL via INTRAVENOUS

## 2011-12-12 MED ORDER — ONDANSETRON HCL 4 MG/2ML IJ SOLN
4.0000 mg | Freq: Four times a day (QID) | INTRAMUSCULAR | Status: DC | PRN
  Administered 2011-12-12 – 2011-12-13 (×3): 4 mg via INTRAVENOUS
  Filled 2011-12-12 (×3): qty 2

## 2011-12-12 MED ORDER — ACETAMINOPHEN 325 MG PO TABS
650.0000 mg | ORAL_TABLET | ORAL | Status: DC | PRN
  Administered 2011-12-13 – 2011-12-17 (×3): 650 mg via ORAL
  Filled 2011-12-12 (×3): qty 2

## 2011-12-12 MED ORDER — LIDOCAINE IN D5W 4-5 MG/ML-% IV SOLN
2.0000 mg/min | INTRAVENOUS | Status: DC
  Filled 2011-12-12: qty 500

## 2011-12-12 MED ORDER — ASPIRIN EC 81 MG PO TBEC
81.0000 mg | DELAYED_RELEASE_TABLET | Freq: Every day | ORAL | Status: DC
  Administered 2011-12-13 – 2011-12-17 (×5): 81 mg via ORAL
  Filled 2011-12-12 (×6): qty 1

## 2011-12-12 MED ORDER — POTASSIUM CHLORIDE CRYS ER 20 MEQ PO TBCR
20.0000 meq | EXTENDED_RELEASE_TABLET | Freq: Two times a day (BID) | ORAL | Status: DC
  Administered 2011-12-12: 20 meq via ORAL
  Filled 2011-12-12 (×3): qty 1

## 2011-12-12 MED ORDER — LIDOCAINE IN D5W 4-5 MG/ML-% IV SOLN
2.0000 mg/min | INTRAVENOUS | Status: DC
  Administered 2011-12-13: 2 mg/min via INTRAVENOUS
  Filled 2011-12-12: qty 500

## 2011-12-12 NOTE — H&P (Signed)
NAME:  Juan Wolf, Juan Wolf ROOM: 250  UNIT NUMBER:  [id number] LOCATION: [facility] 250 01 ADM/VISIT DATE:  12/11/2011   ADM Vaughan BrownerKathaleen Grinder:  [id number] DOB: February 26, 1922   PRIMARY CARDIOLOGIST:  Lewayne Bunting, M.D.  REFERRING PHYSICIAN:  Doreen Beam, M.D.  REASON FOR CONSULTATION:  Congestive heart failure.  HISTORY OF PRESENT ILLNESS:  Mr. Hasz is an 78 year old male, with history of multivessel coronary artery disease, status post remote CABG in 1997, who more recently underwent bare metal stenting of the SVG-RCA and SVG-LAD-diagonal grafts in 06/2011.  He has history of normal left ventricular function and moderately severe aortic stenosis, by most recent assessment, with mean gradient 29 mmHg/AVA 0.68 cm squared, during hospitalization for CHF exacerbation at [facility], early this year.  He was seen in clinic for post hospital followup on 09/20/2011, by Dr. Kirke Corin, at which time he felt that the patient's aortic stenosis had gradually worsened, but was not yet severe.  He also felt that this was most likely contributing to his heart failure.  He discontinued lisinopril, given that blood pressure was low-normal, and so as to provide a higher margin blood pressure in the setting of significant aortic stenosis, and normal left ventricular function.  With regard to treatment for the AS, he felt that the patient would not be a good candidate for valvular surgery, citing age, previous CABG, and other comorbidities.  However, he noted that the patient seemed to be interested in TAVR, and that this option should be considered were the patient to have recurrent heart failure symptoms.  The patient was admitted directly from the office yesterday, for further evaluation and management of progressive shortness of breath.  He is now referred to Dr. Andee Lineman for assistance in management.  Clinically, the patient denies any recent development of chest pain, but seems to suggest that his symptoms are all related to  exertion.  He suggests that this has slowly worsened over the past few months.  He also denied any symptoms suggestive of orthopnea, PND, significant lower extremity edema or tachypalpitations.  Admission EKG indicated sinus arrhythmia at 69 BPM with QTc 0.495 seconds.  Initial cardiac markers are negative, with peak troponin 0.05.  BNP level was 1100 on admission, with question of chronic interstitial lung disease on chest x-ray.  The patient was sent for a chest CT scan with contrast for further evaluation of the suspected interstitial lung disease.  The study was completed; however, shortly afterwards, he became syncopal while seated, and as reported by staff.  A code blue was called, and the patient did require CPR, but no medical intervention.  He was transferred directly to the intensive care unit where then demonstrated multiple episodes of ventricular tachycardia, some suggestive of torsade de pointes.  The patient remained hemodynamically stable, and was initially placed on intravenous amiodarone.  This was then switched to intravenous lidocaine, per Dr. Margarita Mail request, given that he felt that this was most likely ischemic-mediated ventricular tachycardia, secondary to underlying severe aortic stenosis.  Stat labs revealed normal electrolytes with potassium 3.9 and magnesium 2.6.  Additional cardiac markers remained negative, with a troponin of 0.04.  A followup EKG indicated normal sinus rhythm with no acute ischemic changes.  Of note, QTc was now 0.500 seconds, compared to yesterday's reading of 0.495 seconds.  A 2-D echo has been ordered, and is currently awaiting review by Dr. Andee Lineman.  ALLERGIES:  No known drug allergies.  HOME MEDICATIONS: 1. Aspirin 81 mg daily. 2. Coumadin 5 mg  daily except 7.5 mg Monday, Wednesday, Friday. 3. Lasix 60 mg b.i.d. 4. Metoprolol 50 mg daily. 5. Neurontin 200 mg daily. 6. Potassium 20 mEq b.i.d. 7. Tikosyn 0.25 mg b.i.d. 8. Zolpidem 10 mg at bedtime  p.r.n. 9. Nitrostat 0.4 mg p.r.n.  PAST MEDICAL HISTORY: 1. Multivessel coronary artery disease. a. Bare metal stenting, SVG-RCA and SVG-LAD/diagonal graft, 06/2011. b. Residual patent SVG-OM graft. c. Status post 4-vessel CABG, 1997. d. Status post balloon angioplasty, 1987. 2. Aortic stenosis. a. Mean gradient 29 mmHg, AVA 0.68 cm squared by VTI. 3. Atrial dysrhythmia. a. Status post RF ablation of atrial flutter, 2008. b. History of atrial fibrillation treated with Tikosyn. 4. Chronic Coumadin anticoagulation. 5. Pulmonary hypertension. a. Moderate (RVSP 50 - 55 mmHg) by 2-D echo, 05/2011. 6. COPD. 7. Chronic kidney disease. a. Baseline creatinine 1.5 - 2.0. 8. HTN. 9. Complication of anesthesia. a. "Doctor told me not to let anyone put me to sleep." 10. GERD. 11. Arthritis. 12. Chronic diastolic heart failure. 13. Umbilical hernia. 14. HLD.  SURGICAL HISTORY: 1. Appendectomy. 2. Cholecystectomy. 3. Hernia repair. 4. Back surgery.  SOCIAL HISTORY:  The patient lives here in [place], alone.  His wife passed away in 04-09-2011.  He quit smoking in 1997.  He denies dietary or sodium indiscretion.  He ambulates freely without walker or cane.  FAMILY HISTORY:  Noncontributory for premature coronary artery disease.  REVIEW OF SYSTEMS:  Negative for exertional angina, orthopnea, PND, or significant lower extremity edema.  Occasional palpitations.  Denies evidence of overt bleeding.  The remaining systems reviewed are negative.  PHYSICAL EXAMINATION:  Vital signs:  Blood pressure currently 111/64, pulse 90, regular, respirations 22, temperature afebrile, saturations 97% on room air, weight 194 pounds.  General:  An 78 year old male sitting upright in no distress.  HEENT:  Normocephalic, atraumatic.  PERRLA.  EOMI.  Neck:  Palpable bilateral carotid pulses without bruits; no obvious JVD at 90 degrees.  Lungs:  Mild left base crackles, no wheezes.  Heart:  Regular rate and rhythm.  A  grade 2 - 3/6 crescendo-decrescendo murmur.  Preserved S2.  No diastolic flow.  Abdomen:  Soft, intact bowel sounds.  Moderately large umbilical hernia.  Extremities:  No significant peripheral edema.  Skin:  Warm and dry.  Musculoskeletal:  No obvious deformity.  Neurologic:  Alert and oriented.  IMAGING: 1. Admission chest x-ray:  Question chronic interstitial lung disease; stable compared to 2012. 2. Admission EKG:  Sinus arrhythmia, 69 BPM; QTc 0.495 seconds.  LABORATORY DATA:  Normal CPK/MB (4); troponin I 0.05 and 0.04 (3), INR 2.1 on admission.  BNP 1100 on admission.  Elevated total bilirubin of 1.3, otherwise normal LFTs.  Sodium currently 139, potassium 3.9, BUN 34, creatinine 1.7 (GFR 38), and glucose 122.  PH 7.44, pCO2 38, pO2 321, bicarb 25.8 (100% oxygen).  WBC 6900, hemoglobin 10.1, hematocrit 32.4 (MCV 90), and platelet 128,000, on admission.  IMPRESSION: 1. Pulseless ventricular tachycardia. a. Requiring cardiopulmonary resuscitation. b. Associated syncope. c. Placed on intravenous lidocaine infusion. 2. Aortic stenosis, moderately severe. 3. Normal left ventricular function. 4. Acute/chronic diastolic heart failure. 5. Multivessel coronary artery disease. a. Negative cardiac markers. b. Status post DES - S-RCA and S-LAD DX grafts, 06/2011. c. Four-vessel coronary artery bypass graft, 1997. 6. Chronic kidney disease. 7. History of atrial arrhythmia. a. Chronic Coumadin anticoagulation.  PLAN:  We will arrange direct transfer of the patient to [facility] later today, to be monitored closely in the intensive care unit, under the  care of Dr. Andee Lineman this weekend.  He will need an evaluation by Dr. Tonny Bollman as to whether or not he is a suitable candidate for TAVR.  In the meanwhile, he will be continued on intravenous lidocaine for suppression of recurrent ventricular tachycardia, which was felt to be ischemia-mediated secondary to severe aortic stenosis.  The  patient will also receive a 2-gram bolus of intravenous magnesium prior to transfer.  Coumadin will be placed on hold, and we will transition to intravenous heparin, once INR is less than or equal to 2.0.  Shortly following initiation of IV amiodarone bolus, the patient did develop bradycardia in the 40 BPM range.  Consequently, Tikosyn will be decreased to 0.125 mcg b.i.d.  Additionally, metoprolol will be placed on hold, and we will continue low-dose aspirin and supplemental potassium.  A 2-D echocardiogram will be reviewed, by Dr. Andee Lineman, prior to transfer, for reassessment of severity of the aortic stenosis.  Please refer to Dr. Margarita Mail addenda note for complete details.  Gene Hawley Pavia, PA, dictating for Lewayne Bunting, M.D.   __________________________    Rozell Searing, P.A. Alinda Money D: 12/12/2011 1336 T: 12/12/2011 1441 P: SER2  The patient has severe aortic stenosis. He had a pulseless arrest earlier today when he came out of the CT scan her. The patient was sitting at the edge of the stretcher when he suddenly felt unwell and passed out. CODE BLUE was called and the patient was found to be in pulseless ventricular tachycardia. Review of the rhythm strips demonstrate possibly pause dependent polymorphic ventricular tachycardia. There was no evidence of monomorphic ventricular tachycardia. I suspect also that it's possible that the patient became ischemic and his blood pressure dropped upon sitting upright in a setting of critical aortic stenosis. The patient received CPR but no drugs were given and eventually developed a sinus bradycardia with pulse. Initially amiodarone was started but due to persistent bradycardia and a suspicion that this was ischemic ventricular tachycardia we switched the patient to intravenous lidocaine. Of note is also that his QTC was prolonged at proximal with 500 ms. We adjusted his dofetilide 125 mcg p.o. B.i.d. I spoke with the patient's niece was actually a radiology  technician and explained that the patient was in critical condition. Oxygenation was good on 100% face mask and the patient did not require intubation. We obtained a stat echocardiogram which showed that the patient actually has relatively preserved LV function but severe aortic stenosis possibly critical. By planimetry aortic valve area is 0.45 cm by the continuity equation it is 0.7 cm. The patient has known coronary artery disease and underwent prior stenting of 2 grafts in December of 2012. When I was at the bedside the patient was awake alert and fully oriented and there were no neurological sequelae. I discussed with the patient and his niece that he would benefit from a transfer to [facility] for close monitoring but more importantly to obtain an EP consultation to address his ventricular tachycardia as well as an evaluation by Dr. Excell Seltzer of his severe aortic stenosis. I told the patient that we could consider balloon valvuloplasty of the aortic valve to stabilize him. We will get an opinion from the EP service as to whether these arrhythmias were secondary to his critical aortic stenosis and can provide [place] with additional recommendations regarding the need for an ICD in the future. However as explained above I suspect that the patient had ischemic ventricular tachycardia and that his primary pathology is critical aortic stenosis.  There was no evidence of monomorphic ventricular tachycardia which I would not suspect with a normal left ventricle.  Alvin Critchley Gent,MD 12/12/2011 5:30PM

## 2011-12-12 NOTE — Progress Notes (Signed)
Greeted patient and family in ICU since he was transferred from [facility]. Spoke with pharmacy earlier and gave order for discontinuation of Tikosyn given VT. See note/orders from [facility] team earlier. Kensleigh Gates PA-C

## 2011-12-12 NOTE — Progress Notes (Addendum)
ANTICOAGULATION CONSULT NOTE - Initial Consult  Pharmacy Consult for Heparin Indication: chest pain/ACS  No Known Allergies  Patient Measurements: Height: 5\' 7"  (170.2 cm) Weight: 194 lb 7.1 oz (88.2 kg) IBW/kg (Calculated) : 66.1  Heparin Dosing Weight: 85 kg  Vital Signs: BP: 126/59 mmHg (05/30 1831)  Labs:  Basename 12/12/11 2022  HGB --  HCT --  PLT --  APTT --  LABPROT 27.3*  INR 2.49*  HEPARINUNFRC --  CREATININE --  CKTOTAL --  CKMB --  TROPONINI --    Estimated Creatinine Clearance: 36 ml/min (by C-G formula based on Cr of 1.53).   Medical History: Past Medical History  Diagnosis Date  . Other malaise and fatigue   . Shortness of breath   . Umbilical hernia without mention of obstruction or gangrene   . Other and unspecified hyperlipidemia   . COPD (chronic obstructive pulmonary disease)   . Hypertension   . Cramp of limb   . Atrial flutter     s/p RFCA   . Atrial fibrillation     tikosyn rx  . CAD (coronary artery disease)     s/p CABG 1997;  echo 2008: EF 55-60%  . Aortic stenosis 06/05/2011  . Chronic kidney disease     CKD  . Complication of anesthesia     " The doctor told me not to let anyone put me to sleep"  . Myocardial infarction   . GERD (gastroesophageal reflux disease)   . Arthritis   . Chronic diastolic heart failure     Medications:  Prescriptions prior to admission  Medication Sig Dispense Refill  . aspirin EC 81 MG tablet Take 81 mg by mouth daily.      Marland Kitchen dofetilide (TIKOSYN) 125 MCG capsule Take 250 mcg by mouth 2 (two) times daily.      . fluticasone (FLONASE) 50 MCG/ACT nasal spray Place 2 sprays into the nose daily as needed. For allergies      . furosemide (LASIX) 40 MG tablet Take 60 mg by mouth 2 (two) times daily.      Marland Kitchen gabapentin (NEURONTIN) 100 MG capsule Take 200 mg by mouth daily.       . metoprolol succinate (TOPROL-XL) 50 MG 24 hr tablet Take 50 mg by mouth daily. Take with or immediately following a meal.       . nitroGLYCERIN (NITROSTAT) 0.4 MG SL tablet Place 0.4 mg under the tongue every 5 (five) minutes as needed. For chest pain      . potassium chloride SA (K-DUR,KLOR-CON) 20 MEQ tablet Take 20 mEq by mouth 2 (two) times daily.      Marland Kitchen warfarin (COUMADIN) 5 MG tablet Take 5-7.5 mg by mouth daily. Take 7.5mg  Monday, Wednesday, Friday. Take 5mg  all other days      . zolpidem (AMBIEN) 10 MG tablet Take 10 mg by mouth at bedtime as needed. For sleep       . DISCONTD: furosemide (LASIX) 40 MG tablet Take 1.5 tablets (60 mg total) by mouth 2 (two) times daily.  90 tablet  6  . DISCONTD: potassium chloride SA (K-DUR,KLOR-CON) 20 MEQ tablet Take 1 tablet (20 mEq total) by mouth 2 (two) times daily.  60 tablet  6  . dofetilide (TIKOSYN) 250 MCG capsule Take 1 capsule (250 mcg total) by mouth every 12 (twelve) hours.  60 capsule  1   Admit Complaint: 78 y.o.  male  admitted 12/12/2011 from [place] after code blue with VT arrest and torsades (  on Tikosyn PTA). Pharmacy consulted to dose heparin  Assessment: Anticoagulation: ACS  Cardiovascular: CAD, severe aortic stenosis, afib (ablation in 2008), pulmonary HTN, HTN, diastolic HF, hyperlipidemia: ASA,  , spoke with Bernette Mayers got order to stop tikosyn given VT episode today, lidocaine ggt, KCL 20 daily PTA Medication Issues: Home Meds Not Ordered: warfarin, gabapentin, metoprolol, fluticosone, lasix Best Practices: DVT Prophylaxis:  Full dose heparin  Goal of Therapy:  Heparin level 0.3-0.7 units/ml Monitor platelets by anticoagulation protocol: Yes   Plan:  1. Follow up INR, last INR at [facility] was 5/29 and it was 2. DC Tikosyn. . Thank you for allowing pharmacy to be a part of this patients care team.  Lovenia Kim Pharm.D., BCPS Clinical Pharmacist 12/12/2011 7:27 PM Pager: [phone] Phone: [phone]   9:09 PM INR result returned at 2.49, will follow up INR in the morning and evaluate for initiation of heparin.  Lovenia Kim  Pharm.D., BCPS Clinical Pharmacist 12/12/2011 9:10 PM Pager: [phone] Phone: [phone]

## 2011-12-13 ENCOUNTER — Encounter (HOSPITAL_COMMUNITY): Payer: Self-pay | Admitting: *Deleted

## 2011-12-13 DIAGNOSIS — I472 Ventricular tachycardia: Principal | ICD-10-CM

## 2011-12-13 DIAGNOSIS — I359 Nonrheumatic aortic valve disorder, unspecified: Secondary | ICD-10-CM

## 2011-12-13 LAB — BASIC METABOLIC PANEL
BUN: 44 mg/dL — ABNORMAL HIGH (ref 6–23)
BUN: 50 mg/dL — ABNORMAL HIGH (ref 6–23)
CO2: 23 mEq/L (ref 19–32)
Calcium: 8.8 mg/dL (ref 8.4–10.5)
Chloride: 98 mEq/L (ref 96–112)
Creatinine, Ser: 2.13 mg/dL — ABNORMAL HIGH (ref 0.50–1.35)
GFR calc non Af Amer: 25 mL/min — ABNORMAL LOW (ref >90)
Glucose, Bld: 140 mg/dL — ABNORMAL HIGH (ref 70–99)
Potassium: 5.3 mEq/L — ABNORMAL HIGH (ref 3.5–5.1)

## 2011-12-13 LAB — PROTIME-INR: Prothrombin Time: 31.8 seconds — ABNORMAL HIGH (ref 11.6–15.2)

## 2011-12-13 MED ORDER — SODIUM CHLORIDE 0.9 % IV SOLN: INTRAVENOUS | Status: AC

## 2011-12-13 MED ORDER — PHYTONADIONE 5 MG PO TABS
2.5000 mg | ORAL_TABLET | Freq: Once | ORAL | Status: AC
  Administered 2011-12-13: 2.5 mg via ORAL
  Filled 2011-12-13 (×2): qty 1

## 2011-12-13 MED ORDER — SODIUM POLYSTYRENE SULFONATE 15 GM/60ML PO SUSP
15.0000 g | Freq: Once | ORAL | Status: AC
  Administered 2011-12-13: 15 g via ORAL
  Filled 2011-12-13: qty 60

## 2011-12-13 NOTE — Clinical Social Work Psychosocial (Signed)
     Clinical Social Work Department BRIEF PSYCHOSOCIAL ASSESSMENT 12/13/2011  Patient:  Juan Wolf, Juan Wolf     Account Number:  [id number]     Admit date:  12/12/2011  Clinical Social Worker:  Hulan Fray  Date/Time:  12/13/2011 02:26 PM  Referred by:  Care Management  Date Referred:  12/13/2011 Referred for  Other - See comment   Other Referral:   advance directive to be notarized   Interview type:  Other - See comment Other interview type:   patient and family    PSYCHOSOCIAL DATA Living Status:  ALONE Admitted from facility:   Level of care:   Primary support name:  Juan Wolf Primary support relationship to patient:  CHILD, ADULT Degree of support available:   supportive    CURRENT CONCERNS Current Concerns  Other - See comment   Other Concerns:   notarize advance directive    SOCIAL WORK ASSESSMENT / PLAN Clinical Social Workere received referral for patient's advance directive to be notarized. Patient had a healthcare POA documents that needed notarization. CSW found two witnessess and Juan Wolf, in Social Work Department notarized the form. CSW made three copies for family. CSW put a copy in patient's shadow chart. There were no other concerns stated, so CSW will sign off as social work intervention is no longer needed.   Assessment/plan status:  No Further Intervention Required Other assessment/ plan:   Information/referral to community resources:   None needed    PATIENTS/FAMILYS RESPONSE TO PLAN OF CARE: Patient and family were appreciative of CSW's assistance with notarization of document.

## 2011-12-13 NOTE — Progress Notes (Signed)
  Echocardiogram 2D Echocardiogram has been performed.  Jorje Guild [facility] 12/13/2011, 10:28 AM

## 2011-12-13 NOTE — Progress Notes (Signed)
    Subjective:  Nausea/vomiting overnight. No chest pain or dyspnea at rest. Pt was living independently prior to hospitalization. Limited by progressive dyspnea. Extensive records from [place] reviewed.  Objective:  Vital Signs in the last 24 hours: Temp:  [96.1 F (35.6 C)-98.2 F (36.8 C)] 97.5 F (36.4 C) (05/31 0330) Pulse Rate:  [46-53] 50  (05/31 0600) Resp:  [10-21] 14  (05/31 0600) BP: (97-130)/(39-85) 120/48 mmHg (05/31 0600) SpO2:  [97 %-100 %] 98 % (05/31 0600) Weight:  [88.2 kg (194 lb 7.1 oz)-90 kg (198 lb 6.6 oz)] 89 kg (196 lb 3.4 oz) (05/31 0500)  Intake/Output from previous day: 05/30 0701 - 05/31 0700 In: 840 [P.O.:240; I.V.:600] Out: 375 [Urine:375]  Physical Exam: Pt is alert and oriented, elderly male in NAD HEENT: normal Neck: JVP - normal, carotids 2+=  Chest: equal expansion Lungs: diffuse rales throughout, expiratory rhonchi present CV: brady and regular with 2/6 harsh systolic murmur LSB Abd: soft, NT, Positive BS, no hepatomegaly Ext: no C/C/E, distal pulses intact and equal Skin: warm/dry no rash Neuro: strength 5/5 and = bilaterally  Lab Results: No results found for this basename: WBC:2,HGB:2,PLT:2 in the last 72 hours  Basename 12/13/11 0510  NA 134*  K 6.0*  CL 98  CO2 23  GLUCOSE 154*  BUN 44*  CREATININE 2.13*   No results found for this basename: TROPONINI:2,CK,MB:2 in the last 72 hours  Tele: sinus brady, no VT  Assessment/Plan:  1. Acute on chronic diastolic CHF secondary to LVH/diastolic dysfunction/aortic stenosis 2. VT arrest - now stabilized. Complex situation in patient with structural heart disease/CAD s/p CABG/severe AS. Suspect ischemic mediated. QTc on this am's EKG is 449. 3. Severe AS - repeat echo this am. If patient stabilizes will consider balloon aortic valvuloplasty next week. Echo to assess aortic annulus size. Pt clearly not a candidate for surgical treatment of AS. 4. Hyperkalemia - renal insufficiency,  has received KCL supplementation. Will give one dose of Kayexalate this am and repeat bmet this afternoon. 5. Acute on chronic renal insufficiency. Low urine output. Will gently give some fluid this am with caution in this patient with significant heart failure. 6. PAF on chronic anticoagulation. INR 3.0 this am. Hold warfarin and give low-dose of Vit K 2 mg orally today for tentative cath/valvuloplasty early next week. 7. Dispo - discussion with family. They understand this patient is critically-ill. Will keep them updated on his progress over the next 24-48 hours. Dr Andee Lineman to round on him over the weekend.  Tonny Bollman, M.D. 12/13/2011, 7:29 AM

## 2011-12-13 NOTE — Progress Notes (Signed)
Patient has not voided since prior to admission. Pt attempted to void with no result; he reports that he does not have the urge to urinate. Bladder scan revealed 0 ml. MD made aware and order received to in and out cath. In and out cath returned 375 ml of clear, amber urine.

## 2011-12-14 ENCOUNTER — Encounter (HOSPITAL_COMMUNITY): Payer: Self-pay | Admitting: *Deleted

## 2011-12-14 LAB — BASIC METABOLIC PANEL
BUN: 53 mg/dL — ABNORMAL HIGH (ref 6–23)
Chloride: 99 mEq/L (ref 96–112)
Creatinine, Ser: 2.09 mg/dL — ABNORMAL HIGH (ref 0.50–1.35)
GFR calc Af Amer: 31 mL/min — ABNORMAL LOW (ref >90)
Glucose, Bld: 79 mg/dL (ref 70–99)

## 2011-12-14 LAB — CBC
HCT: 31.4 % — ABNORMAL LOW (ref 39.0–52.0)
MCH: 28.1 pg (ref 26.0–34.0)
MCHC: 31.2 g/dL (ref 30.0–36.0)
RDW: 16.9 % — ABNORMAL HIGH (ref 11.5–15.5)

## 2011-12-14 LAB — PROTIME-INR: INR: 2.45 — ABNORMAL HIGH (ref 0.00–1.49)

## 2011-12-14 NOTE — Progress Notes (Signed)
ANTICOAGULATION CONSULT NOTE - Followup Consult  Pharmacy Consult for Heparin Indication: chest pain/ACS  No Known Allergies  Patient Measurements: Height: 5\' 7"  (170.2 cm) Weight: 197 lb 1.5 oz (89.4 kg) (per bed scale) IBW/kg (Calculated) : 66.1  Heparin Dosing Weight: 85 kg  Vital Signs: Temp: 97.4 F (36.3 C) (06/01 1200) Temp src: Oral (06/01 1200) BP: 123/54 mmHg (06/01 0800) Pulse Rate: 72  (06/01 0800)  Labs:  Basename 12/14/11 0510 12/13/11 1439 12/13/11 0510 12/12/11 2022  HGB 9.8* -- -- --  HCT 31.4* -- -- --  PLT 115* -- -- --  APTT -- -- -- --  LABPROT 27.0* -- 31.8* 27.3*  INR 2.45* -- 3.02* 2.49*  HEPARINUNFRC -- -- -- --  CREATININE 2.09* 2.25* 2.13* --  CKTOTAL -- -- -- --  CKMB -- -- -- --  TROPONINI -- -- -- --    Estimated Creatinine Clearance: 26.6 ml/min (by C-G formula based on Cr of 2.09).   Medications:  Prescriptions prior to admission  Medication Sig Dispense Refill  . aspirin EC 81 MG tablet Take 81 mg by mouth daily.      Marland Kitchen dofetilide (TIKOSYN) 125 MCG capsule Take 250 mcg by mouth 2 (two) times daily.      . fluticasone (FLONASE) 50 MCG/ACT nasal spray Place 2 sprays into the nose daily as needed. For allergies      . furosemide (LASIX) 40 MG tablet Take 60 mg by mouth 2 (two) times daily.      Marland Kitchen gabapentin (NEURONTIN) 100 MG capsule Take 200 mg by mouth daily.       . metoprolol succinate (TOPROL-XL) 50 MG 24 hr tablet Take 50 mg by mouth daily. Take with or immediately following a meal.      . nitroGLYCERIN (NITROSTAT) 0.4 MG SL tablet Place 0.4 mg under the tongue every 5 (five) minutes as needed. For chest pain      . potassium chloride SA (K-DUR,KLOR-CON) 20 MEQ tablet Take 20 mEq by mouth 2 (two) times daily.      Marland Kitchen warfarin (COUMADIN) 5 MG tablet Take 5-7.5 mg by mouth daily. Take 7.5mg  Monday, Wednesday, Friday. Take 5mg  all other days      . zolpidem (AMBIEN) 10 MG tablet Take 10 mg by mouth at bedtime as needed. For sleep    . DISCONTD: furosemide (LASIX) 40 MG tablet Take 1.5 tablets (60 mg total) by mouth 2 (two) times daily.  90 tablet  6  . DISCONTD: potassium chloride SA (K-DUR,KLOR-CON) 20 MEQ tablet Take 1 tablet (20 mEq total) by mouth 2 (two) times daily.  60 tablet  6  . dofetilide (TIKOSYN) 250 MCG capsule Take 1 capsule (250 mcg total) by mouth every 12 (twelve) hours.  60 capsule  1    Assessment: 78 y.o.  male  admitted 12/12/2011 from [place] after code blue with VT arrest and torsades (on Tikosyn PTA). Pharmacy consulted to dose heparin once INR </= 2 on Coumadin PTA (ACS indication).  INR today is 2.45.  Goal of Therapy:  Heparin level 0.3-0.7 units/ml Monitor platelets by anticoagulation protocol: Yes   Plan:  1. Follow up INR, will order daily INRs until <2. . Thank you for allowing pharmacy to be a part of this patients care team.  Maudry Mayhew, PharmD Pgr [phone] 12/14/2011 1:13 PM

## 2011-12-14 NOTE — Progress Notes (Signed)
Juan Bottoms, MD, [facility] ABIM Board Certified in Adult Cardiovascular Medicine,Internal Medicine and Critical Care Medicine      Subjective:    Patient's nausea and vomiting has resolved after discontinuation of lidocaine.  He appeared to have had a good night.  He denies any chest pain or shortness of breath.  I had the patient gently sedated at the bedside this morning and is enjoying his breakfast.  He reports no dizziness.  He reports no palpitations orthopnea or PND.  He appears to be much improved since I last saw him.The patient's appetite has improved significantly.    Objective:   Weight Range:  Vital Signs:   Temp:  [97.4 F (36.3 C)-97.9 F (36.6 C)] 97.7 F (36.5 C) (06/01 0800) Pulse Rate:  [55-78] 72  (06/01 0800) Resp:  [12-20] 15  (06/01 0800) BP: (113-158)/(45-75) 123/54 mmHg (06/01 0800) SpO2:  [93 %-100 %] 97 % (06/01 0800) Weight:  [197 lb 1.5 oz (89.4 kg)] 197 lb 1.5 oz (89.4 kg) (06/01 0500) Last BM Date: 12/11/11  Weight change: Filed Weights   12/13/11 0330 12/13/11 0500 12/14/11 0500  Weight: 198 lb 6.6 oz (90 kg) 196 lb 3.4 oz (89 kg) 197 lb 1.5 oz (89.4 kg)    Intake/Output:   Intake/Output Summary (Last 24 hours) at 12/14/11 0855 Last data filed at 12/14/11 0500  Gross per 24 hour  Intake   1830 ml  Output    950 ml  Net    880 ml     Physical Exam: General: Well-nourished white male in no distress in doing his breakfast Neck: Normal carotid upstroke and no carotid bruits.  Pulses parvus and tardus.  JVP 7 cm Lungs: Crackles bilaterally Heart: Regular rate and rhythm with 3/6 crescendo decrescendo murmur late peaking with decreased S2 and positive Gallivardin sign Abdomen: Within normal limits Extremity exam: Trace edema bilaterally   Telemetry:Normal sinus rhythm  Labs: Basic Metabolic Panel:  Lab 12/14/11 1610 12/13/11 1439 12/13/11 0510  NA 136 136 134*  K 4.7 5.3* 6.0*  CL 99 98 98  CO2 26 25 23   GLUCOSE 79 140* 154*  BUN  53* 50* 44*  CREATININE 2.09* 2.25* 2.13*  CALCIUM 8.6 8.8 9.2  MG -- -- --  PHOS -- -- --    Liver Function Tests: No results found for this basename: AST:5,ALT:5,ALKPHOS:5,BILITOT:5,PROT:5,ALBUMIN:5 in the last 168 hours No results found for this basename: LIPASE:5,AMYLASE:5 in the last 168 hours No results found for this basename: AMMONIA:3 in the last 168 hours  CBC:  Lab 12/14/11 0510  WBC 6.5  NEUTROABS --  HGB 9.8*  HCT 31.4*  MCV 90.0  PLT 115*    Cardiac Enzymes: No results found for this basename: CKTOTAL:5,CKMB:5,CKMBINDEX:5,TROPONINI:5 in the last 168 hours   BNP: BNP (last 3 results)  Basename 09/11/11 0522 06/21/11 1150  PROBNP 2810.0* 3013.0*    ABG    Component Value Date/Time   PHART 7.362 06/20/2011 0809   PCO2ART 38.1 06/20/2011 0809   PO2ART 73.0* 06/20/2011 0809   HCO3 22.5 06/20/2011 0817   TCO2 24 06/20/2011 0817   ACIDBASEDEF 3.0* 06/20/2011 0817   O2SAT 63.0 06/20/2011 0817     Other results:  RUE:AVWUJW sinus rhythm heart rate 84 bpm  Echocardiogram: Moderate aortic stenosis, ejection fraction 45-50% inferior akinesis and moderate mitral regurgitation.  Imaging:  No results found.   Medications:     Scheduled Medications:    . aspirin EC  81 mg Oral Daily  .  phytonadione  2.5 mg Oral Once  . sodium chloride  3 mL Intravenous Q12H  . sodium polystyrene  15 g Oral Once     Infusions:    . sodium chloride 75 mL/hr at 12/13/11 0817     PRN Medications:  sodium chloride, acetaminophen, nitroGLYCERIN, ondansetron (ZOFRAN) IV, sodium chloride   Assessment:   #1 acute on chronic diastolic heart failure #2 Ventricular tachycardia(Polymorphic ventricular tachycardia) arrest requiring CPR and no cardioversion-likely secondary to ischemic mediated cause secondary to severe aortic stenosis and prior coronary bypass grafting #3 severe aortic stenosis #4 hyperkalemia #5 acute and chronic renal insufficiency-Creatinine slowly  improving #6 paroxysmal atrial fibrillation-currently normal sinus rhythm #7 anticoagulation INR  2.45 #8 dofetilide therapy-QTC stable #9 anemia #10 thrombocytopenia #11 status post failed radio catheter frequency ablation for atrial flutter 2009 by Dr. Ladona Ridgel.   Plan/Discussion:    I have to review the echocardiogram today.  Previously aortic stenosis was noted that severe.  On the current echocardiogram performed here it is noted as moderate but this is in the setting of LV dysfunction.  I will discuss this with Dr. Excell Seltzer.  Creatinine is slowly improving and hopefully we can proceed with cardiac catheterization early next week.  The patient may need a transesophageal echocardiogram in the meanwhile to assess his aortic stenosis further. Although the patient had a VT arrest this was polymorphic likely related to ischemia.  QTC however was prolonged at that time we will also ask input from the EP service particularly that's felt that his aortic stenosis is only moderate. Patient is currently normal sinus rhythm. We'll continue gentle hydration and close monitoring of his creatinine as well as BNP level.  Length of Stay: 2   Alvin Critchley [facility] 12/14/2011, 8:55 AM

## 2011-12-15 LAB — CBC
HCT: 33.2 % — ABNORMAL LOW (ref 39.0–52.0)
MCH: 28.6 pg (ref 26.0–34.0)
MCHC: 31.3 g/dL (ref 30.0–36.0)
MCV: 91.2 fL (ref 78.0–100.0)
Platelets: 112 10*3/uL — ABNORMAL LOW (ref 150–400)
RDW: 16.9 % — ABNORMAL HIGH (ref 11.5–15.5)
WBC: 6.4 10*3/uL (ref 4.0–10.5)

## 2011-12-15 LAB — BASIC METABOLIC PANEL
BUN: 36 mg/dL — ABNORMAL HIGH (ref 6–23)
CO2: 26 mEq/L (ref 19–32)
Calcium: 8.4 mg/dL (ref 8.4–10.5)
Creatinine, Ser: 1.32 mg/dL (ref 0.50–1.35)
Glucose, Bld: 87 mg/dL (ref 70–99)

## 2011-12-15 MED ORDER — POTASSIUM CHLORIDE CRYS ER 20 MEQ PO TBCR
40.0000 meq | EXTENDED_RELEASE_TABLET | Freq: Once | ORAL | Status: AC
  Administered 2011-12-15: 40 meq via ORAL
  Filled 2011-12-15: qty 2

## 2011-12-15 MED ORDER — SODIUM CHLORIDE 0.9 % IV SOLN
250.0000 mL | INTRAVENOUS | Status: DC | PRN
  Administered 2011-12-15: 10 mL via INTRAVENOUS
  Administered 2011-12-17: 250 mL via INTRAVENOUS

## 2011-12-15 MED ORDER — HEPARIN (PORCINE) IN NACL 100-0.45 UNIT/ML-% IJ SOLN
1000.0000 [IU]/h | INTRAMUSCULAR | Status: DC
  Administered 2011-12-15 – 2011-12-17 (×4): 1000 [IU]/h via INTRAVENOUS
  Filled 2011-12-15 (×6): qty 250

## 2011-12-15 MED ORDER — HEPARIN BOLUS VIA INFUSION
4000.0000 [IU] | Freq: Once | INTRAVENOUS | Status: AC
  Administered 2011-12-15: 4000 [IU] via INTRAVENOUS
  Filled 2011-12-15: qty 4000

## 2011-12-15 NOTE — Progress Notes (Signed)
ANTICOAGULATION CONSULT NOTE - Follow Up Consult  Pharmacy Consult for Heparin Indication: chest pain/ACS  No Known Allergies  Patient Measurements: Height: 5\' 7"  (170.2 cm) Weight: 197 lb 5 oz (89.5 kg) IBW/kg (Calculated) : 66.1  Heparin Dosing Weight:   Vital Signs: Temp: 98.7 F (37.1 C) (06/02 1931) Temp src: Oral (06/02 1600) BP: 148/60 mmHg (06/02 1900) Pulse Rate: 125  (06/02 1931)  Labs:  Basename 12/15/11 1833 12/15/11 0600 12/14/11 0510 12/13/11 1439 12/13/11 0510  HGB -- 10.4* 9.8* -- --  HCT -- 33.2* 31.4* -- --  PLT -- 112* 115* -- --  APTT -- -- -- -- --  LABPROT -- 18.3* 27.0* -- 31.8*  INR -- 1.49 2.45* -- 3.02*  HEPARINUNFRC 0.37 -- -- -- --  CREATININE -- 1.32 2.09* 2.25* --  CKTOTAL -- -- -- -- --  CKMB -- -- -- -- --  TROPONINI -- -- -- -- --    Estimated Creatinine Clearance: 42.1 ml/min (by C-G formula based on Cr of 1.32).  Assessment: 78yo male with ACS, on heparin at 1000units/hr.  Heparin level is therapeutic with no problems noted.   Goal of Therapy:  Heparin level 0.3-0.7 units/ml Monitor platelets by anticoagulation protocol: Yes   Plan:  1.  Continue heparin at current rate. 2.. Repeat Heparin level in 6hr to verify therapeutic.   Marisue Humble, PharmD Clinical Pharmacist [facility]

## 2011-12-15 NOTE — Progress Notes (Signed)
Juan Bottoms, MD, [facility] ABIM Board Certified in Adult Cardiovascular Medicine,Internal Medicine and Critical Care Medicine      Subjective:     The patient states she is doing well this morning.  He had some minor chills.  No fevers were recorded however.  He still sore in the chest from his CPR.  He has no angina however.  He has no orthopnea or PND.  He reports no palpitations or cough.  He has noticedtHe is diuresing a lot more  Objective:   Weight Range:  Vital Signs:   Temp:  [97.4 F (36.3 C)-98.6 F (37 C)] 98.2 F (36.8 C) (06/02 0700) Pulse Rate:  [73-88] 76  (06/02 0700) Resp:  [12-19] 12  (06/02 0700) BP: (105-150)/(42-86) 130/63 mmHg (06/02 0700) SpO2:  [92 %-100 %] 100 % (06/02 0700) Weight:  [197 lb 5 oz (89.5 kg)] 197 lb 5 oz (89.5 kg) (06/02 0500) Last BM Date: 12/11/11  Weight change: Filed Weights   12/13/11 0500 12/14/11 0500 12/15/11 0500  Weight: 196 lb 3.4 oz (89 kg) 197 lb 1.5 oz (89.4 kg) 197 lb 5 oz (89.5 kg)    Intake/Output:   Intake/Output Summary (Last 24 hours) at 12/15/11 0850 Last data filed at 12/15/11 0500  Gross per 24 hour  Intake    700 ml  Output   2375 ml  Net  -1675 ml     Physical Exam: General: Well-nourished white male in no distress complaining of some chills Neck: No abnormalities, pulsus parvus and tardus Lungs: Clear breath sounds bilaterally without any wheezing Heart: Irregular rate and rhythm with normal S1 and S2 decreased in intensity3/ 6 late peaking systolic murmur.  Positive Gallivardin sign. Extremities: No significant edema  Telemetry: Atrial fibrillation with rate control  Labs: Basic Metabolic Panel:  Lab 12/15/11 1610 12/14/11 0510 12/13/11 1439 12/13/11 0510  NA 138 136 136 134*  K 3.7 4.7 5.3* 6.0*  CL 103 99 98 98  CO2 26 26 25 23   GLUCOSE 87 79 140* 154*  BUN 36* 53* 50* 44*  CREATININE 1.32 2.09* 2.25* 2.13*  CALCIUM 8.4 8.6 8.8 --  MG -- -- -- --  PHOS -- -- -- --    Liver Function  Tests: No results found for this basename: AST:5,ALT:5,ALKPHOS:5,BILITOT:5,PROT:5,ALBUMIN:5 in the last 168 hours No results found for this basename: LIPASE:5,AMYLASE:5 in the last 168 hours No results found for this basename: AMMONIA:3 in the last 168 hours  CBC:  Lab 12/15/11 0600 12/14/11 0510  WBC 6.4 6.5  NEUTROABS -- --  HGB 10.4* 9.8*  HCT 33.2* 31.4*  MCV 91.2 90.0  PLT 112* 115*    Cardiac Enzymes: No results found for this basename: CKTOTAL:5,CKMB:5,CKMBINDEX:5,TROPONINI:5 in the last 168 hours   BNP: BNP (last 3 results)  Basename 12/15/11 0600 09/11/11 0522 06/21/11 1150  PROBNP 3849.0* 2810.0* 3013.0*    ABG    Component Value Date/Time   PHART 7.362 06/20/2011 0809   PCO2ART 38.1 06/20/2011 0809   PO2ART 73.0* 06/20/2011 0809   HCO3 22.5 06/20/2011 0817   TCO2 24 06/20/2011 0817   ACIDBASEDEF 3.0* 06/20/2011 0817   O2SAT 63.0 06/20/2011 0817     Other results:  EKG: Not obtained  Imaging:  No results found.   Medications:     Scheduled Medications:    . aspirin EC  81 mg Oral Daily  . sodium chloride  3 mL Intravenous Q12H     Infusions:     PRN Medications:  sodium chloride, acetaminophen, nitroGLYCERIN, ondansetron (ZOFRAN) IV, sodium chloride   Assessment:   #1 acute on chronic diastolic heart failure  #2 Ventricular tachycardia(Polymorphic ventricular tachycardia) arrest requiring CPR and no cardioversion-likely secondary to ischemic mediated cause secondary to severe aortic stenosis and prior coronary bypass grafting  #3 severe aortic stenosis  #4 hyperkalemia  #5 acute and chronic renal insufficiency-Creatinine slowly improving creatinine improved to 1.3 #6 paroxysmal atrial fibrillation-currently normal sinus rhythm  #7 anticoagulation INR 2.45  #8 dofetilide therapy-QTC stable  #9 anemia  #10 thrombocytopenia  #11 status post failed radio catheter frequency ablation for atrial flutter 2009 by Dr. Ladona Ridgel.  #12 post ATN  diuresis    Plan/Discussion:    We'll have Dr. Excell Seltzer and a decision tomorrow as to the timing of the cardiac catheterization.  His creatinine has come down significantly and is suspected tomorrow to be normal.  However we may want to give the patient one additional day of recovery with his renal function and I will also give him some IV fluids today to keep up with this post ATN diuresis. I reviewed personally the echocardiogram and I do not agree with the reading of moderate aortic stenosis.  Visually AS appears to be severe.  The patient could have severe aortic stenosis with a low gradient.  I will plan on a transesophageal echocardiogram tomorrow to further evaluate the aortic valve which will give [place] an additional day prior to cardiac catheterization and possible balloon valvuloplasty.  It will provide [place] also with additional information to make sure that his aortic stenosis is severe, as well as during the catheterization hemodynamic data will need to be obtained to assess his aortic stenosis. The patient is concerned that his problems that occurred that morning at the hospital work caused by dye allergy.  I have no evidence of this but it may be prudent to pretreat him prior to cardiac catheterization. Risks of the Procedure  Possible risks associated with a transesophageal echocardiogram include, but are not limited to, the following:   breathing problems   heart rhythm problems   infection of the heart valves   bleeding of the esophagus Patients with known problems of the esophagus, such as esophageal varices, esophageal obstruction, or radiation therapy to the area of the esophagus should be evaluated carefully by the physician before having the procedure. Patients who are allergic to or sensitive to medications or latex should notify their physician. If you are pregnant or suspect that you may be pregnant, you should notify your physician. There may be other risks depending upon your  specific medical condition. Be sure to discuss any concerns with your physician prior to the procedure.   Length of Stay: 3   Alvin Critchley [facility] 12/15/2011, 8:50 AM

## 2011-12-15 NOTE — Progress Notes (Addendum)
ANTICOAGULATION CONSULT NOTE - Followup Consult  Pharmacy Consult for Heparin Indication: chest pain/ACS  No Known Allergies  Patient Measurements: Height: 5\' 7"  (170.2 cm) Weight: 197 lb 5 oz (89.5 kg) IBW/kg (Calculated) : 66.1  Heparin Dosing Weight: 85 kg  Vital Signs: Temp: 98.2 F (36.8 C) (06/02 0700) Temp src: Oral (06/02 0700) BP: 130/63 mmHg (06/02 0700) Pulse Rate: 76  (06/02 0700)  Labs:  Basename 12/15/11 0600 12/14/11 0510 12/13/11 1439 12/13/11 0510  HGB 10.4* 9.8* -- --  HCT 33.2* 31.4* -- --  PLT 112* 115* -- --  APTT -- -- -- --  LABPROT 18.3* 27.0* -- 31.8*  INR 1.49 2.45* -- 3.02*  HEPARINUNFRC -- -- -- --  CREATININE 1.32 2.09* 2.25* --  CKTOTAL -- -- -- --  CKMB -- -- -- --  TROPONINI -- -- -- --    Estimated Creatinine Clearance: 42.1 ml/min (by C-G formula based on Cr of 1.32).   Medications:  Prescriptions prior to admission  Medication Sig Dispense Refill  . aspirin EC 81 MG tablet Take 81 mg by mouth daily.      Marland Kitchen dofetilide (TIKOSYN) 125 MCG capsule Take 250 mcg by mouth 2 (two) times daily.      . fluticasone (FLONASE) 50 MCG/ACT nasal spray Place 2 sprays into the nose daily as needed. For allergies      . furosemide (LASIX) 40 MG tablet Take 60 mg by mouth 2 (two) times daily.      Marland Kitchen gabapentin (NEURONTIN) 100 MG capsule Take 200 mg by mouth daily.       . metoprolol succinate (TOPROL-XL) 50 MG 24 hr tablet Take 50 mg by mouth daily. Take with or immediately following a meal.      . nitroGLYCERIN (NITROSTAT) 0.4 MG SL tablet Place 0.4 mg under the tongue every 5 (five) minutes as needed. For chest pain      . potassium chloride SA (K-DUR,KLOR-CON) 20 MEQ tablet Take 20 mEq by mouth 2 (two) times daily.      Marland Kitchen warfarin (COUMADIN) 5 MG tablet Take 5-7.5 mg by mouth daily. Take 7.5mg  Monday, Wednesday, Friday. Take 5mg  all other days      . zolpidem (AMBIEN) 10 MG tablet Take 10 mg by mouth at bedtime as needed. For sleep       .  DISCONTD: furosemide (LASIX) 40 MG tablet Take 1.5 tablets (60 mg total) by mouth 2 (two) times daily.  90 tablet  6  . DISCONTD: potassium chloride SA (K-DUR,KLOR-CON) 20 MEQ tablet Take 1 tablet (20 mEq total) by mouth 2 (two) times daily.  60 tablet  6  . dofetilide (TIKOSYN) 250 MCG capsule Take 1 capsule (250 mcg total) by mouth every 12 (twelve) hours.  60 capsule  1    Assessment: 78 y.o.  male  admitted 12/12/2011 from [place] after code blue with VT arrest and torsades (on Tikosyn PTA). Pharmacy consulted to dose heparin once INR </= 2 on Coumadin PTA (ACS indication).  INR today is 1.49 down from 2.45 yesterday. We will begin IV heparin for ACS, pt also has hx AFib. CBC stable, improving renal function.   Goal of Therapy:  Heparin level 0.3-0.7 units/ml Monitor platelets by anticoagulation protocol: Yes   Plan:  1. Heparin bolus 4000 units x1 2. Heparin infusion 1000 units/h (=10 ml/h) 3. F/u 8h heparin level at 1900 4. Daily heparin level and CBC; D/c daily INR . Thank you for allowing pharmacy to be a part  of this patients care team.  Maudry Mayhew, PharmD Pgr [phone] 12/15/2011 9:43 AM

## 2011-12-16 ENCOUNTER — Encounter (HOSPITAL_COMMUNITY)
Admission: EM | Disposition: A | Discharge status: Home / Self Care | Payer: Self-pay | Source: Other Acute Inpatient Hospital | Attending: Cardiology

## 2011-12-16 DIAGNOSIS — I2510 Atherosclerotic heart disease of native coronary artery without angina pectoris: Secondary | ICD-10-CM

## 2011-12-16 DIAGNOSIS — I359 Nonrheumatic aortic valve disorder, unspecified: Secondary | ICD-10-CM

## 2011-12-16 LAB — BASIC METABOLIC PANEL
BUN: 28 mg/dL — ABNORMAL HIGH (ref 6–23)
CO2: 27 mEq/L (ref 19–32)
Calcium: 8.3 mg/dL — ABNORMAL LOW (ref 8.4–10.5)
Chloride: 107 mEq/L (ref 96–112)
Creatinine, Ser: 1.25 mg/dL (ref 0.50–1.35)
GFR calc Af Amer: 58 mL/min — ABNORMAL LOW (ref >90)
GFR calc non Af Amer: 50 mL/min — ABNORMAL LOW (ref >90)
Glucose, Bld: 109 mg/dL — ABNORMAL HIGH (ref 70–99)
Potassium: 4.3 mEq/L (ref 3.5–5.1)
Sodium: 140 mEq/L (ref 135–145)

## 2011-12-16 LAB — CBC
HCT: 31.8 % — ABNORMAL LOW (ref 39.0–52.0)
Hemoglobin: 9.7 g/dL — ABNORMAL LOW (ref 13.0–17.0)
MCHC: 30.5 g/dL (ref 30.0–36.0)
MCV: 91.6 fL (ref 78.0–100.0)
RDW: 17 % — ABNORMAL HIGH (ref 11.5–15.5)

## 2011-12-16 LAB — HEPARIN LEVEL (UNFRACTIONATED): Heparin Unfractionated: 0.39 IU/mL (ref 0.30–0.70)

## 2011-12-16 SURGERY — ECHOCARDIOGRAM, TRANSESOPHAGEAL
Anesthesia: Moderate Sedation

## 2011-12-16 MED ORDER — MIDAZOLAM HCL 10 MG/2ML IJ SOLN: 10.0000 mg | Freq: Once | INTRAMUSCULAR | Status: DC

## 2011-12-16 MED ORDER — MIDAZOLAM HCL 2 MG/2ML IJ SOLN
INTRAMUSCULAR | Status: AC
  Administered 2011-12-16: 1 mg
  Filled 2011-12-16: qty 2

## 2011-12-16 MED ORDER — BENZOCAINE 20 % MT SOLN
1.0000 "application " | OROMUCOSAL | Status: DC | PRN
  Filled 2011-12-16: qty 57

## 2011-12-16 MED ORDER — FENTANYL CITRATE 0.05 MG/ML IJ SOLN: 250.0000 ug | Freq: Once | INTRAMUSCULAR | Status: DC

## 2011-12-16 MED ORDER — FENTANYL CITRATE 0.05 MG/ML IJ SOLN
INTRAMUSCULAR | Status: AC
  Administered 2011-12-16: 37.5 ug
  Filled 2011-12-16: qty 2

## 2011-12-16 MED ORDER — BISACODYL 10 MG RE SUPP
10.0000 mg | Freq: Every day | RECTAL | Status: DC | PRN
  Administered 2011-12-17: 10 mg via RECTAL
  Filled 2011-12-16 (×2): qty 1

## 2011-12-16 MED ORDER — MIDAZOLAM HCL 2 MG/2ML IJ SOLN
INTRAMUSCULAR | Status: AC
  Administered 2011-12-16: 2 mg
  Filled 2011-12-16: qty 2

## 2011-12-16 MED ORDER — SODIUM CHLORIDE 0.45 % IV SOLN: INTRAVENOUS | Status: DC

## 2011-12-16 NOTE — Progress Notes (Signed)
*  PRELIMINARY RESULTS* Echocardiogram Echocardiogram Transesophageal has been performed.  Glean Salen [facility] 12/16/2011, 3:07 PM

## 2011-12-16 NOTE — Progress Notes (Signed)
I have reviewed the patient's transthoracic and transesophageal echoes. The TEE shows a planimeter valve area of 0.4 cm. I agree that the patient has severe aortic stenosis. Recommend cardiac catheterization to rule out severe saphenous vein graft stenosis in this patient with vein graft PCI in the past 12 months. Depending on those findings, will plan on proceeding with balloon aortic valvuloplasty for symptomatic relief of severe aortic stenosis. Depending on his response to balloon valvuloplasty, he may ultimately turn out to be a candidate for TAVR. I reviewed the risks of balloon aortic valvuloplasty in detail with the patient. These risks include but are not limited to stroke, myocardial infarction, aortic annular rupture, pericardial effusion and tamponade, vascular injury/bleeding, and death. He understands these risks and agrees to proceed. His children were present for this discussion as well. Will schedule for Wednesday morning.  Juan Wolf 12/16/2011 5:57 PM

## 2011-12-16 NOTE — Progress Notes (Signed)
ANTICOAGULATION CONSULT NOTE - Follow Up Consult  Pharmacy Consult for Heparin Indication: chest pain/ACS  No Known Allergies  Vital Signs: Temp: 98.1 F (36.7 C) (06/03 0800) Temp src: Oral (06/03 0800) BP: 135/69 mmHg (06/03 0800) Pulse Rate: 109  (06/03 0800)  Labs:  Basename 12/16/11 0540 12/16/11 0215 12/15/11 1833 12/15/11 0600 12/14/11 0510  HGB -- 9.7* -- 10.4* --  HCT -- 31.8* -- 33.2* 31.4*  PLT -- 108* -- 112* 115*  APTT -- -- -- -- --  LABPROT -- -- -- 18.3* 27.0*  INR -- -- -- 1.49 2.45*  HEPARINUNFRC 0.39 -- 0.37 -- --  CREATININE -- 1.25 -- 1.32 2.09*  CKTOTAL -- -- -- -- --  CKMB -- -- -- -- --  TROPONINI -- -- -- -- --    Estimated Creatinine Clearance: 44.6 ml/min (by C-G formula based on Cr of 1.25).  Medications:  Heparin @ 1000 units/hr  Assessment: 89yom continues on heparin for afib while coumadin on hold in anticipation of cath/valvuloplasty this week.  Heparin level is therapeutic. No bleeding per chart notes. CBC stable. Renal function continues to improve.  Goal of Therapy:  Heparin level 0.3-0.7 units/ml Monitor platelets by anticoagulation protocol: Yes   Plan:  1) Continue heparin at 1000 units/hr 2) Follow up daily heparin level 3) Follow up TEE, plans for cath  Fredrik Rigger 12/16/2011,8:48 AM

## 2011-12-16 NOTE — Progress Notes (Signed)
    SUBJECTIVE: no complaints this am. Pt denies CP.   BP 123/62  Pulse 67  Temp(Src) 97.6 F (36.4 C) (Oral)  Resp 15  Ht 5\' 7"  (1.702 m)  Wt 198 lb 10.2 oz (90.1 kg)  BMI 31.11 kg/m2  SpO2 100%  Intake/Output Summary (Last 24 hours) at 12/16/11 0741 Last data filed at 12/16/11 0500  Gross per 24 hour  Intake   1040 ml  Output   1050 ml  Net    -10 ml    PHYSICAL EXAM General: Well developed, well nourished, in no acute distress. Alert and oriented x 3.  Psych:  Good affect, responds appropriately Neck: No JVD. No masses noted.  Lungs: Clear bilaterally with scattered rhonci. No wheezes noted.  Heart: Irregular with systolic murmur noted.  Abdomen: Bowel sounds are present. Soft, non-tender.  Extremities: No lower extremity edema.   LABS: Basic Metabolic Panel:  Basename 12/16/11 0215 12/15/11 0600  NA 140 138  K 4.3 3.7  CL 107 103  CO2 27 26  GLUCOSE 109* 87  BUN 28* 36*  CREATININE 1.25 1.32  CALCIUM 8.3* 8.4  MG -- --  PHOS -- --   CBC:  Basename 12/16/11 0215 12/15/11 0600  WBC 6.8 6.4  NEUTROABS -- --  HGB 9.7* 10.4*  HCT 31.8* 33.2*  MCV 91.6 91.2  PLT 108* 112*   Current Meds:    . aspirin EC  81 mg Oral Daily  . heparin  4,000 Units Intravenous Once  . potassium chloride  40 mEq Oral Once  . sodium chloride  3 mL Intravenous Q12H     ASSESSMENT AND PLAN:  1. Acute on chronic diastolic CHF secondary to LVH/diastolic dysfunction/aortic stenosis: He has diuresed well and dyspnea is improved.   2. VT arrest - Stable. No recurrence. Suspect ischemic mediated. Plans for cardiac cath this week.   3. Severe AS - Per plans of Dr. Andee Lineman yesterday, will plan TEE this am to assess valve. Possible valvuloplasty this week.  Pt not a candidate for surgical treatment of AS.   4. Acute on chronic renal insufficiency: Resolving. Near baseline.   5. PAF on chronic anticoagulation:  Rate controlled. INR 1.4 yesterday. Holding coumadin in anticipation  of cath this week.   6. CAD: Stable. Will assess coronaries and bypasses later this week.    7. Dispo - Possible cath this week with balloon aortic valvuloplasty. TEE this am.     Juan Wolf  6/3/20137:41 AM

## 2011-12-17 DIAGNOSIS — I4892 Unspecified atrial flutter: Secondary | ICD-10-CM

## 2011-12-17 LAB — BASIC METABOLIC PANEL
BUN: 26 mg/dL — ABNORMAL HIGH (ref 6–23)
Chloride: 106 mEq/L (ref 96–112)
Glucose, Bld: 94 mg/dL (ref 70–99)
Potassium: 4.6 mEq/L (ref 3.5–5.1)

## 2011-12-17 LAB — HEPARIN LEVEL (UNFRACTIONATED): Heparin Unfractionated: 0.47 IU/mL (ref 0.30–0.70)

## 2011-12-17 LAB — CBC
Hemoglobin: 10.4 g/dL — ABNORMAL LOW (ref 13.0–17.0)
MCH: 28.3 pg (ref 26.0–34.0)
MCV: 93.2 fL (ref 78.0–100.0)
RBC: 3.68 MIL/uL — ABNORMAL LOW (ref 4.22–5.81)

## 2011-12-17 MED ORDER — SODIUM CHLORIDE 0.9 % IV SOLN
INTRAVENOUS | Status: DC
  Administered 2011-12-18: 04:00:00 via INTRAVENOUS

## 2011-12-17 MED ORDER — SODIUM CHLORIDE 0.9 % IJ SOLN: 3.0000 mL | Freq: Two times a day (BID) | INTRAMUSCULAR | Status: DC

## 2011-12-17 MED ORDER — ASPIRIN 81 MG PO CHEW
324.0000 mg | CHEWABLE_TABLET | ORAL | Status: AC
  Administered 2011-12-18: 324 mg via ORAL
  Filled 2011-12-17: qty 4

## 2011-12-17 MED ORDER — SODIUM CHLORIDE 0.9 % IV SOLN
250.0000 mL | INTRAVENOUS | Status: DC | PRN
  Administered 2011-12-17: 22:00:00 via INTRAVENOUS

## 2011-12-17 MED ORDER — SODIUM CHLORIDE 0.9 % IJ SOLN: 3.0000 mL | INTRAMUSCULAR | Status: DC | PRN

## 2011-12-17 MED ORDER — DIAZEPAM 2 MG PO TABS
2.0000 mg | ORAL_TABLET | ORAL | Status: AC
  Administered 2011-12-18: 2 mg via ORAL

## 2011-12-17 MED ORDER — FUROSEMIDE 40 MG PO TABS
40.0000 mg | ORAL_TABLET | Freq: Every day | ORAL | Status: DC
  Administered 2011-12-17 – 2011-12-18 (×2): 40 mg via ORAL
  Filled 2011-12-17 (×2): qty 1

## 2011-12-17 NOTE — Progress Notes (Signed)
ANTICOAGULATION CONSULT NOTE - Follow Up Consult  Pharmacy Consult for Heparin Indication: chest pain/ACS  No Known Allergies  Vital Signs: Temp: 97.1 F (36.2 C) (06/04 0800) Temp src: Oral (06/04 0800) BP: 152/83 mmHg (06/04 0900) Pulse Rate: 84  (06/04 0800)  Labs:  Basename 12/17/11 1008 12/17/11 0607 12/16/11 0540 12/16/11 0215 12/15/11 0600  HGB -- 10.4* -- 9.7* --  HCT -- 34.3* -- 31.8* 33.2*  PLT -- 125* -- 108* 112*  APTT -- -- -- -- --  LABPROT -- -- -- -- 18.3*  INR -- -- -- -- 1.49  HEPARINUNFRC 0.47 >2.00* 0.39 -- --  CREATININE -- 1.10 -- 1.25 1.32  CKTOTAL -- -- -- -- --  CKMB -- -- -- -- --  TROPONINI -- -- -- -- --    Estimated Creatinine Clearance: 50.8 ml/min (by C-G formula based on Cr of 1.1).  Medications:  Heparin @ 1000 units/hr  Assessment: 89yom continues on heparin for afib while coumadin on hold in anticipation of cath/valvuloplasty tomorrow. Heparin level this morning was >2 but drawn inappropriately at IV site. Repeat heparin level drawn correctly is therapeutic.  No bleeding per chart notes. CBC stable, renal function improved.  Goal of Therapy:  Heparin level 0.3-0.7 units/ml Monitor platelets by anticoagulation protocol: Yes   Plan:  1) Continue heparin at 1000 units/hr 2) Follow up after cath 6/5  Fredrik Rigger 12/17/2011,11:04 AM

## 2011-12-17 NOTE — Progress Notes (Signed)
    SUBJECTIVE: No chest pain or SOB. No events.   BP 118/53  Pulse 84  Temp(Src) 97.1 F (36.2 C) (Oral)  Resp 21  Ht 5\' 7"  (1.702 m)  Wt 199 lb 8.3 oz (90.5 kg)  BMI 31.25 kg/m2  SpO2 97%  Intake/Output Summary (Last 24 hours) at 12/17/11 1151 Last data filed at 12/17/11 1100  Gross per 24 hour  Intake    720 ml  Output    970 ml  Net   -250 ml    PHYSICAL EXAM General: Well developed, well nourished, in no acute distress. Alert and oriented x 3.  Psych:  Good affect, responds appropriately Neck: No JVD. No masses noted.  Lungs: Clear bilaterally with no wheezes or rhonci noted.  Heart: Irregular with loud systolic murmur noted.  Abdomen: Bowel sounds are present. Soft, non-tender.  Extremities: No lower extremity edema.   LABS: Basic Metabolic Panel:  Basename 12/17/11 0607 12/16/11 0215  NA 140 140  K 4.6 4.3  CL 106 107  CO2 26 27  GLUCOSE 94 109*  BUN 26* 28*  CREATININE 1.10 1.25  CALCIUM 8.7 8.3*  MG -- --  PHOS -- --   CBC:  Basename 12/17/11 0607 12/16/11 0215  WBC 6.9 6.8  NEUTROABS -- --  HGB 10.4* 9.7*  HCT 34.3* 31.8*  MCV 93.2 91.6  PLT 125* 108*    Current Meds:    . aspirin EC  81 mg Oral Daily  . fentaNYL      . fentaNYL  250 mcg Intravenous Once  . midazolam      . midazolam      . midazolam  10 mg Intravenous Once  . sodium chloride  3 mL Intravenous Q12H     ASSESSMENT AND PLAN:  1. Acute on chronic diastolic CHF secondary to LVH/diastolic dysfunction/aortic stenosis: He has diuresed well and dyspnea is improved. I/O balanced off of diuretics. BNP is still elevated. Will resume Lasix 40 mg po Qdaily.   2. VT arrest - Stable. No recurrence. This could be ischemia related.  Plans for cardiac cath tomorrow to assess coronary anatomy including grafts.  3. Severe AS - TEE yesterday with severe AS. Plans for cardiac cath tomorrow and possible valvuloplasty if coronaries and grafts are stable. Pt not a candidate for surgical  treatment of AS. He may be a TAVR candidate in the future.  4. Acute on chronic renal insufficiency: Resolved. At baseline.   5. PAF on chronic anticoagulation: Rate controlled. INR 1.4 on 12/15/11. Holding coumadin in anticipation of cath this week. He is on a heparin drip.   6. CAD: Will assess coronaries and bypasses tomorrow.  7. Dispo - Cath tomorrow. NPO at midnight.      Juan Wolf  6/4/201311:51 AM

## 2011-12-18 ENCOUNTER — Encounter (HOSPITAL_COMMUNITY)
Admission: EM | Disposition: A | Discharge status: Home / Self Care | Payer: Self-pay | Source: Other Acute Inpatient Hospital | Attending: Cardiology

## 2011-12-18 DIAGNOSIS — I25810 Atherosclerosis of coronary artery bypass graft(s) without angina pectoris: Secondary | ICD-10-CM

## 2011-12-18 HISTORY — PX: BALLOON VALVULOPLASTY: SHX5741

## 2011-12-18 HISTORY — PX: LEFT HEART CATHETERIZATION WITH CORONARY/GRAFT ANGIOGRAM: SHX5450

## 2011-12-18 LAB — POCT I-STAT 3, VENOUS BLOOD GAS (G3P V)
Acid-base deficit: 1 mmol/L (ref 0.0–2.0)
Acid-base deficit: 2 mmol/L (ref 0.0–2.0)
Bicarbonate: 23.7 mEq/L (ref 20.0–24.0)
O2 Saturation: 51 %
pCO2, Ven: 44.3 mmHg — ABNORMAL LOW (ref 45.0–50.0)
pH, Ven: 7.352 — ABNORMAL HIGH (ref 7.250–7.300)
pO2, Ven: 28 mmHg — CL (ref 30.0–45.0)

## 2011-12-18 LAB — BASIC METABOLIC PANEL
CO2: 23 mEq/L (ref 19–32)
Calcium: 8.8 mg/dL (ref 8.4–10.5)
Chloride: 104 mEq/L (ref 96–112)
Creatinine, Ser: 1.11 mg/dL (ref 0.50–1.35)
Glucose, Bld: 117 mg/dL — ABNORMAL HIGH (ref 70–99)
Sodium: 138 mEq/L (ref 135–145)

## 2011-12-18 LAB — PROTIME-INR: INR: 1.08 (ref 0.00–1.49)

## 2011-12-18 LAB — CBC
HCT: 30.9 % — ABNORMAL LOW (ref 39.0–52.0)
Hemoglobin: 9.6 g/dL — ABNORMAL LOW (ref 13.0–17.0)
MCV: 90.9 fL (ref 78.0–100.0)
WBC: 5.4 10*3/uL (ref 4.0–10.5)

## 2011-12-18 LAB — PRO B NATRIURETIC PEPTIDE: Pro B Natriuretic peptide (BNP): 5444 pg/mL — ABNORMAL HIGH (ref 0–450)

## 2011-12-18 LAB — HEPARIN LEVEL (UNFRACTIONATED): Heparin Unfractionated: 0.41 IU/mL (ref 0.30–0.70)

## 2011-12-18 SURGERY — LEFT HEART CATHETERIZATION WITH CORONARY/GRAFT ANGIOGRAM
Anesthesia: LOCAL

## 2011-12-18 MED ORDER — NITROGLYCERIN 0.2 MG/ML ON CALL CATH LAB
INTRAVENOUS | Status: AC
  Filled 2011-12-18: qty 1

## 2011-12-18 MED ORDER — WARFARIN - PHARMACIST DOSING INPATIENT: Freq: Every day | Status: DC

## 2011-12-18 MED ORDER — FENTANYL CITRATE 0.05 MG/ML IJ SOLN
INTRAMUSCULAR | Status: AC
  Filled 2011-12-18: qty 2

## 2011-12-18 MED ORDER — ONDANSETRON HCL 4 MG/2ML IJ SOLN: 4.0000 mg | Freq: Four times a day (QID) | INTRAMUSCULAR | Status: DC | PRN

## 2011-12-18 MED ORDER — ALPRAZOLAM 0.25 MG PO TABS
0.2500 mg | ORAL_TABLET | Freq: Once | ORAL | Status: AC
  Administered 2011-12-18: 0.25 mg via ORAL
  Filled 2011-12-18: qty 1

## 2011-12-18 MED ORDER — ACETAMINOPHEN 325 MG PO TABS: 650.0000 mg | ORAL_TABLET | ORAL | Status: DC | PRN

## 2011-12-18 MED ORDER — DIAZEPAM 5 MG PO TABS
ORAL_TABLET | ORAL | Status: AC
  Filled 2011-12-18: qty 1

## 2011-12-18 MED ORDER — CLOPIDOGREL BISULFATE 300 MG PO TABS
ORAL_TABLET | ORAL | Status: AC
  Filled 2011-12-18: qty 2

## 2011-12-18 MED ORDER — ASPIRIN 81 MG PO CHEW
81.0000 mg | CHEWABLE_TABLET | Freq: Every day | ORAL | Status: DC
  Administered 2011-12-19 – 2011-12-21 (×3): 81 mg via ORAL
  Filled 2011-12-18 (×2): qty 1

## 2011-12-18 MED ORDER — MIDAZOLAM HCL 2 MG/2ML IJ SOLN
INTRAMUSCULAR | Status: AC
  Filled 2011-12-18: qty 2

## 2011-12-18 MED ORDER — CLOPIDOGREL BISULFATE 75 MG PO TABS
75.0000 mg | ORAL_TABLET | Freq: Every day | ORAL | Status: DC
  Administered 2011-12-19 – 2011-12-21 (×3): 75 mg via ORAL
  Filled 2011-12-18 (×4): qty 1

## 2011-12-18 MED ORDER — SODIUM CHLORIDE 0.9 % IV SOLN
INTRAVENOUS | Status: AC
  Administered 2011-12-18: 16:00:00 via INTRAVENOUS

## 2011-12-18 MED ORDER — BIVALIRUDIN 250 MG IV SOLR
INTRAVENOUS | Status: AC
  Filled 2011-12-18: qty 250

## 2011-12-18 MED ORDER — WARFARIN SODIUM 7.5 MG PO TABS
7.5000 mg | ORAL_TABLET | Freq: Once | ORAL | Status: AC
  Administered 2011-12-18: 7.5 mg via ORAL
  Filled 2011-12-18: qty 1

## 2011-12-18 MED ORDER — HEPARIN (PORCINE) IN NACL 2-0.9 UNIT/ML-% IJ SOLN
INTRAMUSCULAR | Status: AC
  Filled 2011-12-18: qty 2000

## 2011-12-18 MED ORDER — LIDOCAINE HCL (PF) 1 % IJ SOLN
INTRAMUSCULAR | Status: AC
  Filled 2011-12-18: qty 30

## 2011-12-18 MED ORDER — FUROSEMIDE 10 MG/ML IJ SOLN
40.0000 mg | Freq: Two times a day (BID) | INTRAMUSCULAR | Status: DC
  Administered 2011-12-18 – 2011-12-21 (×6): 40 mg via INTRAVENOUS
  Filled 2011-12-18 (×8): qty 4

## 2011-12-18 NOTE — H&P (View-Only) (Signed)
Pt for cath today. Anemia stable. INR 1.0. Renal function stable. Plan has been reviewed with pt. Further planning in cath lab today per Dr. Cooper. I would switch back to IV Lasix post cath today as his BNP is up and he had mild SOB last night. Lungs overall clear but may be mildly volume overloaded.   Tarus Briski 8:01 AM 12/18/2011  

## 2011-12-18 NOTE — Progress Notes (Signed)
ANTICOAGULATION CONSULT NOTE - Follow Up Consult  Pharmacy Consult for Coumadin Indication: A-Fib  No Known Allergies  Vital Signs: Temp: 98.3 F (36.8 C) (06/05 1602) Temp src: Oral (06/05 1602) BP: 152/70 mmHg (06/05 1615) Pulse Rate: 82  (06/05 1602)  Labs:  Basename 12/18/11 0540 12/17/11 1008 12/17/11 0607 12/16/11 0215  HGB 9.6* -- 10.4* --  HCT 30.9* -- 34.3* 31.8*  PLT 115* -- 125* 108*  APTT -- -- -- --  LABPROT 14.2 -- -- --  INR 1.08 -- -- --  HEPARINUNFRC 0.41 0.47 >2.00* --  CREATININE 1.11 -- 1.10 1.25  CKTOTAL -- -- -- --  CKMB -- -- -- --  TROPONINI -- -- -- --    Estimated Creatinine Clearance: 50.6 ml/min (by C-G formula based on Cr of 1.11).   Assessment: 89yom on heparin with a therapeutic heparin level this morning.  Heparin drip now off after cath.  No bleeding per chart notes. Slight decrease in Hgb and platelets but stable.   Cath today showed stenosis in SVG to OM which was stented with BMS and loaded with clopidogrel 600mg .  Continue clopidogrel x6mo.  Restart Coumadin tonight.  Home dose was 7.5mg  MWF/5mg  all other days and he was admitted with INR at goal 2-3  Goal of Therapy:  Heparin level 0.3-0.7 units/ml Monitor platelets by anticoagulation protocol: Yes INR 2-3   Plan:  Coumadin 7.5mg  x1 tonight  Daily INR  Marcelino Scot 12/18/2011,4:26 PM

## 2011-12-18 NOTE — Progress Notes (Signed)
ANTICOAGULATION CONSULT NOTE - Follow Up Consult  Pharmacy Consult for Heparin Indication: chest pain/ACS  No Known Allergies  Vital Signs: Temp: 98.3 F (36.8 C) (06/05 0745) Temp src: Oral (06/05 0745) BP: 154/74 mmHg (06/05 0800) Pulse Rate: 93  (06/05 0800)  Labs:  Basename 12/18/11 0540 12/17/11 1008 12/17/11 0607 12/16/11 0215  HGB 9.6* -- 10.4* --  HCT 30.9* -- 34.3* 31.8*  PLT 115* -- 125* 108*  APTT -- -- -- --  LABPROT 14.2 -- -- --  INR 1.08 -- -- --  HEPARINUNFRC 0.41 0.47 >2.00* --  CREATININE 1.11 -- 1.10 1.25  CKTOTAL -- -- -- --  CKMB -- -- -- --  TROPONINI -- -- -- --    Estimated Creatinine Clearance: 50.6 ml/min (by C-G formula based on Cr of 1.11).  Medications:  Heparin @ 1000 units/hr  Assessment: 89yom continues on heparin with a therapeutic heparin level.  No bleeding per chart notes. Slight decrease in Hgb and platelets but stable.  For cath/valvuloplasty today.  Goal of Therapy:  Heparin level 0.3-0.7 units/ml Monitor platelets by anticoagulation protocol: Yes   Plan:  1) Continue heparin at 1000 units/hr 2) Follow up after cath  Fredrik Rigger 12/18/2011,8:50 AM

## 2011-12-18 NOTE — CV Procedure (Signed)
Cardiac Catheterization Procedure Note  Name: Juan Wolf MRN: [id number] DOB: 02-Jan-1922  Procedure: Right Heart Cath, Left Heart Cath, Selective Coronary Angiography, saphenous vein graft angiography, stenting of the saphenous vein graft obtuse marginal, Perclose of the right femoral artery  Indication: 78 year old gentleman with congestive heart failure, coronary artery disease status post CABG, and aortic stenosis. There has been discordant information about the degree of his aortic stenosis but it is felt to be moderately severe. By cardiac catheterization last year he had a mild gradient, but by TEE his aortic valve planimetered to 0.4 cm.  He was brought for right and left heart cath for full hemodynamic assessment, possible PCI, and consideration of balloon aortic valvuloplasty depending on hemodynamic findings.   Procedural Details: The right groin was prepped, draped, and anesthetized with 1% lidocaine. Using the modified Seldinger technique a 6 French sheath was placed in the right femoral artery and a 7 French sheath was placed in the right femoral vein. A Swan-Ganz catheter was used for the right heart catheterization. Standard protocol was followed for recording of right heart pressures and sampling of oxygen saturations. Fick cardiac output was calculated. Standard Judkins catheters were used for selective coronary angiography and saphenous vein graft angiography. An AL-1 catheter was used to direct a straight tip wire across the aortic valve and this was changed out for a Langston catheter so that simultaneous pressure recording across the aortic valve to be performed.  The patient tolerated the diagnostic procedure well. After review of the hemodynamics his aortic stenosis was felt to be moderate. There was severe stenosis in the saphenous vein graft to obtuse marginal branch and attention was then turned to perform PCI of this graft. There was a 90% stenosis in the proximal to mid  body of the graft. The patient's vein graft to LAD and vein graft to distal right coronary artery were both patent with continued patency at stent sites in each vein graft. A bare-metal stent platform was planned because of the patient's need for chronic anticoagulation.  Angiomax was used for anticoagulation. A 6 French LCB guide catheter was inserted. Once a therapeutic ACT was achieved, a filter wire was passed across the lesion. The lesion was primarily stented with a 3.5 x 16 mm very flex bare-metal stent deployed at 12 atmospheres. There was an excellent angiographic result with 0% residual stenosis and TIMI-3 flow. The filter basket was retrieved using the retrieval sheath and final angiography confirmed an excellent result. A Perclose device was used for femoral hemostasis. The patient tolerated the procedure well. There were no immediate procedural complications. The patient was transferred to the post catheterization recovery area for further monitoring.  Procedural Findings: Hemodynamics RA 24 RV 67/19 PA 72/33/mean 45 PCWP 29 LV 162/23 AO 147/83  Oxygen saturations: PA 51 AO 94  Cardiac Output (Fick) 4.8 L/m Cardiac Index (Fick) 2.4 l/m/m2   Aortic valve peak to peak gradient 15 mm mercury, mean gradient 19 mm mercury, valve area 0.96 cm  Coronary angiography: Coronary dominance: right  Left mainstem: Mildly calcified, patent without severe obstructive disease.  Left anterior descending (LAD): Moderate calcification with mild proximal disease. The mid vessel is occluded and fills entirely from the graft.  Left circumflex (LCx): Diffuse proximal disease with 95% mid stenosis. Competitive filling from the graft into the second obtuse marginal branch. The first obtuse marginal branch is severely diseased and is small to moderate in caliber  Right coronary artery (RCA): Severe disease with total occlusion in the  midportion of the vessel.  Saphenous vein graft to LAD. The  stent site is widely patent. The graft is widely patent throughout with normal flow into the mid LAD and the first diagonal. The first diagonal has 90% stenosis in this is a small-caliber vessel best for medical treatment. The LAD has diffuse mid and distal disease but no areas of focal stenosis are present.  Saphenous vein graft to second OM: Patent graft with 90% stenosis in the mid body of the graft. The OM branch supplies a large territory.  Saphenous vein graft to distal right coronary artery is widely patent. The stent site is widely patent. The PDA is diffusely diseased. The PLA is widely patent.  Left ventriculography: Deferred  Final Conclusions:   1. Severe native three-vessel coronary artery disease 2. Status post coronary bypass surgery with continued patency of the saphenous vein graft to LAD and saphenous vein graft to right coronary arteries and severe stenosis of the saphenous vein graft to obtuse marginal with successful PCI 3. Hemodynamic findings consistent with moderately severe aortic stenosis 4. Elevated elevated right and left intracardiac pressures consistent with congestive heart failure.  Recommendations: Will resume Coumadin tonight and heparin tomorrow morning. The patient was loaded with 600 mg of Plavix in the Cath Lab and probably should be continued on Plavix for only 30 days. Regarding his aortic valve stenosis, would recommend serial echo evaluation as an outpatient once he recovers from this hospitalization. His aortic stenosis seems to be progressing but currently is not severe. Will diuresis with IV Lasix as his intracardiac filling pressures are markedly elevated.  Tonny Bollman 12/18/2011, 3:18 PM

## 2011-12-18 NOTE — Interval H&P Note (Signed)
History and Physical Interval Note:  12/18/2011 1:47 PM  Juan Wolf  has presented today for surgery, with the diagnosis of Chest pain  The various methods of treatment have been discussed with the patient and family. After consideration of risks, benefits and other options for treatment, the patient has consented to  Procedure(s) (LRB): LEFT HEART CATHETERIZATION WITH CORONARY/GRAFT ANGIOGRAM (N/A) BALLOON VALVULOPLASTY (N/A) as a surgical intervention .  The patients' history has been reviewed, patient examined, no change in status, stable for surgery.  I have reviewed the patients' chart and labs.  Questions were answered to the patient's satisfaction.     Tonny Bollman  12/18/2011 1:47 PM

## 2011-12-18 NOTE — Progress Notes (Signed)
Pt for cath today. Anemia stable. INR 1.0. Renal function stable. Plan has been reviewed with pt. Further planning in cath lab today per Dr. Excell Seltzer. I would switch back to IV Lasix post cath today as his BNP is up and he had mild SOB last night. Lungs overall clear but may be mildly volume overloaded.   Lutisha Knoche 8:01 AM 12/18/2011

## 2011-12-19 ENCOUNTER — Inpatient Hospital Stay (HOSPITAL_COMMUNITY): Payer: Medicare Other

## 2011-12-19 LAB — BASIC METABOLIC PANEL
Chloride: 102 mEq/L (ref 96–112)
Creatinine, Ser: 1.19 mg/dL (ref 0.50–1.35)
GFR calc Af Amer: 61 mL/min — ABNORMAL LOW (ref >90)
Potassium: 3.8 mEq/L (ref 3.5–5.1)

## 2011-12-19 LAB — PROTIME-INR: Prothrombin Time: 14.5 seconds (ref 11.6–15.2)

## 2011-12-19 LAB — CBC
MCV: 90.1 fL (ref 78.0–100.0)
Platelets: 104 10*3/uL — ABNORMAL LOW (ref 150–400)
RDW: 17.2 % — ABNORMAL HIGH (ref 11.5–15.5)
WBC: 6 10*3/uL (ref 4.0–10.5)

## 2011-12-19 LAB — POCT I-STAT 3, ART BLOOD GAS (G3+)
Acid-base deficit: 1 mmol/L (ref 0.0–2.0)
O2 Saturation: 94 %

## 2011-12-19 LAB — POCT ACTIVATED CLOTTING TIME: Activated Clotting Time: 402 seconds

## 2011-12-19 MED ORDER — WARFARIN SODIUM 7.5 MG PO TABS
7.5000 mg | ORAL_TABLET | Freq: Once | ORAL | Status: AC
  Administered 2011-12-19: 7.5 mg via ORAL
  Filled 2011-12-19 (×2): qty 1

## 2011-12-19 MED ORDER — METOPROLOL SUCCINATE ER 50 MG PO TB24: 50.0000 mg | ORAL_TABLET | Freq: Every day | ORAL | Status: DC

## 2011-12-19 MED ORDER — POTASSIUM CHLORIDE CRYS ER 20 MEQ PO TBCR
40.0000 meq | EXTENDED_RELEASE_TABLET | Freq: Every day | ORAL | Status: DC
  Administered 2011-12-19 – 2011-12-21 (×3): 40 meq via ORAL
  Filled 2011-12-19 (×3): qty 2

## 2011-12-19 MED ORDER — METOPROLOL SUCCINATE ER 50 MG PO TB24
50.0000 mg | ORAL_TABLET | Freq: Every day | ORAL | Status: DC
  Administered 2011-12-19 – 2011-12-21 (×3): 50 mg via ORAL
  Filled 2011-12-19 (×3): qty 1

## 2011-12-19 NOTE — Progress Notes (Signed)
    SUBJECTIVE: Dyspnea improved. No chest pain.   BP 141/61  Pulse 79  Temp(Src) 98.5 F (36.9 C) (Oral)  Resp 23  Ht 5\' 7"  (1.702 m)  Wt 195 lb 15.8 oz (88.9 kg)  BMI 30.70 kg/m2  SpO2 96%  Intake/Output Summary (Last 24 hours) at 12/19/11 1022 Last data filed at 12/19/11 0800  Gross per 24 hour  Intake 802.33 ml  Output   2400 ml  Net -1597.67 ml    PHYSICAL EXAM General: Well developed, well nourished, in no acute distress. Alert and oriented x 3.  Psych:  Good affect, responds appropriately Neck: MIld JVD. No masses noted.  Lungs: Clear bilaterally with no wheezes or rhonci noted.  Heart: Irregular with no murmurs noted. Abdomen: Bowel sounds are present. Soft, non-tender.  Extremities: No lower extremity edema.   LABS: Basic Metabolic Panel:  Basename 12/19/11 0555 12/18/11 0540  NA 138 138  K 3.8 4.3  CL 102 104  CO2 24 23  GLUCOSE 98 117*  BUN 25* 26*  CREATININE 1.19 1.11  CALCIUM 8.9 8.8  MG -- --  PHOS -- --   CBC:  Basename 12/19/11 0555 12/18/11 0540  WBC 6.0 5.4  NEUTROABS -- --  HGB 9.5* 9.6*  HCT 30.8* 30.9*  MCV 90.1 90.9  PLT 104* 115*     Current Meds:    . aspirin  81 mg Oral Daily  . bivalirudin      . clopidogrel      . clopidogrel  75 mg Oral Q breakfast  . diazepam      . diazepam  2 mg Oral On Call  . fentaNYL      . furosemide  40 mg Intravenous BID  . heparin      . lidocaine      . midazolam      . nitroGLYCERIN      . sodium chloride  3 mL Intravenous Q12H  . warfarin  7.5 mg Oral ONCE-1800  . Warfarin - Pharmacist Dosing Inpatient   Does not apply q1800  . DISCONTD: aspirin EC  81 mg Oral Daily  . DISCONTD: fentaNYL  250 mcg Intravenous Once  . DISCONTD: furosemide  40 mg Oral Daily  . DISCONTD: midazolam  10 mg Intravenous Once  . DISCONTD: sodium chloride  3 mL Intravenous Q12H     ASSESSMENT AND PLAN:  1. Acute on chronic diastolic CHF secondary to LVH/diastolic dysfunction/aortic stenosis: He has  diuresed well overnight. I think his volume status is up. His wedge was elevated yesterday. Will continue IV Lasix today. Replace potassium.    2. VT arrest - Stable. No recurrence. Likely was related to ischemia. S/p stent SVG to OM2 yesterday.    3. Severe AS - TEE with appearance of severe AS but only moderate by cath yesterday.    4. Acute on chronic renal insufficiency: Resolved. At baseline post cath.   5. PAF on chronic anticoagulation: Rate controlled. Coumadin restarted yesterday.   6. CAD: Stable s/p stent to mid body of SVG to OM2. Continue ASA and Plavix. Will d/c ASA when INR therapeutic and then continue long term on Plavix/coumadin to lower his bleeding risk.   7. Anemia: Stable.     Juan Wolf  6/6/201310:22 AM

## 2011-12-19 NOTE — Progress Notes (Signed)
Report called to RN on 2000---will tx pt via wheelchair---VSS

## 2011-12-19 NOTE — Progress Notes (Signed)
ANTICOAGULATION CONSULT NOTE - Follow Up Consult  Pharmacy Consult for Coumadin Indication: atrial fibrillation  No Known Allergies  Vital Signs: Temp: 97.9 F (36.6 C) (06/06 1200) Temp src: Oral (06/06 1200) BP: 168/66 mmHg (06/06 1200) Pulse Rate: 79  (06/06 0700)  Labs:  Basename 12/19/11 0555 12/18/11 0540 12/17/11 1008 12/17/11 0607  HGB 9.5* 9.6* -- --  HCT 30.8* 30.9* -- 34.3*  PLT 104* 115* -- 125*  APTT -- -- -- --  LABPROT 14.5 14.2 -- --  INR 1.11 1.08 -- --  HEPARINUNFRC -- 0.41 0.47 >2.00*  CREATININE 1.19 1.11 -- 1.10  CKTOTAL -- -- -- --  CKMB -- -- -- --  TROPONINI -- -- -- --    Estimated Creatinine Clearance: 46.5 ml/min (by C-G formula based on Cr of 1.19).  Assessment: 89yom resumed on coumadin 6/5 after his cath. INR subtherapeutic as expected after first dose. Vitamin K 2.5mg  PO x 1 given 5/31 but shouldn't really affect INR at this point. Hgb/Hct/Plt in slow downward trend but stable with no bleeding. Noted plan to continue aspirin until INR therapeutic.  Goal of Therapy:  INR 2-3 Monitor platelets by anticoagulation protocol: Yes   Plan:  1) Repeat coumadin 7.5mg  x 1 2) Follow up INR in AM  Fredrik Rigger 12/19/2011,12:40 PM

## 2011-12-19 NOTE — Progress Notes (Signed)
CARDIAC REHAB PHASE I   PRE:  Rate/Rhythm: 99 Afib    BP: sitting 147/73    SaO2: 99 2L, 92 RA  MODE:  Ambulation: to BR for toileting and cleaning, return to recliner   POST:  Rate/Rhythm: 120 Afib    BP: sitting 168/66     SaO2: 89-85 RA, 95 2L  Came to ambulate pt however he needed to have BM. Used RW to walk to BR, assist x1, on RA. Stated he felt weak after being in bed 1 week. Had BM, cleaned up, then washed face, hands and brushed teeth at sink. Pt exhausted after all this. To recliner. SaO2 initially 89 RA but then down to 85 RA. Reapplied 2L O2 in recliner. 95 2L after few minutes of rest. Pt too tired to ambulate in hall. Discussed stent card, will f/u am. [id number]  [name] CES, ACSM

## 2011-12-20 LAB — BASIC METABOLIC PANEL
BUN: 22 mg/dL (ref 6–23)
Chloride: 104 mEq/L (ref 96–112)
Creatinine, Ser: 1.26 mg/dL (ref 0.50–1.35)
GFR calc Af Amer: 57 mL/min — ABNORMAL LOW (ref >90)
Glucose, Bld: 93 mg/dL (ref 70–99)

## 2011-12-20 LAB — CBC
HCT: 30.6 % — ABNORMAL LOW (ref 39.0–52.0)
MCH: 27.6 pg (ref 26.0–34.0)
MCHC: 30.7 g/dL (ref 30.0–36.0)
MCV: 90 fL (ref 78.0–100.0)
RDW: 17.4 % — ABNORMAL HIGH (ref 11.5–15.5)

## 2011-12-20 LAB — PROTIME-INR: Prothrombin Time: 15.8 seconds — ABNORMAL HIGH (ref 11.6–15.2)

## 2011-12-20 LAB — PRO B NATRIURETIC PEPTIDE: Pro B Natriuretic peptide (BNP): 5208 pg/mL — ABNORMAL HIGH (ref 0–450)

## 2011-12-20 MED ORDER — GABAPENTIN 100 MG PO CAPS
200.0000 mg | ORAL_CAPSULE | Freq: Every day | ORAL | Status: DC
  Administered 2011-12-20: 200 mg via ORAL
  Filled 2011-12-20 (×2): qty 2

## 2011-12-20 MED ORDER — ZOLPIDEM TARTRATE 5 MG PO TABS
5.0000 mg | ORAL_TABLET | Freq: Every day | ORAL | Status: DC
  Administered 2011-12-20: 5 mg via ORAL
  Filled 2011-12-20: qty 1

## 2011-12-20 MED ORDER — WARFARIN SODIUM 7.5 MG PO TABS
7.5000 mg | ORAL_TABLET | Freq: Once | ORAL | Status: AC
  Administered 2011-12-20: 7.5 mg via ORAL
  Filled 2011-12-20: qty 1

## 2011-12-20 MED ORDER — ZOLPIDEM TARTRATE 5 MG PO TABS
5.0000 mg | ORAL_TABLET | Freq: Every evening | ORAL | Status: DC | PRN
  Administered 2011-12-20: 5 mg via ORAL
  Filled 2011-12-20: qty 1

## 2011-12-20 MED FILL — Dextrose Inj 5%: INTRAVENOUS | Qty: 50 | Status: AC

## 2011-12-20 NOTE — Progress Notes (Signed)
ANTICOAGULATION CONSULT NOTE - Follow Up Consult  Pharmacy Consult for Coumadin Indication: Afib  No Known Allergies  Patient Measurements: Height: 5\' 7"  (170.2 cm) Weight: 188 lb 15 oz (85.7 kg) IBW/kg (Calculated) : 66.1  Heparin Dosing Weight:   Vital Signs: Temp: 98.4 F (36.9 C) (06/07 0606) Temp src: Oral (06/07 0606) BP: 148/72 mmHg (06/07 0606)  Labs:  Basename 12/20/11 0545 12/19/11 0555 12/18/11 0540 12/17/11 1008  HGB 9.4* 9.5* -- --  HCT 30.6* 30.8* 30.9* --  PLT 130* 104* 115* --  APTT -- -- -- --  LABPROT 15.8* 14.5 14.2 --  INR 1.23 1.11 1.08 --  HEPARINUNFRC -- -- 0.41 0.47  CREATININE 1.26 1.19 1.11 --  CKTOTAL -- -- -- --  CKMB -- -- -- --  TROPONINI -- -- -- --    Estimated Creatinine Clearance: 43.2 ml/min (by C-G formula based on Cr of 1.26).  Assessment: 89yom on chronic Coumadin for Afib. Coumadin was restarted s/p cath on 6/5. INR (1.23) is subtherapeutic but is beginning to trend up. PTA regimen: 5mg  daily except 7.5mg  MWF. As INR beginning to move, will repeat 7.5mg  dose - if similar INR response tomorrow, will plan to increase to 10mg  tomorrow. - H/H stable, Plts improving - No significant bleeding reported  Goal of Therapy:  INR 2-3   Plan:  1. Repeat Coumadin 7.5mg  po x 1 today 2. Follow-up AM INR  Cleon Dew [phone] 12/20/2011,9:49 AM

## 2011-12-20 NOTE — Evaluation (Signed)
Physical Therapy Evaluation Patient Details Name: Juan Wolf MRN: [id number] DOB: [birth year] Today's Date: 12/20/2011 Time: 4098-1191 PT Time Calculation (min): 29 min  PT Assessment / Plan / Recommendation Clinical Impression  pt adm with progressive SOB, underwent Cath with balloon valvuloplasty.  pt responding well, but deconditioned and mildly weak.  Will defer further PT to HHPT.    PT Assessment  All further PT needs can be met in the next venue of care    Follow Up Recommendations  Home health PT    Barriers to Discharge        lEquipment Recommendations  Rolling walker with 5" wheels    Recommendations for Other Services     Frequency      Precautions / Restrictions Precautions Precautions: Fall   Pertinent Vitals/Pain       Mobility  Bed Mobility Bed Mobility: Not assessed Transfers Transfers: Sit to Stand;Stand to Sit Sit to Stand: 4: Min guard Stand to Sit: 4: Min guard Details for Transfer Assistance: vc's for hand placement and proper use of RW when standing, sitting Ambulation/Gait Ambulation/Gait Assistance: 4: Min guard Ambulation Distance (Feet): 180 Feet Assistive device: Rolling walker Ambulation/Gait Assistance Details: Vc's for safer use of the RW.  Generally stable with RW Gait Pattern: Within Functional Limits Stairs: No    Exercises     PT Diagnosis:    PT Problem List: Decreased strength;Decreased activity tolerance;Decreased balance;Decreased knowledge of use of DME PT Treatment Interventions:     PT Goals    Visit Information  Last PT Received On: 12/20/11 Assistance Needed: +1    Subjective Data  Subjective: yeh, I do pretty well at home alone Patient Stated Goal: Home I   Prior Functioning  Home Living Lives With: Alone Available Help at Discharge: Family;Other (Comment) (son and dtr in law) Type of Home: House Home Access: Stairs to enter [employer] of Steps: 1 Home Layout: One level Bathroom  Shower/Tub: Tub/shower unit Home Adaptive Equipment: Walker - rolling Prior Function Level of Independence: Independent Able to Take Stairs?: Yes Driving: No Communication Communication: No difficulties    Cognition  Overall Cognitive Status: Appears within functional limits for tasks assessed/performed Arousal/Alertness: Awake/alert Orientation Level: Appears intact for tasks assessed Behavior During Session: [facility] for tasks performed    Extremity/Trunk Assessment Right Upper Extremity Assessment RUE ROM/Strength/Tone: [facility] for tasks assessed Left Upper Extremity Assessment LUE ROM/Strength/Tone: [facility] for tasks assessed Right Lower Extremity Assessment RLE ROM/Strength/Tone: [facility] for tasks assessed Left Lower Extremity Assessment LLE ROM/Strength/Tone: [facility] for tasks assessed Trunk Assessment Trunk Assessment: Normal   Balance Balance Balance Assessed: No  End of Session PT - End of Session Activity Tolerance: Patient tolerated treatment well Patient left: in chair;with call bell/phone within reach Nurse Communication: Mobility status   Perry Molla, Eliseo Gum 12/20/2011, 6:21 PM  12/20/2011  Axis Bing, PT [phone] (pager)

## 2011-12-20 NOTE — Progress Notes (Signed)
    SUBJECTIVE: No chest pain. SOB is resolved. Still feels weak.   BP 148/72  Pulse 103  Temp(Src) 98.4 F (36.9 C) (Oral)  Resp 18  Ht 5\' 7"  (1.702 m)  Wt 188 lb 15 oz (85.7 kg)  BMI 29.59 kg/m2  SpO2 91%  Intake/Output Summary (Last 24 hours) at 12/20/11 0914 Last data filed at 12/20/11 0126  Gross per 24 hour  Intake    360 ml  Output   2062 ml  Net  -1702 ml    PHYSICAL EXAM General: Well developed, well nourished, in no acute distress. Alert and oriented x 3.  Psych:  Good affect, responds appropriately Neck: Elevated JVP. No masses noted.  Lungs: Clear bilaterally with no wheezes or rhonci noted.  Heart: RRR with ectopy and loud systolic murmur.  Abdomen: Bowel sounds are present. Soft, non-tender.  Extremities: No lower extremity edema.   LABS: Basic Metabolic Panel:  Basename 12/20/11 0545 12/19/11 0555  NA 142 138  K 4.0 3.8  CL 104 102  CO2 27 24  GLUCOSE 93 98  BUN 22 25*  CREATININE 1.26 1.19  CALCIUM 9.1 8.9  MG -- --  PHOS -- --   CBC:  Basename 12/20/11 0545 12/19/11 0555  WBC 6.9 6.0  NEUTROABS -- --  HGB 9.4* 9.5*  HCT 30.6* 30.8*  MCV 90.0 90.1  PLT 130* 104*    Current Meds:    . aspirin  81 mg Oral Daily  . clopidogrel  75 mg Oral Q breakfast  . furosemide  40 mg Intravenous BID  . metoprolol succinate  50 mg Oral Daily  . potassium chloride  40 mEq Oral Daily  . sodium chloride  3 mL Intravenous Q12H  . warfarin  7.5 mg Oral ONCE-1800  . Warfarin - Pharmacist Dosing Inpatient   Does not apply q1800  . DISCONTD: metoprolol succinate  50 mg Oral Daily   Tele: NSR with PACs.   ASSESSMENT AND PLAN:  1. Acute on chronic diastolic CHF secondary to LVH/diastolic dysfunction/aortic stenosis: he continues to diurese well. He is still mildy volume overloaded. Will continue IV diuresis today. He may be able to be switched to po Lasix tomorrow.   2. VT arrest - Stable. No recurrence. Likely was related to ischemia.  S/p stent SVG  to OM2 12/18/11 per Dr. Excell Seltzer.   3. Severe AS - TEE with appearance of severe AS but only moderate by cath. Will follow.  4. Acute on chronic renal insufficiency: Resolved. At baseline post cath.   5. PAF on chronic anticoagulation: Maintaining NSR. Tikosyn was held on admission. He has been restarted on his Toprol. Coumadin restarted 12/18/11. INR subtherapeutic this am. Will need to have EP comment on long term Tikosyn use.   6. CAD: Stable s/p stent to mid body of SVG to OM2. Continue ASA and Plavix. Will d/c ASA when INR therapeutic and then continue long term on Plavix/coumadin to lower his bleeding risk.   7. Anemia: Stable.  8. Deconditioning: Will ask PT to see today to assess needs at home.   9. Will restart Neurontin and Ambien as per home regimen  10. Dispo: Discharge in next 24-48 hours.     Sullivan Jacuinde  6/7/20139:14 AM

## 2011-12-20 NOTE — Progress Notes (Signed)
CARDIAC REHAB PHASE I   PRE:  Rate/Rhythm: 87 SR    BP: sitting 124/60    SaO2: 94  RA  MODE:  Ambulation: 100 ft   POST:  Rate/Rhythm: 100 ST with irregular bts    BP: sitting 150/70     SaO2: 93 RA  Fairly steady with RW, assist x1. Did not need O2. Fatigues easily. Did not want to "overdo". Has family to watch after him at home off and on. Wants RW for home, which I notified charge RN. Sts his SOB is much improved. Reminded of NTG if needed. Has skin tear right arm, covered with tegaderm. [phone]  [name] CES, ACSM

## 2011-12-20 NOTE — Care Management Note (Signed)
    Page 1 of 2   12/20/2011     3:40:51 PM   CARE MANAGEMENT NOTE 12/20/2011  Patient:  Juan Wolf, Juan Wolf   Account Number:  [id number]  Date Initiated:  12/13/2011  Documentation initiated by:  Junius Creamer  Subjective/Objective Assessment:   adm w v tach, brady     Action/Plan:   lives alone, supp fam, will make sw consutl since weakness   Anticipated DC Date:  12/23/2011   Anticipated DC Plan:  HOME/SELF CARE  In-house referral  Clinical Social Worker      DC Planning Services  CM consult      Choice offered to / List presented to:     DME arranged  Levan Hurst      DME agency  [facility]        Status of service:  In process, will continue to follow Medicare Important Message given?   (If response is "NO", the following Medicare IM given date fields will be blank) Date Medicare IM given:   Date Additional Medicare IM given:    Discharge Disposition:    Per UR Regulation:  Reviewed for med. necessity/level of care/duration of stay  If discussed at Long Length of Stay Meetings, dates discussed:   12/18/2011    Comments:  12-20-11 3:35pm Avie Arenas, RNBSN [phone] Spoke with patient - states family - son and daughter in law are probably going to stay with him.  Brother-in-law also lives next door and assists with his care.  Ordered RW per PT recommendation  - updated patient. Did not feel needed anything else on discharge at this time.  Will continue to follow for further needs.  6/6 9:23a debbie dowell rn,bsn had pci on 6-5, on plavix.  6/4 11:34a debbie dowell rn,bsn spoke w pt. he lives alone, his brother lives next door. pt still drives. he would be interested in hhc if needed at disch. lives in [place]  6/3 13:00 debbie dowell rn,bsn iv hep drip, for cath later in week.  5/31 10:30a debbie dowell rn,bsn [phone] pcp dr vyas.

## 2011-12-21 ENCOUNTER — Encounter (HOSPITAL_COMMUNITY): Payer: Self-pay | Admitting: Physician Assistant

## 2011-12-21 DIAGNOSIS — Z7901 Long term (current) use of anticoagulants: Secondary | ICD-10-CM

## 2011-12-21 DIAGNOSIS — I5033 Acute on chronic diastolic (congestive) heart failure: Secondary | ICD-10-CM | POA: Diagnosis present

## 2011-12-21 DIAGNOSIS — N289 Disorder of kidney and ureter, unspecified: Secondary | ICD-10-CM | POA: Diagnosis present

## 2011-12-21 DIAGNOSIS — D649 Anemia, unspecified: Secondary | ICD-10-CM | POA: Diagnosis present

## 2011-12-21 DIAGNOSIS — I472 Ventricular tachycardia: Secondary | ICD-10-CM | POA: Diagnosis present

## 2011-12-21 LAB — CBC
HCT: 30.2 % — ABNORMAL LOW (ref 39.0–52.0)
Hemoglobin: 9.4 g/dL — ABNORMAL LOW (ref 13.0–17.0)
RDW: 17.2 % — ABNORMAL HIGH (ref 11.5–15.5)
WBC: 6.8 10*3/uL (ref 4.0–10.5)

## 2011-12-21 LAB — BASIC METABOLIC PANEL
BUN: 24 mg/dL — ABNORMAL HIGH (ref 6–23)
CO2: 22 mEq/L (ref 19–32)
Chloride: 101 mEq/L (ref 96–112)
Creatinine, Ser: 1.29 mg/dL (ref 0.50–1.35)
Potassium: 3.6 mEq/L (ref 3.5–5.1)

## 2011-12-21 MED ORDER — WARFARIN SODIUM 5 MG PO TABS: 5.0000 mg | ORAL_TABLET | ORAL | Status: DC

## 2011-12-21 MED ORDER — ASPIRIN EC 81 MG PO TBEC: 81.0000 mg | DELAYED_RELEASE_TABLET | Freq: Every day | ORAL | Status: DC

## 2011-12-21 MED ORDER — POTASSIUM CHLORIDE CRYS ER 20 MEQ PO TBCR: 40.0000 meq | EXTENDED_RELEASE_TABLET | Freq: Every day | ORAL | Status: DC

## 2011-12-21 MED ORDER — CLOPIDOGREL BISULFATE 75 MG PO TABS: 75.0000 mg | ORAL_TABLET | Freq: Every day | ORAL | Status: DC

## 2011-12-21 MED ORDER — WARFARIN SODIUM 7.5 MG PO TABS
7.5000 mg | ORAL_TABLET | Freq: Once | ORAL | Status: AC
  Administered 2011-12-21: 7.5 mg via ORAL
  Filled 2011-12-21: qty 1

## 2011-12-21 MED ORDER — NITROGLYCERIN 0.4 MG SL SUBL: 0.4000 mg | SUBLINGUAL_TABLET | SUBLINGUAL | Status: AC | PRN

## 2011-12-21 NOTE — Discharge Summary (Signed)
Discharge Summary   Patient ID: Juan Wolf MRN: [id number], DOB/AGE: Nov 26, 1921 78 y.o. Admit date: 12/12/2011 D/C date:     12/21/2011   Primary Discharge Diagnoses:  1. CAD  - s/p BMS stent to mid body of SVG to OM2 12/18/2011  - Bare metal stenting, SVG-RCA and SVG-LAD/diagonal graft, 06/2011 - Status post 4-vessel CABG, 1997 - Status post balloon angioplasty, 1987 2. Aortic stenosis, moderately severe by cath  - will need close followup with serial echoes 3. Ventricular tachycardia - possibly ischemic mediated; Tikosyn discontinued 4. Acute on chronic diastolic CHF secondary to LVH/diastolic dysfunction/aortic stenosis 5. CKD , baseline Cr 1.5-2.0  (D/C Cr = 1.29) 6. Anemia 7. Atrial dysrhythmia. - Status post RF ablation of atrial flutter, 2008 - History of atrial fibrillation treated with Tikosyn which was D/C'd this admission in setting of VT - maintained on Coumadin with plan to STOP ASA when INR therapeutic   Secondary Discharge Diagnoses:  1. H/o Pulmonary hypertension 2. COPD 3. HTN 4. Complication of anesthesia ("Doctor told me not to let anyone put me to sleep") 5. GERD 6. Arthritis 7. Umbilical hernia 8. HLD  Hospital Course: 78 year old male, with history of multivessel coronary artery disease, status post remote CABG in 1997, who more recently underwent bare metal stenting of the SVG-RCA and SVG-LAD-diagonal grafts in 06/2011. He has history of normal left ventricular function and moderately severe aortic stenosis, by most recent assessment, with mean gradient 29 mmHg/AVA 0.68 cm squared, during hospitalization for CHF exacerbation at [facility], early this year. The patient was not felt to be a good candidate for valvular surgery given age, prior CABG, comorbidities but the patient was somewhat interested in TAVR. He presented to the office 12/11/11 with complaints of progressive SOB. Admission EKG indicated sinus arrhythmia at 69 BPM with QTc 0.495 seconds.  Initial cardiac markers are negative, with peak troponin 0.05. BNP level was 1100 on admission, with question of chronic interstitial lung disease on chest x-ray. The patient was sent for a chest CT scan for further eval of suspected interstitial lung disease but developed a syncopal episode.He was transferred directly to the intensive care unit where then demonstrated multiple episodes of ventricular tachycardia, some suggestive of torsade de pointes. The patient remained hemodynamically stable, and was initially placed on intravenous amiodarone. This was then switched to intravenous lidocaine, per Dr. Margarita Mail request, given that he felt that this was most likely ischemic-mediated ventricular tachycardia possibly secondary to AS. Stat labs revealed normal electrolytes with potassium 3.9 and magnesium 2.6. Additional cardiac markers remained negative, with a troponin of 0.04. A followup EKG indicated normal sinus rhythm with no acute ischemic changes. The patient was transferred to [facility] for further management. Tikosyn was discontinued. Coumadin was held for tentative cath. 2D echo was obtained demonstrating EF 45-50%, moderate AS, mild AI, mod MR - however Dr. Andee Lineman was not convinced it was moderate AS so ordered TEE. Also of note the patient's renal function, which had bumped, had begun to improve shortly after transfer. He underwent TEE on 12/16/11 which showed a planimeter valve area of 0.4 cm. Cardiac cath was performed 12/18/11 demonstrating: 1. Severe native three-vessel coronary artery disease  2. Status post coronary bypass surgery with continued patency of the saphenous vein graft to LAD and saphenous vein graft to right coronary arteries and severe stenosis of the saphenous vein graft to obtuse marginal with successful PCI  3. Hemodynamic findings consistent with moderately severe aortic stenosis  4. Elevated  elevated right and left intracardiac pressures consistent with congestive heart  failure. The patient was loaded with Plavix and started on daily Plavix. Dr. Excell Seltzer noted this should probably be continued for only 30 days. Dr. Excell Seltzer also recommended serial echo evaluation as an outpatient once he recovers from this hospitalization given progression of AS, but not quite yet severe. IV Lasix was reinitiated for CHF. Coumadin was resumed. He was diuresed over the next several days. He was able to be changed over to PO Lasix today. He is feeling better today and has ambulated well. He is still deconditioned but was able to walk in the hall. The patient was seen and examined today and felt stable for discharge by Dr.  Myrtis Ser.   Per Dr. Henrietta Hoover recommendation, his INR is not yet therapeutic but does not need to be therapeutic before he goes home. He will have early Coumadin follow-up. His Aspirin will need to be stopped when his Coumadin is therapeutic. Dr. Myrtis Ser recommended Lasix 60mg  po BID, Lasix daily. Cr is stable. He will remain off Tikosyn. He will have early hospital follow-up. Pharmacy has recommended to me he go home on Coumadin 7.5mg  daily for now (with INR check on Monday 12/23/11). PT also recommended HHPT so this will be set up at discharge.  Discharge Vitals: Blood pressure 113/63, pulse 92, temperature 97.8 F (36.6 C), temperature source Oral, resp. rate 19, height 5\' 7"  (1.702 m), weight 186 lb 1.6 oz (84.414 kg), SpO2 94.00%.  Labs: Lab Results  Component Value Date   WBC 6.8 12/21/2011   HGB 9.4* 12/21/2011   HCT 30.2* 12/21/2011   MCV 90.1 12/21/2011   PLT 134* 12/21/2011    Lab 12/21/11 0520  NA 137  K 3.6  CL 101  CO2 22  BUN 24*  CREATININE 1.29  CALCIUM 8.9  PROT --  BILITOT --  ALKPHOS --  ALT --  AST --  GLUCOSE 87    Diagnostic Studies/Procedures   1. Chest Port 1 View 12/19/2011  *RADIOLOGY REPORT*  Clinical Data: 78 year old male with cough congestion and CHF, atrial fibrillation.  Volume overload.  PORTABLE CHEST - 1 VIEW  Comparison: 12/12/2011  and earlier.  Findings: Portable seated AP view 1131 hours.  Continued bilateral pleural effusions appear to be small.  Pulmonary vascular congestion is decreased but not resolved.  Mildly increased retrocardiac opacity.  No pneumothorax.  Stable cardiac size and mediastinal contours.  IMPRESSION: 1.  Decreased but not resolved findings of congestive heart failure including vascular congestion and pleural effusions. 2.  Interval increased retrocardiac opacity, favor atelectasis.  Original Report Authenticated By: Harley Hallmark, M.D.   2. 2D Echo 12/13/11 Study Conclusions - Left ventricle: The cavity size was mildly dilated. Wall thickness was increased in a pattern of mild LVH. Systolic function was mildly reduced. The estimated ejection fraction was in the range of 45% to 50%. There is akinesis of the basal-mid inferoposterior myocardium. - Aortic valve: There was moderate stenosis. Mild regurgitation. Valve area: 1.06cm^2(VTI). Valve area: 1.2cm^2 (Vmax). - Mitral valve: Moderate regurgitation. - Left atrium: The atrium was moderately dilated. - Right ventricle: The cavity size was mildly dilated. Systolic function was moderately reduced. - Right atrium: The atrium was mildly dilated. - Pulmonary arteries: Systolic pressure was mildly increased. - Pericardium, extracardiac: There was a left pleural effusion.  Discharge Medications   Medication List  As of 12/21/2011  2:15 PM   STOP taking these medications  dofetilide 125 MCG capsule      dofetilide 250 MCG capsule         TAKE these medications         aspirin EC 81 MG tablet   Take 1 tablet (81 mg total) by mouth daily.      clopidogrel 75 MG tablet   Commonly known as: PLAVIX   Take 1 tablet (75 mg total) by mouth daily with breakfast.      fluticasone 50 MCG/ACT nasal spray   Commonly known as: FLONASE   Place 2 sprays into the nose daily as needed. For allergies      furosemide 40 MG tablet   Commonly known  as: LASIX   Take 60 mg by mouth 2 (two) times daily.      gabapentin 100 MG capsule   Commonly known as: NEURONTIN   Take 200 mg by mouth daily.      metoprolol succinate 50 MG 24 hr tablet   Commonly known as: TOPROL-XL   Take 50 mg by mouth daily. Take with or immediately following a meal.      nitroGLYCERIN 0.4 MG SL tablet   Commonly known as: NITROSTAT   Place 1 tablet (0.4 mg total) under the tongue every 5 (five) minutes as needed. For chest pain. CALL YOUR DOCTOR BEFORE USING.      potassium chloride SA 20 MEQ tablet   Commonly known as: K-DUR,KLOR-CON   Take 2 tablets (40 mEq total) by mouth daily.      warfarin 5 MG tablet   Commonly known as: COUMADIN   Take 1-1.5 tablets (5-7.5 mg total) by mouth as directed. IMPORTANT: Take 1 and a half tablets (7.5mg ) daily today (June 8th) and tomorrow (June 9th). You will then have your INR checked on Monday June 10th to see if your dose needs adjusting.      zolpidem 10 MG tablet   Commonly known as: AMBIEN   Take 10 mg by mouth at bedtime as needed. For sleep            RE: ASPIRIN  - he was instructed on AVS: "VERY IMPORTANT: When you Coumadin level is back to a therapeutic level (INR between 2-3), you will be STOPPING YOUR ASPIRIN. Please ASK THE STAFF IF YOU STILL NEED TO TAKE THIS when you go in for your Coumadin Clinic appointment this week."  Plavix: "VERY IMPORTANT: Talk to your doctor before refilling medicine." A refill was given on the RX to prompt the patient to discuss with Md in case it is desired that this is continued.  Re: NTG - this was ordered PRN in house - he is to call MD before using given h/o AS  Disposition   The patient will be discharged in stable condition to home. Discharge Orders    Future Appointments: Provider: Department: Dept Phone: Center:   12/31/2011 1:45 PM June Leap, MD [facility] [phone] [facility]     Future Orders Please Complete By Expires   Diet - low sodium  heart healthy      Increase activity slowly      Comments:   No driving until cleared by your doctor. No lifting over 5 lbs for 1 week. No sexual activity for 1 week. Keep procedure site clean & dry. If you notice increased pain, swelling, bleeding or pus, call/return!  You may shower, but no soaking baths/hot tubs/pools for 1 week.        Follow-up Information  Follow up with [facility]. (Our office will call you to set up a Coumadin Clinic appointment on Monday - if you have not heard from [place] by noon on Monday, please call office. We will also call you for followup appointment.)    Contact information:   [address] [place] [address]            Duration of Discharge Encounter: Greater than 30 minutes including physician and PA time.  Signed, Ronie Spies PA-C 12/21/2011, 2:15 PM Patient seen and examined. I agree with the assessment and plan as detailed above. See also my additional thoughts below.   See my progress note also. Pt ready to go home.  [name], MD, [facility] 12/22/2011 7:37 AM

## 2011-12-21 NOTE — Progress Notes (Signed)
ANTICOAGULATION CONSULT NOTE - Follow Up Consult  Pharmacy Consult for Coumadin Indication: Afib  No Known Allergies  Labs:  Basename 12/21/11 0520 12/20/11 1045 12/20/11 0545 12/19/11 0555  HGB 9.4* -- 9.4* --  HCT 30.2* -- 30.6* 30.8*  PLT 134* -- 130* 104*  APTT -- -- -- --  LABPROT 17.5* 15.4* 15.8* --  INR 1.41 1.19 1.23 --  HEPARINUNFRC -- -- -- --  CREATININE 1.29 -- 1.26 1.19  CKTOTAL -- -- -- --  CKMB -- -- -- --  TROPONINI -- -- -- --    Estimated Creatinine Clearance: 41.9 ml/min (by C-G formula based on Cr of 1.29).  Assessment: 89yom on chronic Coumadin for Afib. Coumadin was restarted s/p cath on 6/5.   PTA regimen: 5mg  daily except 7.5mg  MWF.   H/H stable, Plts improving No significant bleeding reported  Goal of Therapy:  INR 2-3   Plan:  1. Repeat Coumadin 7.5mg  po x 1 today 2. Follow-up AM INR  Elwin Sleight [phone] 12/21/2011,9:37 AM

## 2011-12-21 NOTE — Progress Notes (Signed)
CARDIAC REHAB PHASE I   PRE:  Rate/Rhythm: 78 SR  BP:  Supine:   Sitting: 122/68  Standing:    SaO2:  MODE:  Ambulation: 50 ft   POST:  Rate/Rhythem: 89  BP:  Supine:   Sitting: 120/60  Standing:    SaO2: 94% RA   1315 - 1335  Pt ambulated 50 with RW and 1 assist tolerated fair, "legs tired".  No c/o cp or sob. Back to chair with feet up and call bell in reach.  Rosalie Doctor

## 2011-12-21 NOTE — Progress Notes (Signed)
Patient ID: Juan Wolf, male   DOB: November 10, 1921, 78 y.o.   MRN: [id number]   SUBJECTIVE:  The patient feels much better. He is ambulated well. He is insistent upon going home. He says that he has good support at home. He's not having any significant shortness of breath. His rhythm is stable.   Filed Vitals:   12/20/11 1755 12/20/11 2201 12/21/11 0444 12/21/11 0550  BP:  132/73 113/63   Pulse:  85 92   Temp:  98.2 F (36.8 C) 97.8 F (36.6 C)   TempSrc:  Oral Oral   Resp:  18 19   Height:      Weight:    186 lb 1.6 oz (84.414 kg)  SpO2: 91% 95% 94%     Intake/Output Summary (Last 24 hours) at 12/21/11 0837 Last data filed at 12/21/11 0550  Gross per 24 hour  Intake    480 ml  Output   1400 ml  Net   -920 ml    LABS: Basic Metabolic Panel:  Basename 12/21/11 0520 12/20/11 0545  NA 137 142  K 3.6 4.0  CL 101 104  CO2 22 27  GLUCOSE 87 93  BUN 24* 22  CREATININE 1.29 1.26  CALCIUM 8.9 9.1  MG -- --  PHOS -- --   Liver Function Tests: No results found for this basename: AST:2,ALT:2,ALKPHOS:2,BILITOT:2,PROT:2,ALBUMIN:2 in the last 72 hours No results found for this basename: LIPASE:2,AMYLASE:2 in the last 72 hours CBC:  Basename 12/21/11 0520 12/20/11 0545  WBC 6.8 6.9  NEUTROABS -- --  HGB 9.4* 9.4*  HCT 30.2* 30.6*  MCV 90.1 90.0  PLT 134* 130*   Cardiac Enzymes: No results found for this basename: CKTOTAL:3,CKMB:3,CKMBINDEX:3,TROPONINI:3 in the last 72 hours BNP: No components found with this basename: POCBNP:3 D-Dimer: No results found for this basename: DDIMER:2 in the last 72 hours Hemoglobin A1C: No results found for this basename: HGBA1C in the last 72 hours Fasting Lipid Panel: No results found for this basename: CHOL,HDL,LDLCALC,TRIG,CHOLHDL,LDLDIRECT in the last 72 hours Thyroid Function Tests: No results found for this basename: TSH,T4TOTAL,FREET3,T3FREE,THYROIDAB in the last 72 hours  RADIOLOGY: Dg Chest Port 1 View  12/19/2011  *RADIOLOGY  REPORT*  Clinical Data: 78 year old male with cough congestion and CHF, atrial fibrillation.  Volume overload.  PORTABLE CHEST - 1 VIEW  Comparison: 12/12/2011 and earlier.  Findings: Portable seated AP view 1131 hours.  Continued bilateral pleural effusions appear to be small.  Pulmonary vascular congestion is decreased but not resolved.  Mildly increased retrocardiac opacity.  No pneumothorax.  Stable cardiac size and mediastinal contours.  IMPRESSION: 1.  Decreased but not resolved findings of congestive heart failure including vascular congestion and pleural effusions. 2.  Interval increased retrocardiac opacity, favor atelectasis.  Original Report Authenticated By: Harley Hallmark, M.D.    PHYSICAL EXAM  Patient is oriented to person time and place. Affect is normal. There is no jugulovenous distention. Lungs reveal a few basilar rales. There is no respiratory distress. Cardiac exam reveals S1 and S2. There is a murmur of his aortic valve disease. His abdomen is soft. There is no peripheral edema.   TELEMETRY:  I have personally reviewed telemetry. There is normal sinus rhythm with PACs.   ASSESSMENT AND PLAN:   WEAKNESS    The patient is deconditioned. However he is feeling much better and he is able to Ambulate in the hall.   CAD (coronary artery disease)    Coronary disease is stable. He is on aspirin,  Plavix and Coumadin. His INR is not yet therapeutic. This does not need to be therapeutic before he goes home. He needs early Coumadin clinic followup. Aspirin can be stopped as soon as his Coumadin is therapeutic so that he is continued on Plavix and Coumadin. He was on Lasix 60 by mouth twice a day at home. In the hospital he's been on 40 mg IV twice a day. He will have received his IV dose this morning. The plan will be for him to go home on 60 mg by mouth twice a day. This should be started with him taking 60 mg by mouth later today.   Aortic stenosis    Patient has significant aortic  stenosis. This is being followed medically at this time.His aortic stenosis was severe by TEE. However it was felt to be moderate in the cath lab.   Acute on chronic diastolic CHF (congestive heart failure), NYHA class 1    Patient is diaries well. He is feeling much better. He does have a few basilar rales. I've consider the possibility of keeping her in the hospital because of this. He says he's walking well and feels good and he wants to go home. His home weights need to be followed very carefully.   Ventricular tachycardia    Patient initially presented with a VT arrest. He had been on Tikosyn in the past. This was discontinued this hospitalization. The patient asked me about this today. I made it clear to him that he would not go home on Tikosyn. The patient did receive a stent to the SVG to the OM this admission. His original arrhythmia may well have been related to ischemia.   Renal insufficiency   Creatinine today is stable at 1.29. This is better than his creatinine on admission.   Warfarin anticoagulation    Coumadin is to be continued. He needs early followup in the Coumadin clinic.   Anemia   Hemoglobin today is stable at 9.4.  Hypokalemia   His potassium is 3.6 this morning. It was actually high when he first came in. He is now receiving 40 mEq of potassium daily. We will plan for him to receive his morning dose of potassium today and then give him an extra 40 mEq of potassium that he should take later today. Then he can be continued on 40 mEq a day. He needs early followup chemistry labs as an outpatient to be sure that his renal function and potassium are stable.     [name] 12/21/2011 8:37 AM

## 2011-12-21 NOTE — Progress Notes (Signed)
Pt. Discharged 12/21/2011  5:34 PM Discharge instructions reviewed with patient/family. Patient/family verbalized understanding. All Rx's given. Questions answered as needed. Pt. Discharged to home with family/self.  Juan Wolf

## 2011-12-24 ENCOUNTER — Ambulatory Visit (INDEPENDENT_AMBULATORY_CARE_PROVIDER_SITE_OTHER): Payer: Medicare Other | Admitting: *Deleted

## 2011-12-24 DIAGNOSIS — I4891 Unspecified atrial fibrillation: Secondary | ICD-10-CM

## 2011-12-24 DIAGNOSIS — Z7901 Long term (current) use of anticoagulants: Secondary | ICD-10-CM

## 2011-12-24 DIAGNOSIS — I4892 Unspecified atrial flutter: Secondary | ICD-10-CM

## 2011-12-24 LAB — POCT INR: INR: 1.9

## 2011-12-25 ENCOUNTER — Other Ambulatory Visit: Payer: Self-pay | Admitting: *Deleted

## 2011-12-25 DIAGNOSIS — Z79899 Other long term (current) drug therapy: Secondary | ICD-10-CM

## 2011-12-25 DIAGNOSIS — N189 Chronic kidney disease, unspecified: Secondary | ICD-10-CM

## 2011-12-31 ENCOUNTER — Telehealth: Payer: Self-pay | Admitting: Cardiology

## 2011-12-31 ENCOUNTER — Encounter: Payer: Self-pay | Admitting: Cardiology

## 2011-12-31 ENCOUNTER — Other Ambulatory Visit (INDEPENDENT_AMBULATORY_CARE_PROVIDER_SITE_OTHER): Payer: Medicare Other | Admitting: Cardiovascular Disease

## 2011-12-31 ENCOUNTER — Ambulatory Visit (INDEPENDENT_AMBULATORY_CARE_PROVIDER_SITE_OTHER): Payer: Medicare Other | Admitting: *Deleted

## 2011-12-31 ENCOUNTER — Ambulatory Visit (INDEPENDENT_AMBULATORY_CARE_PROVIDER_SITE_OTHER): Payer: Medicare Other | Admitting: Cardiology

## 2011-12-31 ENCOUNTER — Other Ambulatory Visit: Payer: Self-pay | Admitting: Cardiology

## 2011-12-31 VITALS — BP 126/70 | HR 80 | Ht 67.0 in | Wt 192.8 lb

## 2011-12-31 DIAGNOSIS — I4892 Unspecified atrial flutter: Secondary | ICD-10-CM

## 2011-12-31 DIAGNOSIS — Z7901 Long term (current) use of anticoagulants: Secondary | ICD-10-CM

## 2011-12-31 DIAGNOSIS — I359 Nonrheumatic aortic valve disorder, unspecified: Secondary | ICD-10-CM

## 2011-12-31 DIAGNOSIS — I350 Nonrheumatic aortic (valve) stenosis: Secondary | ICD-10-CM

## 2011-12-31 DIAGNOSIS — I4891 Unspecified atrial fibrillation: Secondary | ICD-10-CM

## 2011-12-31 DIAGNOSIS — I472 Ventricular tachycardia, unspecified: Secondary | ICD-10-CM

## 2011-12-31 DIAGNOSIS — N289 Disorder of kidney and ureter, unspecified: Secondary | ICD-10-CM

## 2011-12-31 DIAGNOSIS — I2510 Atherosclerotic heart disease of native coronary artery without angina pectoris: Secondary | ICD-10-CM

## 2011-12-31 DIAGNOSIS — Z9861 Coronary angioplasty status: Secondary | ICD-10-CM

## 2011-12-31 LAB — POCT INR: INR: 2.6

## 2011-12-31 MED ORDER — CLOPIDOGREL BISULFATE 75 MG PO TABS: 75.0000 mg | ORAL_TABLET | Freq: Every day | ORAL | Status: DC

## 2011-12-31 MED ORDER — FUROSEMIDE 40 MG PO TABS: 60.0000 mg | ORAL_TABLET | Freq: Every day | ORAL | Status: DC

## 2011-12-31 NOTE — Assessment & Plan Note (Signed)
Stable normal sinus rhythm 

## 2011-12-31 NOTE — Progress Notes (Signed)
Juan Bottoms, MD, [facility]IM Board Certified in Adult Cardiovascular Medicine,Internal Medicine and Critical Care Medicine    CC: Followup after recent episode of polymorphic ventricular tachycardia secondary to ischemia, status post bare-metal stent SVG to OM.  HPI:  The patient states that he is much improved. He has no recurrent chest pain. He stated the shortness of breath is much improved. He denies any orthopnea PND he reports no palpitations presyncope or syncope. Dofetilide has been discontinued and the patient has had no recurrent atrial fibrillation. He does have a prior history of atrial flutter ablation. The patient reports that his leg still gets weak but once his walking he feels stronger and better. He denies any claudication. Otherwise he stable from a cardiovascular perspective and reports no bleeding complication on triple therapy with aspirin, Plavix and Coumadin.  PMH: reviewed and listed in Problem List in Electronic Records (and see below) Past Medical History  Diagnosis Date  . Umbilical hernia without mention of obstruction or gangrene   . Other and unspecified hyperlipidemia   . COPD (chronic obstructive pulmonary disease)   . Hypertension   . Cramp of limb   . Atrial flutter     s/p RFCA   . Atrial fibrillation     tikosyn rx d/c in setting of VT (possibly ischemia mediated though)  . CAD (coronary artery disease)     a) Status post balloon angioplasty, 1987, b) 4-vessel CABG, 1997, c) Bare metal stenting, SVG-RCA and SVG-LAD/diagonal graft, 06/2011, d) BMS stent to mid body of SVG to OM2 12/18/2011   . Aortic stenosis 06/05/2011  . Chronic kidney disease     CKD  . Complication of anesthesia     " The doctor told me not to let anyone put me to sleep"  . Myocardial infarction   . GERD (gastroesophageal reflux disease)   . Arthritis   . Chronic diastolic heart failure   . Ventricular tachycardia     12/2011  . Pulmonary HTN    Past Surgical History    Procedure Date  . Appendectomy   . Back surgery   . Cholecystectomy   . Hernia repair   . Cardiac catheterization 06/19/2011    Moderate pulmonary hypertension, moderate AS (valve area: 1.05), severe 3 vessel CAD. 90% stensis in both SVG to LAD/diagonal and SVG to RCA. Patent SVG to OM.   Marland Kitchen Coronary angioplasty 06/19/2011    BMS to both SVG to RCA and SVG to LAD/diagonal  . Coronary artery bypass graft     Allergies/SH/FHX : available in Electronic Records for review  No Known Allergies History   Social History  . Marital Status: Married    Spouse Name: N/A    Number of Children: N/A  . Years of Education: N/A   Occupational History  . Not on file.   Social History Main Topics  . Smoking status: Former Smoker -- 0.3 packs/day for 55 years    Types: Cigars    Quit date: 07/16/1995  . Smokeless tobacco: Former Neurosurgeon    Types: Chew    Quit date: 07/15/1944   Comment: chewed tobacoo for 3 years  . Alcohol Use: No  . Drug Use: No  . Sexually Active: No   Other Topics Concern  . Not on file   Social History Narrative  . No narrative on file   No family history on file.  Medications: Current Outpatient Prescriptions  Medication Sig Dispense Refill  . aspirin EC 81 MG tablet  Take 1 tablet (81 mg total) by mouth daily.      . clopidogrel (PLAVIX) 75 MG tablet Take 1 tablet (75 mg total) by mouth daily with breakfast.  30 tablet  1  . fluticasone (FLONASE) 50 MCG/ACT nasal spray Place 2 sprays into the nose daily as needed. For allergies      . furosemide (LASIX) 40 MG tablet Take 1.5 tablets (60 mg total) by mouth daily.      Marland Kitchen gabapentin (NEURONTIN) 100 MG capsule Take 200 mg by mouth daily.       . metoprolol succinate (TOPROL-XL) 50 MG 24 hr tablet Take 50 mg by mouth daily. Take with or immediately following a meal.      . nitroGLYCERIN (NITROSTAT) 0.4 MG SL tablet Place 1 tablet (0.4 mg total) under the tongue every 5 (five) minutes as needed. For chest pain. CALL  YOUR DOCTOR BEFORE USING.      Marland Kitchen potassium chloride SA (K-DUR,KLOR-CON) 20 MEQ tablet Take 2 tablets (40 mEq total) by mouth daily.      Marland Kitchen warfarin (COUMADIN) 5 MG tablet Take 1-1.5 tablets (5-7.5 mg total) by mouth as directed. IMPORTANT: Take 1 and a half tablets (7.5mg ) daily today (June 8th) and tomorrow (June 9th). You will then have your INR checked on Monday June 10th to see if your dose needs adjusting.      Marland Kitchen zolpidem (AMBIEN) 10 MG tablet Take 10 mg by mouth at bedtime as needed. For sleep       . DISCONTD: furosemide (LASIX) 40 MG tablet Take 60 mg by mouth 2 (two) times daily.      Marland Kitchen DISCONTD: lisinopril (PRINIVIL,ZESTRIL) 10 MG tablet Take 1 tablet (10 mg total) by mouth 2 (two) times daily.  60 tablet  11    ROS: No nausea or vomiting. No fever or chills.No melena or hematochezia.No bleeding.No claudication  Physical Exam: BP 126/70  Pulse 80  Ht 5\' 7"  (1.702 m)  Wt 192 lb 12.8 oz (87.454 kg)  BMI 30.20 kg/m2 General: Well-nourished white male in no distress. Neck: Normal carotid upstroke left carotid bruit. No thyromegaly nonnodular thyroid. Lungs: Clear breath sounds bilaterally without wheezing. Cardiac: Regular rate and rhythm with normal S1-S2 and a 2/6 crescendo decrescendo murmur right upper sternal border Vascular: No edema. Normal distal pulses Skin: Warm and dry Physcologic: Normal affect  12lead ECG: Singley tracing shows normal sinus rhythm. Limited bedside ECHO:N/A No images are attached to the encounter.   Assessment and Plan  Ventricular tachycardia Status post ischemic polymorphic ventricular tachycardia associated cardiac arrest requiring CPR. Dofetilide also discontinued in the interim.  Renal insufficiency Creatinine stable. Baseline creatinine 1.5 2.0. On recent discharge creatinine 1.29.  Aortic stenosis Patient has moderately severe aortic stenosis. This will be closely monitored period I've ordered an echocardiogram in one month. The patient  may be a future candidate for TAVR  CAD (coronary artery disease) Status post recent cardiac catheterization after ischemic ventricular tachycardia. Patient was found to have stenosis of the mid body of the saphenous vein graft to second obtuse marginal branch. He received a bare-metal stent on 12/18/2011. He was placed on aspirin and Plavix. Of note is that the patient is also on Coumadin for history of atrial fibrillation. Plavix will be continued until July 5 and then can be DC'd and patient can continue aspirin and Coumadin.  Atrial fibrillation Stable normal sinus rhythm  ATRIAL FLUTTER Stable normal sinus rhythm.   Patient Active Problem List  Diagnosis  .  DYSLIPIDEMIA  . HYPERTENSION  . ATRIAL FLUTTER  . COPD  . UMBILICAL HERNIA  . LEG CRAMPS  . WEAKNESS  . DYSPNEA  . CAD (coronary artery disease)  . Murmur  . Aortic stenosis  . Chronic diastolic heart failure  . Acute on chronic diastolic CHF (congestive heart failure), NYHA class 1  . Ventricular tachycardia  . Renal insufficiency  . Warfarin anticoagulation  . Anemia  . Atrial fibrillation

## 2011-12-31 NOTE — Telephone Encounter (Signed)
2 D ECHO scheduled for 01/02/2012 at [facility]

## 2011-12-31 NOTE — Assessment & Plan Note (Signed)
Patient has moderately severe aortic stenosis. This will be closely monitored period I've ordered an echocardiogram in one month. The patient may be a future candidate for TAVR

## 2011-12-31 NOTE — Assessment & Plan Note (Signed)
Creatinine stable. Baseline creatinine 1.5 2.0. On recent discharge creatinine 1.29.

## 2011-12-31 NOTE — Assessment & Plan Note (Signed)
Status post ischemic polymorphic ventricular tachycardia associated cardiac arrest requiring CPR. Dofetilide also discontinued in the interim.

## 2011-12-31 NOTE — Patient Instructions (Addendum)
   Echo  Labs (can be done same day as Echo) - BMET & BNP  Our office will contact with results.  Decrease Lasix to 60mg  daily  Stop Plavix July 5  Continue Aspirin 81mg  daily Follow up in  3 months

## 2011-12-31 NOTE — Assessment & Plan Note (Signed)
Stable normal sinus rhythm

## 2011-12-31 NOTE — Assessment & Plan Note (Signed)
Status post recent cardiac catheterization after ischemic ventricular tachycardia. Patient was found to have stenosis of the mid body of the saphenous vein graft to second obtuse marginal branch. He received a bare-metal stent on 12/18/2011. He was placed on aspirin and Plavix. Of note is that the patient is also on Coumadin for history of atrial fibrillation. Plavix will be continued until July 5 and then can be DC'd and patient can continue aspirin and Coumadin.

## 2012-01-01 NOTE — Telephone Encounter (Signed)
Auth# [id number] exp 02/15/12  I spoke with Marylene Land and she is aware of this one.

## 2012-01-02 DIAGNOSIS — I359 Nonrheumatic aortic valve disorder, unspecified: Secondary | ICD-10-CM

## 2012-01-08 ENCOUNTER — Telehealth: Payer: Self-pay | Admitting: *Deleted

## 2012-01-08 DIAGNOSIS — R609 Edema, unspecified: Secondary | ICD-10-CM

## 2012-01-08 DIAGNOSIS — R7989 Other specified abnormal findings of blood chemistry: Secondary | ICD-10-CM

## 2012-01-08 DIAGNOSIS — R0602 Shortness of breath: Secondary | ICD-10-CM

## 2012-01-08 NOTE — Telephone Encounter (Signed)
Notes Recorded by Lesle Chris, LPN on 4/54/0981 at 2:13 PM Patient notified and verbalized understanding. Has coumadin check on Friday, 6/28. Will give lab order at that time.

## 2012-01-08 NOTE — Telephone Encounter (Signed)
Message copied by Lesle Chris on Wed Jan 08, 2012  2:13 PM ------      Message from: Lewayne Bunting E      Created: Wed Jan 08, 2012  8:03 AM       Lab stable but significantly elevated BNP level.Increase Lasix to 60 mg p.o. B.i.d. x3 days then resume 60 mg p.o. Q. Daily followup the BMET in one month as well as a BNP level.

## 2012-01-10 ENCOUNTER — Ambulatory Visit (INDEPENDENT_AMBULATORY_CARE_PROVIDER_SITE_OTHER): Payer: Medicare Other | Admitting: *Deleted

## 2012-01-10 DIAGNOSIS — I4892 Unspecified atrial flutter: Secondary | ICD-10-CM

## 2012-01-31 ENCOUNTER — Ambulatory Visit (INDEPENDENT_AMBULATORY_CARE_PROVIDER_SITE_OTHER): Payer: Medicare Other | Admitting: *Deleted

## 2012-01-31 DIAGNOSIS — Z7901 Long term (current) use of anticoagulants: Secondary | ICD-10-CM

## 2012-01-31 DIAGNOSIS — I4891 Unspecified atrial fibrillation: Secondary | ICD-10-CM

## 2012-01-31 DIAGNOSIS — I4892 Unspecified atrial flutter: Secondary | ICD-10-CM

## 2012-02-14 ENCOUNTER — Encounter: Payer: Self-pay | Admitting: Physician Assistant

## 2012-02-14 ENCOUNTER — Other Ambulatory Visit: Payer: Self-pay

## 2012-02-14 DIAGNOSIS — I509 Heart failure, unspecified: Secondary | ICD-10-CM

## 2012-02-17 DIAGNOSIS — I5030 Unspecified diastolic (congestive) heart failure: Secondary | ICD-10-CM

## 2012-02-29 DIAGNOSIS — I509 Heart failure, unspecified: Secondary | ICD-10-CM

## 2012-03-01 DIAGNOSIS — R7989 Other specified abnormal findings of blood chemistry: Secondary | ICD-10-CM

## 2012-03-02 DIAGNOSIS — I4891 Unspecified atrial fibrillation: Secondary | ICD-10-CM

## 2012-03-02 DIAGNOSIS — I5023 Acute on chronic systolic (congestive) heart failure: Secondary | ICD-10-CM

## 2012-03-03 DIAGNOSIS — I059 Rheumatic mitral valve disease, unspecified: Secondary | ICD-10-CM

## 2012-03-04 DIAGNOSIS — I509 Heart failure, unspecified: Secondary | ICD-10-CM

## 2012-03-24 ENCOUNTER — Telehealth: Payer: Self-pay | Admitting: Cardiology

## 2012-03-24 NOTE — Telephone Encounter (Signed)
Called patient to reschedule appointment.  Will reschedule appointment with another provider.  Requested patient to call us to confirm appointment change.  Sending reminder letter to patient as well.

## 2012-04-02 ENCOUNTER — Ambulatory Visit (INDEPENDENT_AMBULATORY_CARE_PROVIDER_SITE_OTHER): Payer: Medicare Other | Admitting: Cardiovascular Disease

## 2012-04-02 ENCOUNTER — Ambulatory Visit: Payer: Medicare Other | Admitting: Cardiology

## 2012-04-02 ENCOUNTER — Encounter: Payer: Self-pay | Admitting: Cardiovascular Disease

## 2012-04-02 VITALS — BP 130/68 | HR 85 | Ht 67.0 in | Wt 194.0 lb

## 2012-04-02 DIAGNOSIS — I4891 Unspecified atrial fibrillation: Secondary | ICD-10-CM

## 2012-04-02 DIAGNOSIS — Z79899 Other long term (current) drug therapy: Secondary | ICD-10-CM

## 2012-04-02 DIAGNOSIS — I5032 Chronic diastolic (congestive) heart failure: Secondary | ICD-10-CM

## 2012-04-02 DIAGNOSIS — I350 Nonrheumatic aortic (valve) stenosis: Secondary | ICD-10-CM

## 2012-04-02 DIAGNOSIS — I5022 Chronic systolic (congestive) heart failure: Secondary | ICD-10-CM

## 2012-04-02 DIAGNOSIS — I509 Heart failure, unspecified: Secondary | ICD-10-CM

## 2012-04-02 DIAGNOSIS — I359 Nonrheumatic aortic valve disorder, unspecified: Secondary | ICD-10-CM

## 2012-04-02 DIAGNOSIS — R0600 Dyspnea, unspecified: Secondary | ICD-10-CM

## 2012-04-02 NOTE — Patient Instructions (Addendum)
   Lab - today - CBC, BMET, BNP   Office will contact with results Continue all current medications. Follow up in  2 months

## 2012-04-02 NOTE — Assessment & Plan Note (Signed)
The patient appears to be mildly fluid overloaded by history and physical exam. I'm hesitant to increase his diuretics before knowing his labs especially with his recent admission for severe anemia. He also has chronic kidney disease. He is currently taking Lasix 60 mg in the morning and 40 mg in the afternoon. I will check routine labs today including CBC, basic metabolic profile and BNP. If his renal function is stable, I will increase the dose of Lasix.

## 2012-04-02 NOTE — Assessment & Plan Note (Signed)
Moderate to severe. Continue to monitor. He is not a candidate for aVR. TAVR can be considered in the future if his other comorbidities a reasonably controlled.

## 2012-04-02 NOTE — Progress Notes (Signed)
HPI  Juan Wolf is an 78 year old male who is well known to me. He has known history of coronary artery disease status post CABG and multiple PCI on the engrafts most recently in May of 2013. He also has moderate to severe aortic stenosis, systolic heart failure with mildly reduced LV systolic function as well as atrial fibrillation. In May of this year he presented with ventricular tachycardia that required CPR as well as non-ST elevation myocardial infarction. Cardiac catheterization showed patent stents and SVG to RCA and SVG to LAD. He was noted to have a new stenosis in SVG to OM. He had a bare-metal stent placement. He was on triple therapy with aspirin, Plavix and warfarin. He was hospitalized in early August with acute on chronic systolic heart failure. He was hospitalized another time in August with GI bleeding which required transfusion and stopping all his antiplatelet therapy and anticoagulation. The patient reports gradual increase in dyspnea without chest pain. He is able to walk with a walker.  Allergies  Allergen Reactions  . Contrast Media (Iodinated Diagnostic Agents)     Using during MRI     Current Outpatient Prescriptions on File Prior to Visit  Medication Sig Dispense Refill  . fluticasone (FLONASE) 50 MCG/ACT nasal spray Place 2 sprays into the nose daily as needed. For allergies      . furosemide (LASIX) 40 MG tablet Take 1.5 tablets (60 mg total) by mouth daily.      Marland Kitchen gabapentin (NEURONTIN) 100 MG capsule Take 200 mg by mouth daily.       . metoprolol succinate (TOPROL-XL) 50 MG 24 hr tablet Take 50 mg by mouth daily. Take with or immediately following a meal.      . nitroGLYCERIN (NITROSTAT) 0.4 MG SL tablet Place 1 tablet (0.4 mg total) under the tongue every 5 (five) minutes as needed. For chest pain. CALL YOUR DOCTOR BEFORE USING.      . pantoprazole (PROTONIX) 40 MG tablet Take 40 mg by mouth daily.      . potassium chloride SA (K-DUR,KLOR-CON) 20 MEQ tablet  Take 2 tablets (40 mEq total) by mouth daily.      Marland Kitchen zolpidem (AMBIEN) 10 MG tablet Take 10 mg by mouth at bedtime as needed. For sleep       . DISCONTD: lisinopril (PRINIVIL,ZESTRIL) 10 MG tablet Take 1 tablet (10 mg total) by mouth 2 (two) times daily.  60 tablet  11     Past Medical History  Diagnosis Date  . Umbilical hernia without mention of obstruction or gangrene   . Other and unspecified hyperlipidemia   . COPD (chronic obstructive pulmonary disease)   . Hypertension   . Cramp of limb   . Atrial flutter     s/p RFCA   . Atrial fibrillation     tikosyn rx d/c in setting of VT (possibly ischemia mediated though)  . CAD (coronary artery disease)     a) Status post balloon angioplasty, 1987, b) 4-vessel CABG, 1997, c) Bare metal stenting, SVG-RCA and SVG-LAD/diagonal graft, 06/2011, d) BMS stent to mid body of SVG to OM2 12/18/2011   . Aortic stenosis 06/05/2011  . Chronic kidney disease     CKD  . Complication of anesthesia     " The doctor told me not to let anyone put me to sleep"  . Myocardial infarction   . GERD (gastroesophageal reflux disease)   . Arthritis   . Chronic diastolic heart failure   .  Ventricular tachycardia     12/2011  . Pulmonary HTN      Past Surgical History  Procedure Date  . Appendectomy   . Back surgery   . Cholecystectomy   . Hernia repair   . Cardiac catheterization 06/19/2011    Moderate pulmonary hypertension, moderate AS (valve area: 1.05), severe 3 vessel CAD. 90% stensis in both SVG to LAD/diagonal and SVG to RCA. Patent SVG to OM.   Marland Kitchen Coronary angioplasty 06/19/2011    BMS to both SVG to RCA and SVG to LAD/diagonal  . Coronary artery bypass graft      No family history on file.   History   Social History  . Marital Status: Married    Spouse Name: N/A    Number of Children: N/A  . Years of Education: N/A   Occupational History  . Not on file.   Social History Main Topics  . Smoking status: Former Smoker -- 0.3  packs/day for 55 years    Types: Cigars    Quit date: 07/16/1995  . Smokeless tobacco: Former Neurosurgeon    Types: Chew    Quit date: 07/15/1944   Comment: chewed tobacoo for 3 years  . Alcohol Use: No  . Drug Use: No  . Sexually Active: No   Other Topics Concern  . Not on file   Social History Narrative  . No narrative on file     PHYSICAL EXAM   BP 130/68  Pulse 85  Ht 5\' 7"  (1.702 m)  Wt 194 lb (87.998 kg)  BMI 30.38 kg/m2 Constitutional: He is oriented to person, place, and time. He appears well-developed and well-nourished. No distress.  HENT: No nasal discharge.  Head: Normocephalic and atraumatic.  Eyes: Pupils are equal and round. Right eye exhibits no discharge. Left eye exhibits no discharge.  Neck: Normal range of motion. Neck supple. Mild JVD present. No thyromegaly present.  Cardiovascular: Normal rate, regular rhythm, S2 is diminished. Exam reveals no gallop and no friction rub. There is a 3/6 systolic ejection murmur at the aortic area which is late peaking.  Pulmonary/Chest: Effort normal and breath sounds normal. No stridor. No respiratory distress. He has no wheezes. He does have bibasilar crackles. Abdominal: Soft. Bowel sounds are normal. He exhibits no distension. There is no tenderness. There is no rebound and no guarding.  Musculoskeletal: Normal range of motion. He exhibits +1 edema and no tenderness.  Neurological: He is alert and oriented to person, place, and time. Coordination normal.  Skin: Skin is warm and dry. No rash noted. He is not diaphoretic. No erythema. No pallor.  Psychiatric: He has a normal mood and affect. His behavior is normal. Judgment and thought content normal.       EKG: atrial fibrillation  -Old anterior infarct.   ABNORMAL    ASSESSMENT AND PLAN

## 2012-04-02 NOTE — Assessment & Plan Note (Signed)
The patient is no longer on dofetilide. Continue treatment with metoprolol. Continue rate control for now especially that he is off anticoagulation. He is at significant risk for thromboembolic complications. Thus, I asked him to followup with Dr. Teena Dunk to complete his GI evaluation and see when it is safe to resume anticoagulation. His GI bleed happened in the setting of being on triple therapy. Resuming warfarin alone without any antiplatelet agents Might be the best option to do and any of future. He had a bare-metal stent placed and not a drug-eluting stent.

## 2012-04-06 ENCOUNTER — Telehealth: Payer: Self-pay | Admitting: *Deleted

## 2012-04-06 DIAGNOSIS — N289 Disorder of kidney and ureter, unspecified: Secondary | ICD-10-CM

## 2012-04-06 DIAGNOSIS — Z79899 Other long term (current) drug therapy: Secondary | ICD-10-CM

## 2012-04-06 DIAGNOSIS — I10 Essential (primary) hypertension: Secondary | ICD-10-CM

## 2012-04-06 MED ORDER — FUROSEMIDE 40 MG PO TABS: 60.0000 mg | ORAL_TABLET | Freq: Two times a day (BID) | ORAL | Status: DC

## 2012-04-06 NOTE — Telephone Encounter (Signed)
Notes Recorded by Lesle Chris, LPN on 8/65/7846 at 11:38 AM Patient notified. Will fax order to [facility] today. He will do around 10/1.   Notes Recorded by Lesle Chris, LPN on 9/62/9528 at 9:50 AM Left message to return call.

## 2012-04-06 NOTE — Telephone Encounter (Signed)
Message copied by Lesle Chris on Mon Apr 06, 2012 11:39 AM ------      Message from: [name] A      Created: Fri Apr 03, 2012  3:57 PM       Labs are ok. Stable renal function. Increase Lasix to 60 mg bid. Recheck BMP in 1 week.

## 2012-04-20 ENCOUNTER — Telehealth: Payer: Self-pay | Admitting: *Deleted

## 2012-04-20 NOTE — Telephone Encounter (Signed)
Message copied by Lesle Chris on Mon Apr 20, 2012  9:54 AM ------      Message from: Eustace Moore      Created: Fri Apr 17, 2012  4:26 PM                   ----- Message -----         From: Iran Ouch, MD         Sent: 04/17/2012   1:53 PM           To: Eustace Moore, LPN, Lesle Chris, LPN            Renal function is stable. Continue same dose of Lasix. Potassium was slightly low. Make sure he is taking Potasium pills as prescribed.

## 2012-04-20 NOTE — Telephone Encounter (Signed)
Notes Recorded by Lesle Chris, LPN on 16/07/958 at 9:54 AM Patient notified. Notes Recorded by Lesle Chris, LPN on 45/10/979 at 9:39 AM Left message to return call.

## 2012-05-22 ENCOUNTER — Other Ambulatory Visit: Payer: Self-pay | Admitting: Cardiology

## 2012-05-22 ENCOUNTER — Encounter: Payer: Self-pay | Admitting: Physician Assistant

## 2012-05-24 DIAGNOSIS — I509 Heart failure, unspecified: Secondary | ICD-10-CM

## 2012-05-25 DIAGNOSIS — I359 Nonrheumatic aortic valve disorder, unspecified: Secondary | ICD-10-CM

## 2012-06-01 ENCOUNTER — Other Ambulatory Visit (HOSPITAL_COMMUNITY): Payer: Self-pay | Admitting: Physician Assistant

## 2012-06-04 ENCOUNTER — Encounter: Payer: Self-pay | Admitting: Physician Assistant

## 2012-06-04 ENCOUNTER — Ambulatory Visit (INDEPENDENT_AMBULATORY_CARE_PROVIDER_SITE_OTHER): Payer: Medicare Other | Admitting: Physician Assistant

## 2012-06-04 ENCOUNTER — Other Ambulatory Visit: Payer: Self-pay | Admitting: Physician Assistant

## 2012-06-04 VITALS — BP 146/78 | HR 108 | Ht 67.0 in | Wt 185.0 lb

## 2012-06-04 DIAGNOSIS — Z79899 Other long term (current) drug therapy: Secondary | ICD-10-CM

## 2012-06-04 DIAGNOSIS — I509 Heart failure, unspecified: Secondary | ICD-10-CM

## 2012-06-04 DIAGNOSIS — I5022 Chronic systolic (congestive) heart failure: Secondary | ICD-10-CM

## 2012-06-04 DIAGNOSIS — I350 Nonrheumatic aortic (valve) stenosis: Secondary | ICD-10-CM

## 2012-06-04 DIAGNOSIS — I4891 Unspecified atrial fibrillation: Secondary | ICD-10-CM

## 2012-06-04 DIAGNOSIS — I359 Nonrheumatic aortic valve disorder, unspecified: Secondary | ICD-10-CM

## 2012-06-04 MED ORDER — HYDROCHLOROTHIAZIDE 12.5 MG PO CAPS: 12.5000 mg | ORAL_CAPSULE | Freq: Every day | ORAL | Status: DC

## 2012-06-04 MED ORDER — METOPROLOL TARTRATE 50 MG PO TABS: 50.0000 mg | ORAL_TABLET | Freq: Two times a day (BID) | ORAL | Status: DC

## 2012-06-04 NOTE — Assessment & Plan Note (Addendum)
Patient was discharged off beta blocker, and presents with elevated pulse reading. We'll resume Lopressor at 50 mg twice a day. As previously noted by Dr. Kirke Corin, patient is at increased risk for thromboembolic stroke. However, he has history of severe GI bleeding, requiring transfusion and discontinuation of all antiplatelet/anticoagulation therapy. Patient reports today that he did have an upper endoscopy, but did not precede with a recommended colonoscopy, because he was too weak. Review of prior records indicates that he had evidence of moderate gastritis, back in August. Patient will need to return to Dr. Teena Dunk, once he is more stable from a cardiac standpoint. In the meanwhile, we will continue monitoring closely off ASA or anticoagulant. Once his GI evaluation is completed, recommendation is that he resume Coumadin anticoagulation, without concomitant ASA, given that he was previously treated with a BMS and not a DES for his CAD, back in May.

## 2012-06-04 NOTE — Assessment & Plan Note (Signed)
Assessed as moderately severe by echocardiogram, 12/2011. Patient previously deemed not a suitable candidate for AVR, but a possible candidate for TAVR, pending improvement in his clinical status.

## 2012-06-04 NOTE — Progress Notes (Signed)
Primary Cardiologist: Mady Gemma, MD   HPI: Post hospital followup from [facility], status post recent hospitalization with acute/chronic CHF. Cardiac markers negative for MI, with nondiagnostic troponins up to 0.07. Patient was treated conservatively with IV diuretics, with no further cardiac workup recommended.  For reasons that are unclear, patient is no longer on beta blocker. When last seen here in the office in September, he was listed as being on Toprol 50 mg daily. Recent cardiology consult note indicated that he was taking Lopressor 50 twice a day at home.  Clinically, patient reports no significant change in his clinical status. He suggests significant DOE with minimal exertion, and can only ambulate with a walker. He denies any CP or palpitations. He lives alone and takes his own medications, and dutifully weighs himself daily. He is refraining from added salt. His weight is down 9 pounds since last OV. Nevertheless, he continues to have significant refractory peripheral edema.  Allergies  Allergen Reactions  . Contrast Media (Iodinated Diagnostic Agents)     Using during MRI    Current Outpatient Prescriptions  Medication Sig Dispense Refill  . fluticasone (FLONASE) 50 MCG/ACT nasal spray Place 2 sprays into the nose daily as needed. For allergies      . furosemide (LASIX) 80 MG tablet Take 80 mg by mouth 2 (two) times daily.      Marland Kitchen gabapentin (NEURONTIN) 100 MG capsule Take 200 mg by mouth daily.       . nitroGLYCERIN (NITROSTAT) 0.4 MG SL tablet Place 1 tablet (0.4 mg total) under the tongue every 5 (five) minutes as needed. For chest pain. CALL YOUR DOCTOR BEFORE USING.      Marland Kitchen potassium chloride SA (K-DUR,KLOR-CON) 20 MEQ tablet Take 20 mEq by mouth 2 (two) times daily.      Marland Kitchen zolpidem (AMBIEN) 10 MG tablet Take 10 mg by mouth at bedtime as needed. For sleep       . [DISCONTINUED] potassium chloride SA (K-DUR,KLOR-CON) 20 MEQ tablet Take 2 tablets (40 mEq total) by mouth daily.       . hydrochlorothiazide (MICROZIDE) 12.5 MG capsule Take 1 capsule (12.5 mg total) by mouth daily.  30 capsule  6  . metoprolol (LOPRESSOR) 50 MG tablet Take 1 tablet (50 mg total) by mouth 2 (two) times daily.  60 tablet  6  . [DISCONTINUED] lisinopril (PRINIVIL,ZESTRIL) 10 MG tablet Take 1 tablet (10 mg total) by mouth 2 (two) times daily.  60 tablet  11  . [DISCONTINUED] potassium chloride SA (K-DUR,KLOR-CON) 20 MEQ tablet TAKE ONE TABLET BY MOUTH TWICE DAILY.  60 tablet  1    Past Medical History  Diagnosis Date  . Umbilical hernia without mention of obstruction or gangrene   . Other and unspecified hyperlipidemia   . COPD (chronic obstructive pulmonary disease)   . Hypertension   . Cramp of limb   . Atrial flutter     s/p RFCA   . Atrial fibrillation     tikosyn rx d/c in setting of VT (possibly ischemia mediated though)  . CAD (coronary artery disease)     a) Status post balloon angioplasty, 1987, b) 4-vessel CABG, 1997, c) Bare metal stenting, SVG-RCA and SVG-LAD/diagonal graft, 06/2011, d) BMS stent to mid body of SVG to OM2 12/18/2011   . Aortic stenosis 06/05/2011  . Chronic kidney disease     CKD  . Complication of anesthesia     " The doctor told me not to let anyone put me to sleep"  .  Myocardial infarction   . GERD (gastroesophageal reflux disease)   . Arthritis   . Chronic diastolic heart failure   . Ventricular tachycardia     12/2011  . Pulmonary HTN     Past Surgical History  Procedure Date  . Appendectomy   . Back surgery   . Cholecystectomy   . Hernia repair   . Cardiac catheterization 06/19/2011    Moderate pulmonary hypertension, moderate AS (valve area: 1.05), severe 3 vessel CAD. 90% stensis in both SVG to LAD/diagonal and SVG to RCA. Patent SVG to OM.   Marland Kitchen Coronary angioplasty 06/19/2011    BMS to both SVG to RCA and SVG to LAD/diagonal  . Coronary artery bypass graft     History   Social History  . Marital Status: Married    Spouse Name: N/A     Number of Children: N/A  . Years of Education: N/A   Occupational History  . Not on file.   Social History Main Topics  . Smoking status: Former Smoker -- 0.3 packs/day for 55 years    Types: Cigars    Quit date: 07/16/1995  . Smokeless tobacco: Former Neurosurgeon    Types: Chew    Quit date: 07/15/1944     Comment: chewed tobacoo for 3 years  . Alcohol Use: No  . Drug Use: No  . Sexually Active: No   Other Topics Concern  . Not on file   Social History Narrative  . No narrative on file    No family history on file.  ROS: no nausea, vomiting; no fever, chills; no melena, hematochezia; no claudication  PHYSICAL EXAM: BP 146/78  Pulse 108  Ht 5\' 7"  (1.702 m)  Wt 185 lb (83.915 kg)  BMI 28.97 kg/m2  SpO2 97% GENERAL: 78 year old male, frail appearing, sitting in wheelchair; NAD HEENT: NCAT, PERRLA, EOMI; sclera clear; no xanthelasma NECK: positive JVD at 90 LUNGS: Bibasilar crackles CARDIAC: Irregularly irregular (S1, S2); 2-3/6 crescendo decrescendo murmur at base; no rubs or gallops ABDOMEN: soft, non-tender; intact BS EXTREMETIES: 3+ bilateral peripheral edema; bilateral erythema SKIN: warm/dry; no obvious rash/lesions MUSCULOSKELETAL: no joint deformity NEURO: no focal deficit; NL affect   EKG:    ASSESSMENT & PLAN:  Chronic systolic CHF (congestive heart failure) Patient reports no significant improvement in his clinical status, reporting significant DOE with minimal exertion. I suspect that this is multifactorial, secondary to uncontrolled atrial fibrillation, systolic heart failure, and significant aortic stenosis. In light of current examination findings, I recommend aggressive diuretic management. His Lasix dose was recently increased; therefore, I will add HCTZ 12.5 twice a day to his regimen. Followup labs to be ordered today. We'll also check a followup CXR. Recommend return visit with me in one week, for reassessment of clinical status.   Atrial  fibrillation Patient was discharged off beta blocker, and presents with elevated pulse reading. We'll resume Lopressor at 50 mg twice a day. As previously noted by Dr. Kirke Corin, patient is at increased risk for thromboembolic stroke. However, he has history of severe GI bleeding, requiring transfusion and discontinuation of all antiplatelet/anticoagulation therapy. Patient reports today that he did have an upper endoscopy, but did not precede with a recommended colonoscopy, because he was too weak. Review of prior records indicates that he had evidence of moderate gastritis, back in August. Patient will need to return to Dr. Teena Dunk, once he is more stable from a cardiac standpoint. In the meanwhile, we will continue monitoring closely off ASA or anticoagulant. Once his  GI evaluation is completed, recommendation is that he resume Coumadin anticoagulation, without concomitant ASA, given that he was previously treated with a BMS and not a DES for his CAD, back in May.  Aortic stenosis Assessed as moderately severe by echocardiogram, 12/2011. Patient previously deemed not a suitable candidate for AVR, but a possible candidate for TAVR, pending improvement in his clinical status.    Gene Carrel Leather, PAC

## 2012-06-04 NOTE — Assessment & Plan Note (Signed)
Patient reports no significant improvement in his clinical status, reporting significant DOE with minimal exertion. I suspect that this is multifactorial, secondary to uncontrolled atrial fibrillation, systolic heart failure, and significant aortic stenosis. In light of current examination findings, I recommend aggressive diuretic management. His Lasix dose was recently increased; therefore, I will add HCTZ 12.5 twice a day to his regimen. Followup labs to be ordered today. We'll also check a followup CXR. Recommend return visit with me in one week, for reassessment of clinical status.

## 2012-06-04 NOTE — Patient Instructions (Addendum)
   Lab - TODAY - BMET, BNP & chest x-ray  Office will contact with results  Begin Lopressor (Metoprolol Tart.) 50mg  twice a day    Begin HCTZ 12.5mg  twice a day   Continue all other current medications. Will hold off on colonoscopy for now.  Follow up in  1 week

## 2012-06-05 ENCOUNTER — Encounter: Payer: Self-pay | Admitting: Physician Assistant

## 2012-06-05 DIAGNOSIS — R55 Syncope and collapse: Secondary | ICD-10-CM

## 2012-06-05 DIAGNOSIS — I4891 Unspecified atrial fibrillation: Secondary | ICD-10-CM

## 2012-06-07 ENCOUNTER — Inpatient Hospital Stay (HOSPITAL_COMMUNITY): Payer: Medicare Other

## 2012-06-07 ENCOUNTER — Inpatient Hospital Stay (HOSPITAL_COMMUNITY)
Admission: AD | Admit: 2012-06-07 | Discharge: 2012-06-19 | DRG: 682 | Disposition: A | Discharge status: Hospice | Payer: Medicare Other | Source: Other Acute Inpatient Hospital | Attending: Internal Medicine | Admitting: Internal Medicine

## 2012-06-07 DIAGNOSIS — G92 Toxic encephalopathy: Secondary | ICD-10-CM | POA: Diagnosis present

## 2012-06-07 DIAGNOSIS — F411 Generalized anxiety disorder: Secondary | ICD-10-CM | POA: Diagnosis present

## 2012-06-07 DIAGNOSIS — J4489 Other specified chronic obstructive pulmonary disease: Secondary | ICD-10-CM | POA: Diagnosis present

## 2012-06-07 DIAGNOSIS — K219 Gastro-esophageal reflux disease without esophagitis: Secondary | ICD-10-CM | POA: Diagnosis present

## 2012-06-07 DIAGNOSIS — G4700 Insomnia, unspecified: Secondary | ICD-10-CM | POA: Diagnosis present

## 2012-06-07 DIAGNOSIS — D649 Anemia, unspecified: Secondary | ICD-10-CM | POA: Diagnosis present

## 2012-06-07 DIAGNOSIS — Y92009 Unspecified place in unspecified non-institutional (private) residence as the place of occurrence of the external cause: Secondary | ICD-10-CM

## 2012-06-07 DIAGNOSIS — K7200 Acute and subacute hepatic failure without coma: Secondary | ICD-10-CM

## 2012-06-07 DIAGNOSIS — F329 Major depressive disorder, single episode, unspecified: Secondary | ICD-10-CM | POA: Diagnosis present

## 2012-06-07 DIAGNOSIS — I5033 Acute on chronic diastolic (congestive) heart failure: Secondary | ICD-10-CM | POA: Diagnosis present

## 2012-06-07 DIAGNOSIS — I959 Hypotension, unspecified: Secondary | ICD-10-CM

## 2012-06-07 DIAGNOSIS — I4892 Unspecified atrial flutter: Secondary | ICD-10-CM

## 2012-06-07 DIAGNOSIS — Z9861 Coronary angioplasty status: Secondary | ICD-10-CM

## 2012-06-07 DIAGNOSIS — I472 Ventricular tachycardia, unspecified: Secondary | ICD-10-CM | POA: Diagnosis present

## 2012-06-07 DIAGNOSIS — R5383 Other fatigue: Secondary | ICD-10-CM

## 2012-06-07 DIAGNOSIS — R0602 Shortness of breath: Secondary | ICD-10-CM

## 2012-06-07 DIAGNOSIS — I252 Old myocardial infarction: Secondary | ICD-10-CM

## 2012-06-07 DIAGNOSIS — R627 Adult failure to thrive: Secondary | ICD-10-CM

## 2012-06-07 DIAGNOSIS — E785 Hyperlipidemia, unspecified: Secondary | ICD-10-CM | POA: Diagnosis present

## 2012-06-07 DIAGNOSIS — N179 Acute kidney failure, unspecified: Secondary | ICD-10-CM

## 2012-06-07 DIAGNOSIS — T4275XA Adverse effect of unspecified antiepileptic and sedative-hypnotic drugs, initial encounter: Secondary | ICD-10-CM | POA: Diagnosis present

## 2012-06-07 DIAGNOSIS — N170 Acute kidney failure with tubular necrosis: Principal | ICD-10-CM | POA: Diagnosis present

## 2012-06-07 DIAGNOSIS — I2510 Atherosclerotic heart disease of native coronary artery without angina pectoris: Secondary | ICD-10-CM | POA: Diagnosis present

## 2012-06-07 DIAGNOSIS — K5909 Other constipation: Secondary | ICD-10-CM | POA: Diagnosis present

## 2012-06-07 DIAGNOSIS — N189 Chronic kidney disease, unspecified: Secondary | ICD-10-CM

## 2012-06-07 DIAGNOSIS — Z515 Encounter for palliative care: Secondary | ICD-10-CM

## 2012-06-07 DIAGNOSIS — I50810 Right heart failure, unspecified: Secondary | ICD-10-CM

## 2012-06-07 DIAGNOSIS — I498 Other specified cardiac arrhythmias: Secondary | ICD-10-CM | POA: Diagnosis not present

## 2012-06-07 DIAGNOSIS — I10 Essential (primary) hypertension: Secondary | ICD-10-CM | POA: Diagnosis present

## 2012-06-07 DIAGNOSIS — N390 Urinary tract infection, site not specified: Secondary | ICD-10-CM | POA: Diagnosis not present

## 2012-06-07 DIAGNOSIS — I5022 Chronic systolic (congestive) heart failure: Secondary | ICD-10-CM

## 2012-06-07 DIAGNOSIS — I4729 Other ventricular tachycardia: Secondary | ICD-10-CM | POA: Diagnosis not present

## 2012-06-07 DIAGNOSIS — T424X5A Adverse effect of benzodiazepines, initial encounter: Secondary | ICD-10-CM | POA: Diagnosis present

## 2012-06-07 DIAGNOSIS — N183 Chronic kidney disease, stage 3 unspecified: Secondary | ICD-10-CM | POA: Diagnosis present

## 2012-06-07 DIAGNOSIS — G929 Unspecified toxic encephalopathy: Secondary | ICD-10-CM | POA: Diagnosis present

## 2012-06-07 DIAGNOSIS — J449 Chronic obstructive pulmonary disease, unspecified: Secondary | ICD-10-CM | POA: Diagnosis present

## 2012-06-07 DIAGNOSIS — I350 Nonrheumatic aortic (valve) stenosis: Secondary | ICD-10-CM

## 2012-06-07 DIAGNOSIS — R5381 Other malaise: Secondary | ICD-10-CM | POA: Diagnosis present

## 2012-06-07 DIAGNOSIS — R252 Cramp and spasm: Secondary | ICD-10-CM

## 2012-06-07 DIAGNOSIS — Z951 Presence of aortocoronary bypass graft: Secondary | ICD-10-CM

## 2012-06-07 DIAGNOSIS — E876 Hypokalemia: Secondary | ICD-10-CM

## 2012-06-07 DIAGNOSIS — N289 Disorder of kidney and ureter, unspecified: Secondary | ICD-10-CM | POA: Diagnosis present

## 2012-06-07 DIAGNOSIS — F17200 Nicotine dependence, unspecified, uncomplicated: Secondary | ICD-10-CM | POA: Diagnosis present

## 2012-06-07 DIAGNOSIS — I509 Heart failure, unspecified: Secondary | ICD-10-CM

## 2012-06-07 DIAGNOSIS — D696 Thrombocytopenia, unspecified: Secondary | ICD-10-CM | POA: Diagnosis not present

## 2012-06-07 DIAGNOSIS — M549 Dorsalgia, unspecified: Secondary | ICD-10-CM

## 2012-06-07 DIAGNOSIS — F419 Anxiety disorder, unspecified: Secondary | ICD-10-CM

## 2012-06-07 DIAGNOSIS — Z79899 Other long term (current) drug therapy: Secondary | ICD-10-CM

## 2012-06-07 DIAGNOSIS — I359 Nonrheumatic aortic valve disorder, unspecified: Secondary | ICD-10-CM | POA: Diagnosis present

## 2012-06-07 DIAGNOSIS — Z66 Do not resuscitate: Secondary | ICD-10-CM | POA: Diagnosis not present

## 2012-06-07 DIAGNOSIS — I2789 Other specified pulmonary heart diseases: Secondary | ICD-10-CM | POA: Diagnosis present

## 2012-06-07 DIAGNOSIS — R55 Syncope and collapse: Secondary | ICD-10-CM | POA: Diagnosis present

## 2012-06-07 DIAGNOSIS — I4891 Unspecified atrial fibrillation: Secondary | ICD-10-CM | POA: Diagnosis present

## 2012-06-07 DIAGNOSIS — I129 Hypertensive chronic kidney disease with stage 1 through stage 4 chronic kidney disease, or unspecified chronic kidney disease: Secondary | ICD-10-CM | POA: Diagnosis present

## 2012-06-07 DIAGNOSIS — R45851 Suicidal ideations: Secondary | ICD-10-CM

## 2012-06-07 DIAGNOSIS — R0600 Dyspnea, unspecified: Secondary | ICD-10-CM

## 2012-06-07 DIAGNOSIS — D631 Anemia in chronic kidney disease: Secondary | ICD-10-CM | POA: Diagnosis present

## 2012-06-07 LAB — BASIC METABOLIC PANEL
Calcium: 8.8 mg/dL (ref 8.4–10.5)
Creatinine, Ser: 3.86 mg/dL — ABNORMAL HIGH (ref 0.50–1.35)
GFR calc Af Amer: 15 mL/min — ABNORMAL LOW (ref >90)

## 2012-06-07 LAB — HEPATIC FUNCTION PANEL
Alkaline Phosphatase: 125 U/L — ABNORMAL HIGH (ref 39–117)
Indirect Bilirubin: 1.5 mg/dL — ABNORMAL HIGH (ref 0.3–0.9)
Total Protein: 6.8 g/dL (ref 6.0–8.3)

## 2012-06-07 LAB — CBC
MCH: 25.3 pg — ABNORMAL LOW (ref 26.0–34.0)
MCHC: 29.9 g/dL — ABNORMAL LOW (ref 30.0–36.0)
Platelets: 68 10*3/uL — ABNORMAL LOW (ref 150–400)
RDW: 17.6 % — ABNORMAL HIGH (ref 11.5–15.5)

## 2012-06-07 LAB — AMMONIA: Ammonia: 36 umol/L (ref 11–60)

## 2012-06-07 LAB — POCT I-STAT 3, ART BLOOD GAS (G3+)
Acid-base deficit: 6 mmol/L — ABNORMAL HIGH (ref 0.0–2.0)
O2 Saturation: 99 %
pCO2 arterial: 41.2 mmHg (ref 35.0–45.0)

## 2012-06-07 LAB — PROTIME-INR: INR: 2.79 — ABNORMAL HIGH (ref 0.00–1.49)

## 2012-06-07 MED ORDER — DOCUSATE SODIUM 100 MG PO CAPS
100.0000 mg | ORAL_CAPSULE | Freq: Two times a day (BID) | ORAL | Status: DC
  Administered 2012-06-08 – 2012-06-19 (×22): 100 mg via ORAL
  Filled 2012-06-07 (×27): qty 1

## 2012-06-07 MED ORDER — ALBUTEROL SULFATE (5 MG/ML) 0.5% IN NEBU: 2.5000 mg | INHALATION_SOLUTION | RESPIRATORY_TRACT | Status: DC | PRN

## 2012-06-07 MED ORDER — HEPARIN SODIUM (PORCINE) 5000 UNIT/ML IJ SOLN
5000.0000 [IU] | Freq: Three times a day (TID) | INTRAMUSCULAR | Status: DC
  Administered 2012-06-07: 5000 [IU] via SUBCUTANEOUS
  Filled 2012-06-07 (×6): qty 1

## 2012-06-07 MED ORDER — ONDANSETRON HCL 4 MG/2ML IJ SOLN: 4.0000 mg | Freq: Four times a day (QID) | INTRAMUSCULAR | Status: DC | PRN

## 2012-06-07 MED ORDER — SODIUM POLYSTYRENE SULFONATE 15 GM/60ML PO SUSP
30.0000 g | Freq: Once | ORAL | Status: AC
  Administered 2012-06-07: 30 g via ORAL
  Filled 2012-06-07: qty 120

## 2012-06-07 MED ORDER — NITROGLYCERIN 0.4 MG SL SUBL: 0.4000 mg | SUBLINGUAL_TABLET | SUBLINGUAL | Status: DC | PRN

## 2012-06-07 MED ORDER — METOPROLOL TARTRATE 12.5 MG HALF TABLET
12.5000 mg | ORAL_TABLET | Freq: Two times a day (BID) | ORAL | Status: DC
  Filled 2012-06-07: qty 1

## 2012-06-07 MED ORDER — ASPIRIN EC 325 MG PO TBEC
325.0000 mg | DELAYED_RELEASE_TABLET | Freq: Every day | ORAL | Status: DC
  Administered 2012-06-07 – 2012-06-08 (×2): 325 mg via ORAL
  Filled 2012-06-07 (×2): qty 1

## 2012-06-07 MED ORDER — HYDROCODONE-ACETAMINOPHEN 5-325 MG PO TABS: 1.0000 | ORAL_TABLET | Freq: Four times a day (QID) | ORAL | Status: DC | PRN

## 2012-06-07 MED ORDER — ZOLPIDEM TARTRATE 5 MG PO TABS
5.0000 mg | ORAL_TABLET | Freq: Every evening | ORAL | Status: DC | PRN
  Administered 2012-06-07 – 2012-06-18 (×6): 5 mg via ORAL
  Filled 2012-06-07 (×6): qty 1

## 2012-06-07 MED ORDER — ONDANSETRON HCL 4 MG PO TABS
4.0000 mg | ORAL_TABLET | Freq: Four times a day (QID) | ORAL | Status: DC | PRN
  Administered 2012-06-16: 4 mg via ORAL
  Filled 2012-06-07 (×2): qty 1

## 2012-06-07 MED ORDER — SODIUM CHLORIDE 0.9 % IJ SOLN
3.0000 mL | Freq: Two times a day (BID) | INTRAMUSCULAR | Status: DC
  Administered 2012-06-08 – 2012-06-19 (×22): 3 mL via INTRAVENOUS

## 2012-06-07 MED ORDER — GABAPENTIN 100 MG PO CAPS
200.0000 mg | ORAL_CAPSULE | Freq: Every day | ORAL | Status: DC
  Administered 2012-06-07 – 2012-06-08 (×2): 200 mg via ORAL
  Filled 2012-06-07 (×2): qty 2

## 2012-06-07 MED ORDER — ACETAMINOPHEN 325 MG PO TABS
650.0000 mg | ORAL_TABLET | Freq: Four times a day (QID) | ORAL | Status: DC | PRN
  Administered 2012-06-10: 650 mg via ORAL
  Filled 2012-06-07: qty 2

## 2012-06-07 MED ORDER — SODIUM CHLORIDE 0.9 % IV SOLN
INTRAVENOUS | Status: DC
  Administered 2012-06-07: 1000 mL via INTRAVENOUS
  Administered 2012-06-08: 22:00:00 via INTRAVENOUS

## 2012-06-07 MED ORDER — ACETAMINOPHEN 650 MG RE SUPP: 650.0000 mg | Freq: Four times a day (QID) | RECTAL | Status: DC | PRN

## 2012-06-07 NOTE — H&P (Signed)
PATIENT DETAILS Name: Juan Wolf Age: 78 y.o. Sex: male Date of Birth: Nov 21, 1921 Admit Date: 06/07/2012 JYN:WGNF,AOZHY B., MD   CHIEF COMPLAINT:  Transfer from more the hospital worsening acute on chronic renal failure and acute hepatitis  HPI: Patient is a 78 year old Caucasian male with a past medical history of coronary artery disease status post CABG and subsequent PCI , history of atrial fibrillation not on anticoagulation secondary to gastrointestinal bleeding, moderate to severe aortic stenosis, diastolic heart failure who has been transferred from [facility] evaluation of worsening acute on chronic renal failure and worsening transaminitis. Patient was admitted to [facility] on 06/04/2012 for shortness of breath, while in the waiting area of the hospital he had a syncopal episode. He was then brought to the emergency room, where he was found to be hypotensive and as a result he was given IV fluids. He was subsequently admitted, he was thought to have acute on chronic diastolic heart failure and started on gentle diuresis. Unfortunately during his hospital course, his renal function continued to worsen, his liver enzymes continued to worsen as well. Family claims that all of his diuretics were stopped yesterday and patient was then started on intravenous fluids. Because of ongoing worsening of his renal and liver function, patient was transferred to [facility] intensive care unit, he was evaluated by PCCM and subsequently downgraded and tried hospitalist was asked to admit this patient. Unfortunately just prior to the patient's transfer from [facility], and he claims he was given morphine and a benzodiazepine, as a result during my evaluation, patient is drowsy and slightly lethargic and not able to participate in the history taking process. Patient's son and daughter-in-law at the bedside, they claim that the patient has been vomiting almost on a daily basis  since admission. He has been mostly on a liquid diet. There is no history of diarrhea. Denies any knowledge of abdominal pain. Otherwise the patient is comfortable and not in any distress.   ALLERGIES:   Allergies  Allergen Reactions  . Contrast Media (Iodinated Diagnostic Agents)     Using during MRI    PAST MEDICAL HISTORY: Past Medical History  Diagnosis Date  . Umbilical hernia without mention of obstruction or gangrene   . Other and unspecified hyperlipidemia   . COPD (chronic obstructive pulmonary disease)   . Hypertension   . Cramp of limb   . Atrial flutter     s/p RFCA   . Atrial fibrillation     tikosyn rx d/c in setting of VT (possibly ischemia mediated though)  . CAD (coronary artery disease)     a) Status post balloon angioplasty, 1987, b) 4-vessel CABG, 1997, c) Bare metal stenting, SVG-RCA and SVG-LAD/diagonal graft, 06/2011, d) BMS stent to mid body of SVG to OM2 12/18/2011   . Aortic stenosis 06/05/2011  . Chronic kidney disease     CKD  . Complication of anesthesia     " The doctor told me not to let anyone put me to sleep"  . Myocardial infarction   . GERD (gastroesophageal reflux disease)   . Arthritis   . Chronic diastolic heart failure   . Ventricular tachycardia     12/2011  . Pulmonary HTN     PAST SURGICAL HISTORY: Past Surgical History  Procedure Date  . Appendectomy   . Back surgery   . Cholecystectomy   . Hernia repair   . Cardiac catheterization 06/19/2011    Moderate pulmonary hypertension, moderate AS (valve area:  1.05), severe 3 vessel CAD. 90% stensis in both SVG to LAD/diagonal and SVG to RCA. Patent SVG to OM.   Marland Kitchen Coronary angioplasty 06/19/2011    BMS to both SVG to RCA and SVG to LAD/diagonal  . Coronary artery bypass graft     MEDICATIONS AT HOME: Prior to Admission medications   Medication Sig Start Date End Date Taking? Authorizing Provider  fluticasone (FLONASE) 50 MCG/ACT nasal spray Place 2 sprays into the nose daily as  needed. For allergies    Historical Provider, MD  furosemide (LASIX) 80 MG tablet Take 80 mg by mouth 2 (two) times daily.    Historical Provider, MD  gabapentin (NEURONTIN) 100 MG capsule Take 200 mg by mouth daily.     Historical Provider, MD  hydrochlorothiazide (MICROZIDE) 12.5 MG capsule Take 1 capsule (12.5 mg total) by mouth daily. 06/04/12   Prescott Parma, PA  metoprolol (LOPRESSOR) 50 MG tablet Take 1 tablet (50 mg total) by mouth 2 (two) times daily. 06/04/12   Prescott Parma, PA  nitroGLYCERIN (NITROSTAT) 0.4 MG SL tablet Place 1 tablet (0.4 mg total) under the tongue every 5 (five) minutes as needed. For chest pain. CALL YOUR DOCTOR BEFORE USING. 12/21/11   Dayna N Dunn, PA  potassium chloride SA (K-DUR,KLOR-CON) 20 MEQ tablet Take 20 mEq by mouth 2 (two) times daily. 12/21/11   Dayna N Dunn, PA  zolpidem (AMBIEN) 10 MG tablet Take 10 mg by mouth at bedtime as needed. For sleep     Historical Provider, MD    FAMILY HISTORY: No family history on file.  SOCIAL HISTORY:  reports that he quit smoking about 16 years ago. His smoking use included Cigars. He quit smokeless tobacco use about 67 years ago. His smokeless tobacco use included Chew. He reports that he does not drink alcohol or use illicit drugs.  REVIEW OF SYSTEMS:  Constitutional:   No  weight loss, night sweats,  Fevers, chills, fatigue.  HEENT:    No headaches, Difficulty swallowing,Tooth/dental problems,Sore throat,  No sneezing, itching, ear ache, nasal congestion, post nasal drip,   Cardio-vascular: No chest pain,  Orthopnea, PND, swelling in lower extremities, anasarca, dizziness, palpitations  GI:  No heartburn, indigestion, abdominal pain, diarrhea, change in  bowel habits, loss of appetite  Resp:  No excess mucus, no productive cough, No non-productive cough,  No coughing up of blood.No change in color of mucus.No wheezing.No chest wall deformity  Skin:  no rash or lesions.  GU:  no dysuria, change in color  of urine, no urgency or frequency.  No flank pain.  Musculoskeletal: No joint pain or swelling.  No decreased range of motion.  No back pain.  Psych: No change in mood or affect. No depression or anxiety.  No memory loss.   PHYSICAL EXAM: Blood pressure 124/61, pulse 60, temperature 98.9 F (37.2 C), temperature source Other (Comment), SpO2 99.00%.  General appearance :Awake, but lethargic not in any distress. Not toxic Looking HEENT: Atraumatic and Normocephalic, pupils equally reactive to light and accomodation Neck: supple, no JVD. No cervical lymphadenopathy.  Chest:Good air entry bilaterally,  Few bibasilar rales  CVS: S1 S2 regular, systolic murmur best heard in the right base .  Abdomen: Bowel sounds present, Non tender and not distended with no gaurding, rigidity or rebound. Extremities: B/L Lower Ext shows trace edema, both legs are warm to touch. Neurology: Awake but lethargic, Non focal Skin:No Rash Wounds:N/A  LABS ON ADMISSION:  No results found for this basename: NA:2,K:2,CL:2,CO2:2,GLUCOSE:2,BUN:2,CREATININE:2,CALCIUM:2,MG:2,PHOS:2  in the last 72 hours No results found for this basename: AST:2,ALT:2,ALKPHOS:2,BILITOT:2,PROT:2,ALBUMIN:2 in the last 72 hours No results found for this basename: LIPASE:2,AMYLASE:2 in the last 72 hours No results found for this basename: WBC:2,NEUTROABS:2,HGB:2,HCT:2,MCV:2,PLT:2 in the last 72 hours No results found for this basename: CKTOTAL:3,CKMB:3,CKMBINDEX:3,TROPONINI:3 in the last 72 hours No results found for this basename: DDIMER:2 in the last 72 hours No components found with this basename: POCBNP:3   RADIOLOGIC STUDIES ON ADMISSION: Dg Chest Port 1 View  06/07/2012  *RADIOLOGY REPORT*  Clinical Data: Congestive heart failure.  PORTABLE CHEST - 1 VIEW  Comparison: Chest 06/04/2012.  Findings: Bilateral pleural effusions and basilar airspace disease persist.  The patient's right effusion has worsened.  There is cardiomegaly and  interstitial edema.  IMPRESSION: Increased interstitial edema.  Bilateral pleural effusions persist and the right effusion is larger than on the prior study.   Original Report Authenticated By: Holley Dexter, M.D.     ASSESSMENT AND PLAN: Present on Admission:  Acute on chronic Renal failure (stage III) -Suspect etiology here to be ATN-probably triggered by hypotension and possibly worsened by low flow state from CHF and ongoing diuretic therapy -Avoid nephrotoxic medications, obtain a renal ultrasound, monitor electrolytes closely -He does not appear to be overtly volume overloaded, we could try gentle hydration and was monitored his clinical course -Given multiple medical comorbidities, advanced age and the patient's desire not to pursue aggressive medical care (per family at bedside)-I doubt he would be a candidate for dialysis if it comes to that. Will need to discuss with patient when he is more awake, if his renal function continues to deteriorate.  . Acute hepatitis/transaminitis -Suspect etiology to be from shock liver-triggered by hypotensive episode, other contributions could be right-sided failure-however on clinical exam he does not have significant finding suggestive of right-sided heart failure -Avoid hepatotoxic medications, monitor LFTs. -Obtain liver ultrasound and acute hepatitis serology  . Acute on chronic diastolic CHF (congestive heart failure), NYHA class 1 -Patient has chronic diastolic congestive heart failure, he is not overtly volume overloaded, his chest x-ray does show signs of pulmonary edema-however clinically he seems to be comfortable. -With ongoing worsening renal failure, will not currently initiate diuretic therapy  -Await cardiology consultation -Strict intake and output, daily weights  . Aortic stenosis -Per prior notes from cardiology patient is not a candidate for aVR   . Anemia -Does have a history of anemia-likely from chronic kidney  disease/chronic disease  -Monitor H&H -he does have a history of GI bleed in the past. The family denies overt Gastrointestinal bleeding while at [facility]   . Atrial fibrillation -Does have a history of atrial fibrillation, and given prior history of GI bleeding he is not a candidate for anticoagulation therapy.  -His rhythm currently appears to be mostly sinus bradycardia-with rates down into the high 30s at times.  . Altered mental status  -Suspect mostly toxic encephalopathy-probably has a consequence of a narcotic and benzodiazepine and therapy  -Moving all of his 4 extremities, monitor for now   . COPD -As needed bronchodilators  -Continue oxygen  -Some rales at bases but otherwise not  wheezing   . CAD (coronary artery disease) -Continue with antiplatelet agents   . Syncope -This occurred 2-3 days back  -Per family this occurred while he was defecating -likely neurocardiogenic , however does have a history of moderate to severe it is in this could have caused this as well .He apparently does have a history of V. tach in  the past, given ischemic heart disease -this could be a consideration as well. Monitor telemetry   . HYPERTENSION -Blood pressure controlled currently  .Dyslipidemia -Resume statins when able-but currently holding given transaminitis  Further plan will depend as patient's clinical course evolves and further radiologic and laboratory data become available. Patient will be monitored closely.   DVT Prophylaxis: -Heparin  Code Status: -DO NOT RESUSCITATE  Total time spent for admission equals 45 minutes.  [facility] Pager [phone]  If 7PM-7AM, please contact night-coverage [email or web address] Password [facility] 06/07/2012, 3:57 PM

## 2012-06-07 NOTE — Consult Note (Signed)
CARDIOLOGY CONSULT NOTE  Patient ID: Juan Wolf, MRN: [id number], DOB/AGE: [birth year] 78 y.o. Admit date: 06/07/2012 Date of Consult: 06/07/2012  Primary Physician: Ignatius Specking., MD Primary Cardiologist: Dr. Kirke Corin  Chief Complaint: shortness of breath, syncope Reason for Consultation: CHF, AS  HPI: 78 y.o. male w/ PMHx significant for CAD (s/p CABG and subsequent PCI), A.Fib/flutter (s/p RFCA, no anticoag 2/2 GI bleed), Mod AS, Chronic Diastolic CHF, VT, Pulm HTN, HTN, HLD, and CKD who transferred from [facility] to [facility] on 06/07/2012 with worsening renal and liver failure.  Lives alone but family lives close by. Over past two months has had marked functional decline and barely able to get around the house. Reently admitted with HF flare and meds adjusted.   Readmitted to [facility] on 06/04/12 with complaints of shortness of breath thought related to CHF exacerbation and AS. While there he had a syncopal episode in the setting of hypotension for which he was given IVF. He was then diuresed with resultant worsening renal and liver function for which he was transferred to [facility].   Baseline Cr ~ 1.2 at [place] went up to almost 4.   Echo at [facility] showed normal LV systolic function (EF 50%) with restrictive physiology. AV markedly thickened but gradient only 16 mean with AVA 1.03cm2. Felt to be moderate AS. RV severely dilated and hypokinetic. Moderate TR. RVSP 30   Past Medical History  Diagnosis Date  . Umbilical hernia without mention of obstruction or gangrene   . Other and unspecified hyperlipidemia   . COPD (chronic obstructive pulmonary disease)   . Hypertension   . Cramp of limb   . Atrial flutter     s/p RFCA   . Atrial fibrillation     tikosyn rx d/c in setting of VT (possibly ischemia mediated though)  . CAD (coronary artery disease)     a) Status post balloon angioplasty, 1987, b) 4-vessel CABG, 1997, c) Bare metal stenting, SVG-RCA and  SVG-LAD/diagonal graft, 06/2011, d) BMS stent to mid body of SVG to OM2 12/18/2011   . Aortic stenosis 06/05/2011  . Chronic kidney disease     CKD  . Complication of anesthesia     " The doctor told me not to let anyone put me to sleep"  . Myocardial infarction   . GERD (gastroesophageal reflux disease)   . Arthritis   . Chronic diastolic heart failure   . Ventricular tachycardia     12/2011  . Pulmonary HTN       Surgical History:  Past Surgical History  Procedure Date  . Appendectomy   . Back surgery   . Cholecystectomy   . Hernia repair   . Cardiac catheterization 06/19/2011    Moderate pulmonary hypertension, moderate AS (valve area: 1.05), severe 3 vessel CAD. 90% stensis in both SVG to LAD/diagonal and SVG to RCA. Patent SVG to OM.   Marland Kitchen Coronary angioplasty 06/19/2011    BMS to both SVG to RCA and SVG to LAD/diagonal  . Coronary artery bypass graft      Home Meds: Medication Sig  fluticasone (FLONASE) 50 MCG/ACT nasal spray Place 2 sprays into the nose daily as needed. For allergies  furosemide (LASIX) 80 MG tablet Take 80 mg by mouth 2 (two) times daily.  gabapentin (NEURONTIN) 100 MG capsule Take 200 mg by mouth daily.   hydrochlorothiazide (MICROZIDE) 12.5 MG capsule Take 1 capsule (12.5 mg total) by mouth daily.  metoprolol (LOPRESSOR) 50 MG tablet Take 1 tablet (  50 mg total) by mouth 2 (two) times daily.  nitroGLYCERIN (NITROSTAT) 0.4 MG SL tablet Place 1 tablet (0.4 mg total) under the tongue every 5 (five) minutes as needed. For chest pain. CALL YOUR DOCTOR BEFORE USING.  potassium chloride SA (K-DUR,KLOR-CON) 20 MEQ tablet Take 20 mEq by mouth 2 (two) times daily.  zolpidem (AMBIEN) 10 MG tablet Take 10 mg by mouth at bedtime as needed. For sleep     Inpatient Medications:        Allergies:  Allergies  Allergen Reactions  . Contrast Media (Iodinated Diagnostic Agents)     Using during MRI    History   Social History  . Marital Status: Married     Spouse Name: N/A    Number of Children: N/A  . Years of Education: N/A   Occupational History  . Not on file.   Social History Main Topics  . Smoking status: Former Smoker -- 0.3 packs/day for 55 years    Types: Cigars    Quit date: 07/16/1995  . Smokeless tobacco: Former Neurosurgeon    Types: Chew    Quit date: 07/15/1944     Comment: chewed tobacoo for 3 years  . Alcohol Use: No  . Drug Use: No  . Sexually Active: No   Other Topics Concern  . Not on file   Social History Narrative  . No narrative on file     Family history on file:Unable to obtain as pt is lethargic  Review of Systems:Unable to obtain as pt is lethargic   Labs:  Component Value Date   WBC 7.5 06/07/2012   HGB 10.9* 06/07/2012   HCT 36.4* 06/07/2012   MCV 84.5 06/07/2012   PLT PENDING 06/07/2012   Radiology/Studies:   06/07/2012 - PORTABLE CHEST - 1 VIEW Findings: Bilateral pleural effusions and basilar airspace disease persist.  The patient's right effusion has worsened.  There is cardiomegaly and interstitial edema.  IMPRESSION: Increased interstitial edema.  Bilateral pleural effusions persist and the right effusion is larger than on the prior study.    EKG: 06/04/12 Sinus rhythm 76bpm with PACs/PVCs, QTc 579, PR 53  Physical Exam: Blood pressure 124/61, pulse 60, temperature 97.4 F (36.3 C), temperature source Oral, SpO2 99.00%. General: Chronically ill appearing, lethargic. Head: Normocephalic, atraumatic, sclera non-icteric, no xanthomas, nares are without discharge.  Neck: Supple. Negative for carotid bruits. No JVD. Lungs: Mild Rhonchi. No wheezes, rales, or rhonchi. Breathing is unlabored. Heart: Irregularly irregular, slow. 2/6 SEM with mildly depressed S2. No rubs or gallops appreciated. Abdomen: Soft, non-distended with normoactive bowel sounds. No hepatomegaly. No rebound/guarding. No obvious abdominal masses. Msk:  Strength and tone appear normal for age. Extremities: Chronic venous  stasis skin changes. No clubbing or cyanosis. No edema.  Distal pedal pulses are intact and equal bilaterally. Neuro: Lethargic, arouses to stimuli Psych:  Lethargic, arouses to stimuli   Assessment:  78 y.o. male w/ PMHx significant for CAD (s/p CABG and subsequent PCI), A.Fib/flutter (s/p RFCA, no anticoag 2/2 GI bleed), Mod AS, Chronic Diastolic CHF, VT, Pulm HTN, HTN, HLD, and CKD who transferred from [facility] to [facility] on 06/07/2012 with worsening renal and liver failure.  1. Hypotension 2. Acute on chronic renal failure 3. Shocked liver 4. Chronic Diastolic CHF 5. Syncope 6. Aortic Stenosis, Moderate 7.Altered Mental Status 8. Acute delirium 9. Paroxysmal Atrial fibrillation currently in sinus bradycardia, no anticoag due to h/o GI bleed 10. Anemia 11. Coronary Artery Disease s/p CABG w/ subsequent PCIs 12.  Hypertension 13. Hyperlipidemia 14. Recent GI bleed 15. Physical deconditioning 16. DNR/DNI   Signed, HOPE, JESSICA PA-C 06/07/2012, 4:46 PM   Plan/discussion:   Patient seen and examined with Berton Mount, NP. We discussed all aspects of the encounter. I agree with the assessment and plan as stated above.    Mr. Sena has multi-organ system failure in the setting of volume depletion. With his severe RV dysfunction and AS he is volume dependent. Would hydrate steadily but cautiously. Would place PICC to help monitor CVPs and co-ox. Given his overall debility he is not candidate for TAVR or long-term HD.   We will follow closely. I have d/w patient and his family as well Dr. Jerral Ralph.  Truman Hayward 5:17 PM

## 2012-06-07 NOTE — Consult Note (Signed)
PULMONARY  / CRITICAL CARE MEDICINE  Name: Juan Wolf MRN: [id number] DOB: 1922-01-12    LOS: 0  REFERRING MD :  [facility]  CHIEF COMPLAINT:  AKI, Hepatic failure  BRIEF PATIENT DESCRIPTION:  78 y/o male with severe aortic stenosis, CAD, and A-fib who was transferred to [facility] on 11/24 for work up of acute renal failure and acute hepatic failure.  LINES / TUBES: peripheral  CULTURES:   ANTIBIOTICS:   SIGNIFICANT EVENTS:  11/22 TTE >> mod LVH, LVEF 50%, RV dilation, RA dilation, LA dilation, mod-severe AS, mild AI, IVC dilation, mod to severe RV dysfunction  LEVEL OF CARE:   PRIMARY SERVICE:  [facility] CONSULTANTS:  PCCM, Cardiology CODE STATUS: DNR/DNI DIET:  Cardiac, heart healthy DVT Px:  Per [facility] GI Px:  Per [facility]  HISTORY OF PRESENT ILLNESS:  This is an 78 y/o male with known aortic stenosis who presented to [facility] on 11/21 with volume overload.  He was found to have renal failure and so was admitted to [facility] for work up of the same.  There he has been found to have progressive AKI and hepatic failure despite diuresis.  He has become more confused during the hospitalization.  The physicians at [facility] feel that this is due his aortic stenosis.  His family requested that he be transferred to [facility] since his physicians at [facility] know him well.  PCCM was asked to consider admission to the ICU by [facility].  Upon arrival well he was confused, but oxygenating well with normal vital signs so PCCM asked [facility] to admit.  PAST MEDICAL HISTORY :  Past Medical History  Diagnosis Date  . Umbilical hernia without mention of obstruction or gangrene   . Other and unspecified hyperlipidemia   . COPD (chronic obstructive pulmonary disease)   . Hypertension   . Cramp of limb   . Atrial flutter     s/p RFCA   . Atrial fibrillation     tikosyn rx d/c in setting of VT (possibly ischemia mediated though)  . CAD (coronary artery disease)     a) Status post balloon angioplasty, 1987, b)  4-vessel CABG, 1997, c) Bare metal stenting, SVG-RCA and SVG-LAD/diagonal graft, 06/2011, d) BMS stent to mid body of SVG to OM2 12/18/2011   . Aortic stenosis 06/05/2011  . Chronic kidney disease     CKD  . Complication of anesthesia     " The doctor told me not to let anyone put me to sleep"  . Myocardial infarction   . GERD (gastroesophageal reflux disease)   . Arthritis   . Chronic diastolic heart failure   . Ventricular tachycardia     12/2011  . Pulmonary HTN    Past Surgical History  Procedure Date  . Appendectomy   . Back surgery   . Cholecystectomy   . Hernia repair   . Cardiac catheterization 06/19/2011    Moderate pulmonary hypertension, moderate AS (valve area: 1.05), severe 3 vessel CAD. 90% stensis in both SVG to LAD/diagonal and SVG to RCA. Patent SVG to OM.   Marland Kitchen Coronary angioplasty 06/19/2011    BMS to both SVG to RCA and SVG to LAD/diagonal  . Coronary artery bypass graft    Prior to Admission medications   Medication Sig Start Date End Date Taking? Authorizing Provider  fluticasone (FLONASE) 50 MCG/ACT nasal spray Place 2 sprays into the nose daily as needed. For allergies    Historical Provider, MD  furosemide (LASIX) 80 MG tablet Take  80 mg by mouth 2 (two) times daily.    Historical Provider, MD  gabapentin (NEURONTIN) 100 MG capsule Take 200 mg by mouth daily.     Historical Provider, MD  hydrochlorothiazide (MICROZIDE) 12.5 MG capsule Take 1 capsule (12.5 mg total) by mouth daily. 06/04/12   Prescott Parma, PA  metoprolol (LOPRESSOR) 50 MG tablet Take 1 tablet (50 mg total) by mouth 2 (two) times daily. 06/04/12   Prescott Parma, PA  nitroGLYCERIN (NITROSTAT) 0.4 MG SL tablet Place 1 tablet (0.4 mg total) under the tongue every 5 (five) minutes as needed. For chest pain. CALL YOUR DOCTOR BEFORE USING. 12/21/11   Dayna N Dunn, PA  potassium chloride SA (K-DUR,KLOR-CON) 20 MEQ tablet Take 20 mEq by mouth 2 (two) times daily. 12/21/11   Dayna N Dunn, PA  zolpidem (AMBIEN)  10 MG tablet Take 10 mg by mouth at bedtime as needed. For sleep     Historical Provider, MD   Allergies  Allergen Reactions  . Contrast Media (Iodinated Diagnostic Agents)     Using during MRI    FAMILY HISTORY:  No family history on file. SOCIAL HISTORY:  reports that he quit smoking about 16 years ago. His smoking use included Cigars. He quit smokeless tobacco use about 67 years ago. His smokeless tobacco use included Chew. He reports that he does not drink alcohol or use illicit drugs.  REVIEW OF SYSTEMS:  Cannot obtain due to confusion   INTERVAL HISTORY:   VITAL SIGNS: Temp:  [98.9 F (37.2 C)] 98.9 F (37.2 C) (11/24 1400) Pulse Rate:  [60] 60  (11/24 1400) BP: (124)/(61) 124/61 mmHg (11/24 1400) SpO2:  [99 %] 99 % (11/24 1400) HEMODYNAMICS:   VENTILATOR SETTINGS:   INTAKE / OUTPUT: Intake/Output    None     PHYSICAL EXAMINATION: General:  Weak, somnolent but arouses to voice Neuro:  Somnolent, oriented to [facility], maew HEENT:  NCAT, PERRL, EOMi Cardiovascular:  RRR, slight systolic murmur, no clear JVD Lungs:  Crackles in bases, diminished in bases Abdomen:  BS+, soft, non tender Ext: pitting edema to knees bilaterally, warm, no clubbing Skin:  No rash   LABS: cbc No results found for this basename: WBC:3,PTL:3,HGB:3,HCT:3 in the last 168 hours chemistry No results found for this basename: NA:3,K:3CL:3,CO2:3,GLUCOSE:3,BUN:3,CREATININE:3,CALCIUM:3,MG:3,PHOS:3 in the last 168 hours Liver fxn No results found for this basename: AST:3,ALT:3,ALKPHOS:3,BILITOT:3,PROT:3,ALBUMIN:3 in the last 168 hours coags No results found for this basename: APTT:3,INR:3 in the last 168 hours Sepsis markers No results found for this basename: LATICACIDVEN:3,PROCALCITON:3 in the last 168 hours Cardiac markers No results found for this basename: CKTOTAL:3,CKMB:3,TROPONINI:3 in the last 168 hours BNP No results found for this basename: PROBNP:3 in the last 168 hours ABG  Lab  06/07/12 1449  PHART 7.296*  PCO2ART 41.2  PO2ART 136.0*  HCO3 20.1  TCO2 21    CBG trend No results found for this basename: GLUCAP:5 in the last 168 hours  IMAGING:  ECG:  DIAGNOSES: Active Problems:  COPD  CAD (coronary artery disease)  Aortic stenosis  Acute on chronic diastolic CHF (congestive heart failure), NYHA class 1  Renal insufficiency  Anemia   ASSESSMENT / PLAN:  PULMONARY  ASSESSMENT: 1) COPD by problem list, but no spiro on record, no COPD meds 2) Hypoxemic respiratory failure due to volume overload  PLAN:   -wean O2 as tolerated  CARDIOVASCULAR  ASSESSMENT:  1) Acute volume overload with pulmonary hypertension.  Presumably this is related to his Aortic stenosis given his history  2) Aortic stenosis 3) A-fib 4) CHF 5) CAD  PLAN:  -cardiology consult now -consider RHC if goals of care are aggressive  RENAL  ASSESSMENT:   1) AKI, ddx pre-renal from lack of forward flow vs other causes, nothing clearly nephrotoxic in meds or suggestive by history   PLAN:   -u/a -bmet -foley -consider renal ultrasound  GASTROINTESTINAL  ASSESSMENT:   1) Acute hepatitis - the current thought is that this is due to hepatic congestion; would typically expect more evidence of RHF on physical exam (more edema) and a higher bilirubin; consider viral, other toxic causes  PLAN:   -I will order LFT"s and an acute viral panel -would check RUQ with doppler  HEMATOLOGIC  ASSESSMENT:   1) Anemia 2) Thrombocytopenia PLAN:  -would check DIC panel  INFECTIOUS  ASSESSMENT:   1) No acute issues  PLAN:     ENDOCRINE  ASSESSMENT:   1) no acute issues PLAN:   -  NEUROLOGIC  ASSESSMENT:   1) Encephalopathy, unclear etiology, hyperammonemia? Uremia?  PLAN:   -check ammonia -frequent orientation -no sedating meds  CLINICAL SUMMARY:  78 y/o male with severe aortic stenosis admitted with volume overload, hepatic failure, renal failure of  uncertain etiology.  Currently does not need ICU level care.  DNR/DNI.  Recommend [facility] admission, consult cardiology.  Let us know if we can help otherwise.  Yolonda Kida PCCM Pager: [phone] Cell: [phone] If no response, call [phone]  06/07/2012, 3:03 PM

## 2012-06-07 NOTE — Progress Notes (Signed)
eLink Physician-Brief Progress Note Patient Name: Juan Wolf DOB: July 05, 1922 MRN: [id number]  Date of Service  06/07/2012   HPI/Events of Note   Bradycardia dipping into 30's  eICU Interventions  Dc metoprolol   Intervention Category Major Interventions: Arrhythmia - evaluation and management  ALVA,RAKESH V. 06/07/2012, 6:32 PM

## 2012-06-08 ENCOUNTER — Inpatient Hospital Stay (HOSPITAL_COMMUNITY): Payer: Medicare Other

## 2012-06-08 LAB — IRON AND TIBC
Saturation Ratios: 6 % — ABNORMAL LOW (ref 20–55)
UIBC: 349 ug/dL (ref 125–400)

## 2012-06-08 LAB — COMPREHENSIVE METABOLIC PANEL
Alkaline Phosphatase: 129 U/L — ABNORMAL HIGH (ref 39–117)
BUN: 99 mg/dL — ABNORMAL HIGH (ref 6–23)
CO2: 21 mEq/L (ref 19–32)
Chloride: 97 mEq/L (ref 96–112)
GFR calc Af Amer: 14 mL/min — ABNORMAL LOW (ref >90)
GFR calc non Af Amer: 12 mL/min — ABNORMAL LOW (ref >90)
Glucose, Bld: 100 mg/dL — ABNORMAL HIGH (ref 70–99)
Potassium: 4.9 mEq/L (ref 3.5–5.1)
Total Bilirubin: 3.1 mg/dL — ABNORMAL HIGH (ref 0.3–1.2)
Total Protein: 6.6 g/dL (ref 6.0–8.3)

## 2012-06-08 LAB — RETICULOCYTES
RBC.: 4.27 MIL/uL (ref 4.22–5.81)
Retic Ct Pct: 2.3 % (ref 0.4–3.1)

## 2012-06-08 LAB — URINALYSIS, ROUTINE W REFLEX MICROSCOPIC
Bilirubin Urine: NEGATIVE
Glucose, UA: NEGATIVE mg/dL
Protein, ur: 100 mg/dL — AB
Urobilinogen, UA: 1 mg/dL (ref 0.0–1.0)

## 2012-06-08 LAB — CBC
HCT: 36 % — ABNORMAL LOW (ref 39.0–52.0)
Hemoglobin: 10.7 g/dL — ABNORMAL LOW (ref 13.0–17.0)
MCHC: 29.7 g/dL — ABNORMAL LOW (ref 30.0–36.0)

## 2012-06-08 LAB — URINE MICROSCOPIC-ADD ON

## 2012-06-08 LAB — FERRITIN: Ferritin: 141 ng/mL (ref 22–322)

## 2012-06-08 LAB — PROTIME-INR: Prothrombin Time: 29.5 seconds — ABNORMAL HIGH (ref 11.6–15.2)

## 2012-06-08 LAB — HEPATITIS PANEL, ACUTE
Hep A IgM: NEGATIVE
Hep B C IgM: NEGATIVE
Hepatitis B Surface Ag: NEGATIVE

## 2012-06-08 MED ORDER — OXYCODONE HCL 5 MG PO TABS
5.0000 mg | ORAL_TABLET | ORAL | Status: DC | PRN
  Administered 2012-06-09 – 2012-06-18 (×6): 5 mg via ORAL
  Filled 2012-06-08 (×6): qty 1

## 2012-06-08 MED ORDER — HEPARIN SODIUM (PORCINE) 5000 UNIT/ML IJ SOLN
5000.0000 [IU] | Freq: Three times a day (TID) | INTRAMUSCULAR | Status: DC
  Filled 2012-06-08 (×2): qty 1

## 2012-06-08 MED ORDER — BOOST / RESOURCE BREEZE PO LIQD
1.0000 | Freq: Three times a day (TID) | ORAL | Status: DC
  Administered 2012-06-08 – 2012-06-19 (×24): 1 via ORAL
  Filled 2012-06-08 (×4): qty 1

## 2012-06-08 NOTE — Progress Notes (Signed)
[facility]  Patient Status: Active (receiving services up to time of hospitalization)  [facility] is providing the following services: RN  If patient discharges after hours, please call [phone].   Jodene Nam 06/08/2012, 6:58 PM

## 2012-06-08 NOTE — Progress Notes (Signed)
[facility] Progress Note [place] TEAM 1 - Stepdown/ICU TEAM   Juan Wolf ZOX:[id number] DOB: 12/29/1921 DOA: 06/07/2012 PCP: Ignatius Specking., MD  Brief narrative: 78 year old male with a past medical history of coronary artery disease status post CABG and subsequent PCI , history of atrial fibrillation not on anticoagulation secondary to gastrointestinal bleeding, moderate to severe aortic stenosis, diastolic heart failure who has been transferred from [facility] evaluation of worsening acute on chronic renal failure and worsening transaminitis. Patient was admitted to [facility] on 06/04/2012 for shortness of breath, while in the waiting area of the hospital he had a syncopal episode. He was then brought to the emergency room, where he was found to be hypotensive and as a result he was given IV fluids. He was subsequently admitted, he was thought to have acute on chronic diastolic heart failure and started on gentle diuresis. Unfortunately during his hospital course, his renal function continued to worsen, his liver enzymes continued to worsen as well. Family claims that all of his diuretics were stopped yesterday and patient was then started on intravenous fluids. Because of ongoing worsening of his renal and liver function, patient was transferred to [facility] intensive care unit, he was evaluated by PCCM and subsequently downgraded and tried hospitalist was asked to admit this patient.  Just prior to the patient's transfer from [facility], he claims he was given morphine and a benzodiazepine, as a result during my evaluation, patient is drowsy and slightly lethargic and not able to participate in the history taking process. Patient's son and daughter-in-law at the bedside, they claim that the patient has been vomiting almost on a daily basis since admission. He has been mostly on a liquid diet. There is no history of diarrhea. Denies any knowledge of abdominal  pain. Otherwise the patient is comfortable and not in any distress.  Assessment/Plan:  Acute on chronic renal failure Baseline crt ~1.2 - likely due to hypoperfusion due to factors detailed below - agree w/ hydration as prescribed by Cards - at this time there is not acute indication for HD - check renal US  Acute hepatitis / transaminitis / coagulopathy Felt to be due to passive hepatic congestion due to severe R heart failure w/ probable acute exacerbation related to hypoperfusion due to volume depletion  Acute on chronic diastolic CHF 11/22 TTE >> mod LVH, LVEF 50%, RV dilation, RA dilation, LA dilation, mod-severe AS, mild AI, IVC dilation, mod to severe RV dysfunction - as per Cards to have PICC placed to allow CVP and co-ox monitoring  Mod-severe Aortic stenosis valve area 1.05 via cath Dec 2012 - clearly not a candidate for tx/intervention  A fib/flutter S/p RFCA - no anticoag due to hx of GIB - rate controlled  Lethargy/altered mental status Pt is arousable but confused - likely due to uremia - cont to hydrate - r/o B12 or folate deficiencies - r/o UTI  COPD Well compensated at present  Pulm HTN / R Heart failure RV severely dilated and hypokinetic via echo 11/22 - pt is volume/preload dependent - hydrating - BP improved   CAD Status post balloon angioplasty, 1987, 4-vessel CABG, 1997, Bare metal stenting, SVG-RCA and SVG-LAD/diagonal graft, 06/2011, BMS stent to mid body of SVG to OM2 12/18/2011 - Cardiology is following   syncope Due to volume depeletion in setting of hypotension + RHF + AoS  HTN Not an active issue at this time  Dyslipidemia  thrombyocytopenia No spontaneous bleeding noted - follow trend  Code Status: DNR  Disposition Plan: heparin  Consultants: PCCM Cards - Pax  Procedures: none  Antibiotics: none  DVT prophylaxis: Sub Q heparin   HPI/Subjective: Pt is awake but confused during my eval.  He is unable to provide a reliable hx.      Objective: Blood pressure 114/49, pulse 53, temperature 97.9 F (36.6 C), temperature source Oral, resp. rate 15, SpO2 99.00%.  Intake/Output Summary (Last 24 hours) at 06/08/12 1532 Last data filed at 06/08/12 1400  Gross per 24 hour  Intake   2665 ml  Output    735 ml  Net   1930 ml     Exam: General: No acute respiratory distress - jaundiced Lungs: Clear to auscultation bilaterally without wheezes or crackles Cardiovascular: irreg irreg but rate controlled w/o gallop or rub Abdomen: periumbilical hernia is reducible, nontender, nondistended, soft, bowel sounds positive, no rebound, no ascites, no appreciable mass Extremities: No significant cyanosis, clubbing, or edema bilateral lower extremities  Data Reviewed: Basic Metabolic Panel:  Lab 06/08/12 4540 06/07/12 1555  NA 138 136  K 4.9 5.3*  CL 97 96  CO2 21 21  GLUCOSE 100* 88  BUN 99* 89*  CREATININE 4.07* 3.86*  CALCIUM 8.5 8.8  MG -- --  PHOS -- --   Liver Function Tests:  Lab 06/08/12 0905 06/07/12 1555  AST 1504* 1595*  ALT 1312* 1270*  ALKPHOS 129* 125*  BILITOT 3.1* 3.2*  PROT 6.6 6.8  ALBUMIN 3.2* 3.3*    Lab 06/07/12 1555  AMMONIA 36   CBC:  Lab 06/08/12 0905 06/07/12 1555  WBC 6.4 7.5  NEUTROABS -- --  HGB 10.7* 10.9*  HCT 36.0* 36.4*  MCV 84.3 84.5  PLT 54* 68*   BNP (last 3 results)  Basename 12/21/11 0520 12/20/11 0545 12/19/11 0555  PROBNP 4367.0* 5208.0* 6735.0*   CBG:  Lab 06/07/12 1407  GLUCAP 87    Recent Results (from the past 240 hour(s))  MRSA PCR SCREENING     Status: Normal   Collection Time   06/07/12  2:42 PM      Component Value Range Status Comment   MRSA by PCR NEGATIVE  NEGATIVE Final      Studies:  Recent x-ray studies have been reviewed in detail by the Attending Physician  Scheduled Meds:  Reviewed in detail by the Attending Physician   Lonia Blood, MD [facility] Office  [phone] Pager [phone]  On-Call/Text  Page:      [email or web address]      password [email or web address]  If 7PM-7AM, please contact night-coverage [email or web address] Password [email or web address] 06/08/2012, 3:32 PM   LOS: 1 day

## 2012-06-08 NOTE — Progress Notes (Signed)
eLink Physician-Brief Progress Note Patient Name: Juan Wolf DOB: 1921-10-20 MRN: [id number]  Date of Service  06/08/2012   HPI/Events of Note   Needs DVT proph  eICU Interventions  Heparin sq ordered   Intervention Category Intermediate Interventions: Best-practice therapies (e.g. DVT, beta blocker, etc.)  Shan Levans 06/08/2012, 10:35 PM

## 2012-06-08 NOTE — Progress Notes (Addendum)
INITIAL ADULT NUTRITION ASSESSMENT Date: 06/08/2012   Time: 10:46 AM  Reason for Assessment: Nutrition Risk Report (MST=2)  INTERVENTION:  Resource Breeze PO TID to maximize oral intake, each supplement provides 250 kcal and 9 grams of protein.   DOCUMENTATION CODES Per approved criteria  -Not Applicable   ASSESSMENT: Male 78 y.o.  Dx: Transferred from [facility] hospital with worsening acute on chronic renal failure and acute hepatitis  Hx:  Past Medical History  Diagnosis Date  . Umbilical hernia without mention of obstruction or gangrene   . Other and unspecified hyperlipidemia   . COPD (chronic obstructive pulmonary disease)   . Hypertension   . Cramp of limb   . Atrial flutter     s/p RFCA   . Atrial fibrillation     tikosyn rx d/c in setting of VT (possibly ischemia mediated though)  . CAD (coronary artery disease)     a) Status post balloon angioplasty, 1987, b) 4-vessel CABG, 1997, c) Bare metal stenting, SVG-RCA and SVG-LAD/diagonal graft, 06/2011, d) BMS stent to mid body of SVG to OM2 12/18/2011   . Aortic stenosis 06/05/2011  . Chronic kidney disease     CKD  . Complication of anesthesia     " The doctor told me not to let anyone put me to sleep"  . Myocardial infarction   . GERD (gastroesophageal reflux disease)   . Arthritis   . Chronic diastolic heart failure   . Ventricular tachycardia     12/2011  . Pulmonary HTN     Past Surgical History  Procedure Date  . Appendectomy   . Back surgery   . Cholecystectomy   . Hernia repair   . Cardiac catheterization 06/19/2011    Moderate pulmonary hypertension, moderate AS (valve area: 1.05), severe 3 vessel CAD. 90% stensis in both SVG to LAD/diagonal and SVG to RCA. Patent SVG to OM.   Marland Kitchen Coronary angioplasty 06/19/2011    BMS to both SVG to RCA and SVG to LAD/diagonal  . Coronary artery bypass graft     Related Meds:  Scheduled Meds:   . aspirin EC  325 mg Oral Daily  . docusate sodium  100 mg Oral BID    . gabapentin  200 mg Oral Daily  . heparin  5,000 Units Subcutaneous Q8H  . sodium chloride  3 mL Intravenous Q12H  . [COMPLETED] sodium polystyrene  30 g Oral Once  . [DISCONTINUED] metoprolol  12.5 mg Oral BID   Continuous Infusions:   . sodium chloride 1,000 mL (06/07/12 1815)   PRN Meds:.acetaminophen, acetaminophen, albuterol, HYDROcodone-acetaminophen, nitroGLYCERIN, ondansetron (ZOFRAN) IV, ondansetron, zolpidem   Ht:   Ht Readings from Last 3 Encounters:  06/04/12 5\' 7"  (1.702 m)  04/02/12 5\' 7"  (1.702 m)  12/31/11 5\' 7"  (1.702 m)   Wt:  83.19 kg (185 lb)  Ideal Wt: 67.3 kg % Ideal Wt: 124%  Usual Wt:  Wt Readings from Last 10 Encounters:  06/04/12 185 lb (83.915 kg)  04/02/12 194 lb (87.998 kg)  12/31/11 192 lb 12.8 oz (87.454 kg)  12/21/11 186 lb 1.6 oz (84.414 kg)  12/21/11 186 lb 1.6 oz (84.414 kg)  12/21/11 186 lb 1.6 oz (84.414 kg)  09/20/11 199 lb (90.266 kg)  09/12/11 194 lb 4.8 oz (88.134 kg)  09/03/11 204 lb (92.534 kg)  07/04/11 202 lb (91.627 kg)   % Usual Wt: 95%  BMI=28.7  Food/Nutrition Related Hx: 5% weight loss in the past 2 months.  Labs:  CMP  Component Value Date/Time   NA 138 06/08/2012 0905   K 4.9 06/08/2012 0905   CL 97 06/08/2012 0905   CO2 21 06/08/2012 0905   GLUCOSE 100* 06/08/2012 0905   BUN 99* 06/08/2012 0905   CREATININE 4.07* 06/08/2012 0905   CALCIUM 8.5 06/08/2012 0905   PROT 6.6 06/08/2012 0905   ALBUMIN 3.2* 06/08/2012 0905   AST 1504* 06/08/2012 0905   ALT 1312* 06/08/2012 0905   ALKPHOS 129* 06/08/2012 0905   BILITOT 3.1* 06/08/2012 0905   GFRNONAA 12* 06/08/2012 0905   GFRAA 14* 06/08/2012 0905    CBG (last 3)   Basename 06/07/12 1407  GLUCAP 87    Sodium  Date/Time Value Range Status  06/08/2012  9:05 AM 138  135 - 145 mEq/L Final  06/07/2012  3:55 PM 136  135 - 145 mEq/L Final  12/21/2011  5:20 AM 137  135 - 145 mEq/L Final    Potassium  Date/Time Value Range Status  06/08/2012  9:05  AM 4.9  3.5 - 5.1 mEq/L Final  06/07/2012  3:55 PM 5.3* 3.5 - 5.1 mEq/L Final  12/21/2011  5:20 AM 3.6  3.5 - 5.1 mEq/L Final    No results found for this basename: phos    Magnesium  Date/Time Value Range Status  09/11/2011  5:22 AM 2.5  1.5 - 2.5 mg/dL Final  9/60/4540  9:81 PM 2.2  1.5-2.5 mg/dL Final  1/91/4782  9:56 PM 2.5   Final     Intake/Output Summary (Last 24 hours) at 06/08/12 1052 Last data filed at 06/08/12 0800  Gross per 24 hour  Intake   1955 ml  Output    325 ml  Net   1630 ml    Diet Order: Sodium Restricted, 2 gm   IVF:    sodium chloride Last Rate: 1,000 mL (06/07/12 1815)    Estimated Nutritional Needs:   Kcal: 1750-1900 Protein: 90-110 gm Fluid: 1.8-2 liters  Patient was sleeping during RD visit.  Spoke with patient's family regarding nutrition history.  Patient has been eating very poorly for the past 2 weeks due to acute illness.  Has lost some weight.  Usually eats very well per family, but only ate a few bites of oatmeal and drank juice for breakfast today.  Family reports that patient usually weighs himself daily due to history of heart failure.  Patient is at nutrition risk given poor intake with recent weight loss.  NUTRITION DIAGNOSIS: -Inadequate oral intake related to poor appetite with acute illness as evidenced by 5% weight loss and minimal intake of meals.  MONITORING/EVALUATION(Goals): Goal:  Intake to meet >90% of estimated nutrition needs. Monitor:  PO intake, labs, weight trend.  EDUCATION NEEDS: -Education not appropriate at this time   Joaquin Courts, RD, LDN, CNSC Pager# [phone] After Hours Pager# [phone]  06/08/2012, 10:46 AM

## 2012-06-08 NOTE — Progress Notes (Signed)
Patient: Juan Wolf / Admit Date: 06/07/2012 / Date of Encounter: 06/08/2012, 8:27 AM   Subjective  Mouth is dry. Denies any complaints. Not completely oriented.   Objective   Telemetry: currently NSR  Intermittent episodes of irregular HR (?afib), including bradycardia/pauses. Difficult to discern P waves at times. PVCs/PACs noted. Physical Exam: Filed Vitals:   06/08/12 0741  BP: 121-53  Pulse: 62  Temp: 97.6 F (36.4 C)  Resp: 11  POX 96% General: Elderly WM in no acute distress, sleepy appearing Head: Normocephalic, atraumatic, slightly yellowed appearance to skin tone, no xanthomas, nares are without discharge. Neck: JVD not elevated. Lungs: Decreased BS but now wheezes rales or rhonchi. Breathing is unlabored. Heart: Distant heart sounds, RRR  S1 S2. Very soft SEM. No rubs or gallops.  Abdomen: Soft, non-tender, non-distended with normoactive bowel sounds. No hepatomegaly. No rebound/guarding. Msk:  Tone appear normal for age. Extremities: Chronic venous stasis skin changes. No clubbing or cyanosis. No edema.  Distal pedal pulses are in tact equally bilaterally.. Neuro: Alert and oriented close to a few details - thought it was November 2003, thinks he's at [facility], able to state name. Moves all extremities spontaneously and follows commands. Psych:  Flat lethargic affect.    Intake/Output Summary (Last 24 hours) at 06/08/12 0827 Last data filed at 06/08/12 0600  Gross per 24 hour  Intake   1705 ml  Output    325 ml  Net   1380 ml    Inpatient Medications:    . aspirin EC  325 mg Oral Daily  . docusate sodium  100 mg Oral BID  . gabapentin  200 mg Oral Daily  . heparin  5,000 Units Subcutaneous Q8H  . sodium chloride  3 mL Intravenous Q12H  . [COMPLETED] sodium polystyrene  30 g Oral Once  . [DISCONTINUED] metoprolol  12.5 mg Oral BID    Labs:  [facility] 06/07/12 1555  NA 136  K 5.3*  CL 96  CO2 21  GLUCOSE 88  BUN 89*  CREATININE 3.86*   CALCIUM 8.8  MG --  PHOS --    Basename 06/07/12 1555  AST 1595*  ALT 1270*  ALKPHOS 125*  BILITOT 3.2*  PROT 6.8  ALBUMIN 3.3*    Basename 06/07/12 1555  WBC 7.5  NEUTROABS --  HGB 10.9*  HCT 36.4*  MCV 84.5  PLT 68*    Radiology/Studies:   Dg Chest Port 1 View 06/07/2012  *RADIOLOGY REPORT*  Clinical Data: Congestive heart failure.  PORTABLE CHEST - 1 VIEW  Comparison: Chest 06/04/2012.  Findings: Bilateral pleural effusions and basilar airspace disease persist.  The patient's right effusion has worsened.  There is cardiomegaly and interstitial edema.  IMPRESSION: Increased interstitial edema.  Bilateral pleural effusions persist and the right effusion is larger than on the prior study.   Original Report Authenticated By: Holley Dexter, M.D.      Assessment and Plan  78 y.o. male w/ PMHx significant for CAD (s/p CABG and subsequent PCI), A.Fib/flutter (s/p RFCA, no anticoag 2/2 GI bleed), Mod AS, Chronic Diastolic CHF, VT, Pulm HTN, HTN, HLD, and CKD who admitted to [facility] initially with SOB thought related to CHF/AS - while there had syncope for which he was given IVF - then diuresed with resultant liver/renal function thus transferred to [facility]  1. Hypotension - stable 2. Acute on chronic renal failure  3. Shock liver with resultant autoanticoagulation 4. Chronic Diastolic CHF - severe RV dysfunction/AS makes him volume dependent  5. Syncope  6. Aortic stenosis, moderate  - not a candidate for TAVR 7. Altered Mental Status  8. Acute delirium  9. Paroxysmal atrial fibrillation, currently in sinus bradycardia, no anticoag due to h/o GI bleed  10. Anemia/thrombocytopenia 11. CAD s/p CABG w/ subsequent PCIs  12. Hypertension  13. Hyperlipidemia  14. Recent GI bleed  15. Physical deconditioning  16. DNR/DNI  Mr. Norlander has multi-organ system failure in the setting of volume depletion. With his severe RV dysfunction and AS he is volume dependent. Getting  steady hydration for supportive care - will need to follow closely given his CHF. ?PICC to be placed. Off AV nodal agents due to bradycardia, not on antiHTN agents due to BP. Consider decreasing or holding ASA/heparin SQ given anemia/TCP and elevated INR. Will order daily weights. MD to follow.  Signed, Ronie Spies PA-C  Patient seen and examined  Agree with findings of D Dunn.  Patient appears comfortable  Lungs CTA  Cardiac RRR III/Vi systolic murmur at base  Ext No edema.  2+ pulses BP is stable  Remains positive with little urine output No  neew recommendations.  Prognosis is guarded.

## 2012-06-08 NOTE — Progress Notes (Signed)
06/08/12 1100  Clinical Encounter Type  Visited With Patient and family together  Visit Type Initial   I visited with the son and daughter-n-law. Patient was sleeping. Their pastor stopped by earlier. Veryl Speak

## 2012-06-08 NOTE — Progress Notes (Signed)
eLink Physician-Brief Progress Note Patient Name: Juan Wolf DOB: 01-04-1922 MRN: [id number]  Date of Service  06/08/2012   HPI/Events of Note   Heparin ordered for DVT propy but plts of 54  eICU Interventions  Plan: D/C subq heparin SCDs for DVT propy   Intervention Category Intermediate Interventions: Best-practice therapies (e.g. DVT, beta blocker, etc.)  DETERDING,ELIZABETH 06/08/2012, 11:26 PM

## 2012-06-09 ENCOUNTER — Encounter (HOSPITAL_COMMUNITY): Payer: Self-pay

## 2012-06-09 ENCOUNTER — Inpatient Hospital Stay (HOSPITAL_COMMUNITY): Payer: Medicare Other

## 2012-06-09 LAB — RENAL FUNCTION PANEL
CO2: 21 mEq/L (ref 19–32)
Chloride: 99 mEq/L (ref 96–112)
GFR calc Af Amer: 15 mL/min — ABNORMAL LOW (ref >90)
Glucose, Bld: 115 mg/dL — ABNORMAL HIGH (ref 70–99)
Phosphorus: 7.2 mg/dL — ABNORMAL HIGH (ref 2.3–4.6)
Potassium: 3.9 mEq/L (ref 3.5–5.1)
Sodium: 137 mEq/L (ref 135–145)

## 2012-06-09 LAB — CBC
HCT: 34.3 % — ABNORMAL LOW (ref 39.0–52.0)
Hemoglobin: 10.4 g/dL — ABNORMAL LOW (ref 13.0–17.0)
RBC: 4.08 MIL/uL — ABNORMAL LOW (ref 4.22–5.81)
RDW: 18 % — ABNORMAL HIGH (ref 11.5–15.5)
WBC: 6.1 10*3/uL (ref 4.0–10.5)

## 2012-06-09 LAB — HEPATIC FUNCTION PANEL
ALT: 1153 U/L — ABNORMAL HIGH (ref 0–53)
AST: 1147 U/L — ABNORMAL HIGH (ref 0–37)
Albumin: 3 g/dL — ABNORMAL LOW (ref 3.5–5.2)
Total Bilirubin: 2 mg/dL — ABNORMAL HIGH (ref 0.3–1.2)

## 2012-06-09 LAB — URINE CULTURE
Colony Count: NO GROWTH
Culture: NO GROWTH

## 2012-06-09 MED ORDER — FLUTICASONE PROPIONATE 50 MCG/ACT NA SUSP: 2.0000 | Freq: Every day | NASAL | Status: DC | PRN

## 2012-06-09 MED ORDER — GABAPENTIN 100 MG PO CAPS
200.0000 mg | ORAL_CAPSULE | Freq: Every day | ORAL | Status: DC
  Administered 2012-06-09 – 2012-06-16 (×8): 200 mg via ORAL
  Filled 2012-06-09 (×9): qty 2

## 2012-06-09 NOTE — Progress Notes (Signed)
Pt transferred from 2100, pt lethargic but arousable  and is a/o x4, pt is total care, pt stable, will monitor

## 2012-06-09 NOTE — Progress Notes (Addendum)
[facility] Progress Note [place] TEAM 1 - Stepdown/ICU TEAM   Juan Wolf JYN:[id number] DOB: Jul 30, 1921 DOA: 06/07/2012 PCP: Ignatius Specking., MD  Brief narrative: 78 year old male with a past medical history of coronary artery disease status post CABG and subsequent PCI , history of atrial fibrillation not on anticoagulation secondary to gastrointestinal bleeding, moderate to severe aortic stenosis, diastolic heart failure who has been transferred from [facility] evaluation of worsening acute on chronic renal failure and worsening transaminitis. Patient was admitted to [facility] on 06/04/2012 for shortness of breath, while in the waiting area of the hospital he had a syncopal episode. He was then brought to the emergency room, where he was found to be hypotensive and as a result he was given IV fluids. He was subsequently admitted, he was thought to have acute on chronic diastolic heart failure and started on gentle diuresis. Unfortunately during his hospital course, his renal function continued to worsen, his liver enzymes continued to worsen as well. Family claims that all of his diuretics were stopped yesterday and patient was then started on intravenous fluids. Because of ongoing worsening of his renal and liver function, patient was transferred to [facility] intensive care unit, he was evaluated by PCCM and subsequently downgraded and tried hospitalist was asked to admit this patient.  Just prior to the patient's transfer from [facility], he claims he was given morphine and a benzodiazepine, as a result during my evaluation, patient is drowsy and slightly lethargic and not able to participate in the history taking process. Patient's son and daughter-in-law at the bedside, they claim that the patient has been vomiting almost on a daily basis since admission. He has been mostly on a liquid diet. There is no history of diarrhea. Denies any knowledge of abdominal  pain. Otherwise the patient is comfortable and not in any distress.  Assessment/Plan:  Acute on chronic renal failure Baseline crt ~1.2 - likely due to hypoperfusion due to factors detailed below - agree w/ hydration as prescribed by Cards - at this time there is not acute indication for HD - normal renal US  Acute hepatitis / transaminitis / coagulopathy Felt to be due to passive hepatic congestion due to severe R heart failure w/ probable acute exacerbation related to hypoperfusion due to volume depletion  Chronic diastolic CHF 11/22 TTE >> mod LVH, LVEF 50%, RV dilation, RA dilation, LA dilation, mod-severe AS, mild AI, IVC dilation, mod to severe RV dysfunction - as per Cards- lasix on hold- he is dehydrated and receiving IVF  Mod-severe Aortic stenosis valve area 1.05 via cath Dec 2012 - clearly not a candidate for tx/intervention  A fib/flutter S/p RFCA - no anticoag due to hx of GIB - rate controlled  Lethargy/altered mental status Less confused today - likely due to uremia - cont to hydrate per cards- normal B12 - await folate level - no UTI noted  COPD Well compensated at present  Pulm HTN / R Heart failure RV severely dilated and hypokinetic via echo 11/22 - pt is volume/preload dependent - hydrating - BP improved   CAD Status post balloon angioplasty, 1987, 4-vessel CABG, 1997, Bare metal stenting, SVG-RCA and SVG-LAD/diagonal graft, 06/2011, BMS stent to mid body of SVG to OM2 12/18/2011 - Cardiology is following   syncope Due to volume depeletion in setting of hypotension + RHF + AoS  HTN Not an active issue at this time  Dyslipidemia  thrombyocytopenia No spontaneous bleeding noted - follow trend  Code  Status: DNR  Disposition Plan: transfer to tele floor- pt eval  Consultants: PCCM Cards - [place]  Procedures: none  Antibiotics: none  DVT prophylaxis: Sub Q heparin   HPI/Subjective: Pt is awake alert- mild confusion noted. No complaints.     Objective: Blood pressure 106/65, pulse 59, temperature 97.3 F (36.3 C), temperature source Oral, resp. rate 14, SpO2 99.00%.  Intake/Output Summary (Last 24 hours) at 06/09/12 1506 Last data filed at 06/09/12 1300  Gross per 24 hour  Intake   1250 ml  Output    830 ml  Net    420 ml     Exam: General: No acute respiratory distress - jaundiced Lungs: Clear to auscultation bilaterally without wheezes or crackles Cardiovascular: irreg irreg but rate controlled w/o gallop or rub Abdomen: periumbilical hernia is reducible, nontender, nondistended, soft, bowel sounds positive, no rebound, no ascites, no appreciable mass Extremities: No significant cyanosis, clubbing, or edema bilateral lower extremities  Data Reviewed: Basic Metabolic Panel:  Lab 06/09/12 4098 06/08/12 0905 06/07/12 1555  NA 137 138 136  K 3.9 4.9 5.3*  CL 99 97 96  CO2 21 21 21   GLUCOSE 115* 100* 88  BUN 102* 99* 89*  CREATININE 3.90* 4.07* 3.86*  CALCIUM 8.0* 8.5 8.8  MG -- -- --  PHOS 7.2* -- --   Liver Function Tests:  Lab 06/09/12 0910 06/09/12 0425 06/08/12 0905 06/07/12 1555  AST 1147* -- 1504* 1595*  ALT 1153* -- 1312* 1270*  ALKPHOS 128* -- 129* 125*  BILITOT 2.0* -- 3.1* 3.2*  PROT 6.3 -- 6.6 6.8  ALBUMIN 3.0* 2.9* 3.2* 3.3*    Lab 06/07/12 1555  AMMONIA 36   CBC:  Lab 06/09/12 0425 06/08/12 0905 06/07/12 1555  WBC 6.1 6.4 7.5  NEUTROABS -- -- --  HGB 10.4* 10.7* 10.9*  HCT 34.3* 36.0* 36.4*  MCV 84.1 84.3 84.5  PLT 41* 54* 68*   BNP (last 3 results)  Basename 12/21/11 0520 12/20/11 0545 12/19/11 0555  PROBNP 4367.0* 5208.0* 6735.0*   CBG:  Lab 06/07/12 1407  GLUCAP 87    Recent Results (from the past 240 hour(s))  MRSA PCR SCREENING     Status: Normal   Collection Time   06/07/12  2:42 PM      Component Value Range Status Comment   MRSA by PCR NEGATIVE  NEGATIVE Final      Studies:  Recent x-ray studies have been reviewed in detail by the Attending  Physician  Scheduled Meds:  Reviewed in detail by the Attending Physician   NOTE CORRECTED TO REFLECT SIGNATURE OF DR. SAIMA RIZWAN WHO SAY PT TODAY AND COMPOSED THIS NOTE.  DR. Sharon Seller NOT INVOLVED IN CARE OF THIS PATIENT ON 06/09/2012.  If 7PM-7AM, please contact night-coverage [email or web address] Password [email or web address] 06/09/2012, 3:06 PM   LOS: 2 days

## 2012-06-09 NOTE — Progress Notes (Signed)
Patient ID: Juan Wolf, male   DOB: [birth year], 78 y.o.   MRN: [id number]   SUBJECTIVE:  The patient is lethargic but responsive. He is not having any significant pain. This patient has a multitude of issues. It is very difficult to know exactly why he deteriorated. When he first presented to [facility] he came in with a syncopal episode. It has been very nicely noted in this record that it was difficult to decide what his volume status was. He did actually have pleural effusions. I'm not sure will be able to answers the question. For now we will just have to proceed with managing him on a daily basis to see if he can be stabilized. It is appropriate for him to be DO NOT RESUSCITATE. However, he has normal mental status and and acceptable lifestyle before this admission.   Filed Vitals:   06/09/12 0421 06/09/12 0500 06/09/12 0600 06/09/12 0700  BP:  106/52 114/60 113/55  Pulse:  62 65 67  Temp: 97.3 F (36.3 C)     TempSrc: Oral     Resp:  15 15 15   SpO2:  90% 99% 98%    Intake/Output Summary (Last 24 hours) at 06/09/12 0736 Last data filed at 06/09/12 0700  Gross per 24 hour  Intake   2150 ml  Output    690 ml  Net   1460 ml    LABS: Basic Metabolic Panel:  Basename 06/09/12 0425 06/08/12 0905  NA 137 138  K 3.9 4.9  CL 99 97  CO2 21 21  GLUCOSE 115* 100*  BUN 102* 99*  CREATININE 3.90* 4.07*  CALCIUM 8.0* 8.5  MG -- --  PHOS 7.2* --   Liver Function Tests:  Basename 06/09/12 0425 06/08/12 0905 06/07/12 1555  AST -- 1504* 1595*  ALT -- 1312* 1270*  ALKPHOS -- 129* 125*  BILITOT -- 3.1* 3.2*  PROT -- 6.6 6.8  ALBUMIN 2.9* 3.2* --   No results found for this basename: LIPASE:2,AMYLASE:2 in the last 72 hours CBC:  Basename 06/09/12 0425 06/08/12 0905  WBC 6.1 6.4  NEUTROABS -- --  HGB 10.4* 10.7*  HCT 34.3* 36.0*  MCV 84.1 84.3  PLT 41* 54*   Cardiac Enzymes: No results found for this basename: CKTOTAL:3,CKMB:3,CKMBINDEX:3,TROPONINI:3 in the last  72 hours BNP: No components found with this basename: POCBNP:3 D-Dimer: No results found for this basename: DDIMER:2 in the last 72 hours Hemoglobin A1C: No results found for this basename: HGBA1C in the last 72 hours Fasting Lipid Panel: No results found for this basename: CHOL,HDL,LDLCALC,TRIG,CHOLHDL,LDLDIRECT in the last 72 hours Thyroid Function Tests: No results found for this basename: TSH,T4TOTAL,FREET3,T3FREE,THYROIDAB in the last 72 hours  RADIOLOGY: US Renal  06/08/2012  *RADIOLOGY REPORT*  Clinical Data: Acute renal failure.  RENAL/URINARY TRACT ULTRASOUND COMPLETE  Comparison:  Previous abdomen ultrasound.  Findings:  Right Kidney:  Normal, measuring 11.2 cm in length.  No hydronephrosis.  Left Kidney:  Normal, measuring 10.1 cm in length.  No hydronephrosis.  Bladder:  Empty, with a Foley catheter in place.  IMPRESSION: Normal examination.   Original Report Authenticated By: Beckie Salts, M.D.    Dg Chest Port 1 View  06/07/2012  *RADIOLOGY REPORT*  Clinical Data: Congestive heart failure.  PORTABLE CHEST - 1 VIEW  Comparison: Chest 06/04/2012.  Findings: Bilateral pleural effusions and basilar airspace disease persist.  The patient's right effusion has worsened.  There is cardiomegaly and interstitial edema.  IMPRESSION: Increased interstitial edema.  Bilateral pleural  effusions persist and the right effusion is larger than on the prior study.   Original Report Authenticated By: Holley Dexter, M.D.     PHYSICAL EXAM  Patient is lethargic. There is no jugulovenous distention. Lungs reveal rhonchi and rales. Cardiac exam reveals S1 and S2. There is a 3/6 crescendo decrescendo systolic murmur consistent with aortic stenosis. The abdomen is soft. He has mechanical support on his legs. There is some edema.  TELEMETRY: I have personally reviewed telemetry today June 09, 2012. There is normal sinus rhythm.   ASSESSMENT AND PLAN:   HYPERTENSION    At this time his pressure  is not elevated. In fact it is borderline low. He is not currently receiving any medications that would lower his blood pressure.    CAD (coronary artery disease)     Coronary disease is stable. No change in therapy.   Aortic stenosis     The patient does have significant aortic stenosis. There have been many discussions over time as to whether or not he could have an intervention for this. He is not a surgical candidate. It has been felt most recently that he might be a candidate for TAVR, but only if his other multiple medical problems were reasonably stabilize. In addition, the finding of severe right ventricular dysfunction is relatively new. Certainly at this time there is no possibility that he could have TAVR.   Acute on chronic diastolic CHF (congestive heart failure), NYHA class 1    Management of his heart failure is quite difficult at this time. There have been many factors with the current illness that suggested that he needed more volume. However, paralleling this, he has had pleural effusions and continues to have effusions. During the day yesterday he received IV fluid. As of today I feel we have to change directions somewhat. His creatinine is improved slightly. I have chosen to turn his IV fluid to keep open and to watch him over the next 24 hours. Then depending on his output and his physical exam and his renal function, decisions can be made as to whether or not resuming careful diuresis or giving more fluid is more appropriate. I will also repeat his chest x-ray today.   Renal insufficiency    The patient's renal function became markedly worse with the current admission to the outside hospital. It is possible that there may be a component of ATN related to the hypotension that he had with syncope. Today I will watch his renal function leading his volume status as it is.    Syncope    The patient presented to the outside hospital with syncope. I do not think that we will be able to  figure out what caused this event.    Acute on chronic renal failure    Creatinine today is slightly better. I have outlined the plan above. Cut his IV fluid to keep open and watch his renal function today.   Shock liver    We will follow his LFTs. The exact reason for shock liver again is not clear. It may have been related to his initial presentation with hypotension with syncope.   Right heart failure    The patient has right ventricular dysfunction by echo.   [name] 06/09/2012 7:36 AM

## 2012-06-10 DIAGNOSIS — I359 Nonrheumatic aortic valve disorder, unspecified: Secondary | ICD-10-CM

## 2012-06-10 DIAGNOSIS — R0602 Shortness of breath: Secondary | ICD-10-CM

## 2012-06-10 DIAGNOSIS — R252 Cramp and spasm: Secondary | ICD-10-CM

## 2012-06-10 DIAGNOSIS — R55 Syncope and collapse: Secondary | ICD-10-CM

## 2012-06-10 DIAGNOSIS — I5023 Acute on chronic systolic (congestive) heart failure: Secondary | ICD-10-CM

## 2012-06-10 LAB — BASIC METABOLIC PANEL
BUN: 101 mg/dL — ABNORMAL HIGH (ref 6–23)
GFR calc non Af Amer: 15 mL/min — ABNORMAL LOW (ref >90)
Glucose, Bld: 101 mg/dL — ABNORMAL HIGH (ref 70–99)
Potassium: 3.2 mEq/L — ABNORMAL LOW (ref 3.5–5.1)

## 2012-06-10 MED ORDER — FUROSEMIDE 10 MG/ML IJ SOLN
40.0000 mg | Freq: Two times a day (BID) | INTRAMUSCULAR | Status: AC
  Administered 2012-06-10 – 2012-06-13 (×8): 40 mg via INTRAVENOUS
  Filled 2012-06-10 (×9): qty 4

## 2012-06-10 NOTE — Progress Notes (Signed)
Palliative Medicine Team consult for goals of care requested by Dr Kirke Corin, confirmed with Dr Tat spoke with patient at bedside who stated "I don't know it seems like the bottom just fell out-there's not much else they can do"; patient stated he is not very comfortable c/o low back pain per staff RN coccyx is reddened has given pt ordered tylenol and re-positioned- patient agreeable to talking with family and PMT; this writer also requested unit Chaplain stop by for support-  Contacted and spoke with patient's d-i-l Juan Wolf [phone] who informed her husband/patient's son Juan Wolf is trying to arrange schedule to come to the hospital- family lives in [place] shared the patient was independent and living alone prior to this admit; the family has had multiple losses this past year as her mother and patient's wife have both passed and have had palliative involved in their care; emotional support offered.   PMT meeting scheduled per family request for today Wednesday 06/09/12 @ 5-5:30 pm.  Valente David, RN 06/10/2012, 12:35 PM Palliative Medicine Team RN Liaison [phone]

## 2012-06-10 NOTE — Progress Notes (Signed)
Patient had questionable 10 beats V.tach, pt. Was asleep at the time , no C/O chest pain, v/s stable, Cardiology PA notified, no order given will continue to monitor patient.

## 2012-06-10 NOTE — Progress Notes (Signed)
Patient Name: Juan Wolf Date of Encounter: 06/10/2012     Active Problems:  HYPERTENSION  COPD  CAD (coronary artery disease)  Aortic stenosis  Acute on chronic diastolic CHF (congestive heart failure), NYHA class 1  Renal insufficiency  Anemia  Atrial fibrillation  Syncope  Hypotension  Acute on chronic renal failure  Shock liver  Right heart failure    SUBJECTIVE:He looks ill with mental status changes.    OBJECTIVE  Filed Vitals:   06/09/12 1600 06/09/12 1639 06/09/12 2225 06/10/12 0505  BP:  122/60 119/39 114/43  Pulse: 63 58 73 64  Temp:  97.6 F (36.4 C) 98 F (36.7 C) 98.5 F (36.9 C)  TempSrc:  Oral Oral Oral  Resp: 19 24 20 18   Height: 5\' 9"  (1.753 m)     Weight: 91.4 kg (201 lb 8 oz)   91.037 kg (200 lb 11.2 oz)  SpO2: 98% 100% 100% 96%    Intake/Output Summary (Last 24 hours) at 06/10/12 0900 Last data filed at 06/09/12 2226  Gross per 24 hour  Intake    140 ml  Output   1400 ml  Net  -1260 ml   Weight change:   PHYSICAL EXAM  General: ill appearing.  Head: Normocephalic, atraumatic, sclera non-icteric, no xanthomas, nares are without discharge.  Neck: + JVD. Lungs: postive bibasilar crackles.  Heart: RRR no s3, s4, or 3/6  SEM at aortic area.  Abdomen:  non-tender, non-distended, BS + x 4.  Extremities: moderate edema.  Neuro: drowsy.  Psych:  Flat affect.   LABS:  Recent Labs  [facility] [place] [facility] 06/09/12 0425 06/08/12 0905   WBC 6.1 6.4   HGB 10.4* 10.7*   HCT 34.3* 36.0*   MCV 84.1 84.3   PLT 41* 54*    Basename 06/08/12 0905  VITAMINB12 >2000*  FOLATE 20.0  FERRITIN 141  TIBC 372  IRON 23*  RETICCTPCT 2.3   Lab 06/10/12 0500 06/09/12 0910 06/09/12 0425 06/08/12 0905  NA 139 -- 137 138  K 3.2* -- 3.9 4.9  CL 101 -- 99 97  CO2 22 -- 21 21  BUN 101* -- 102* 99*  CREATININE 3.37* -- 3.90* 4.07*  CALCIUM 8.2* -- 8.0* 8.5  PROT -- 6.3 -- --  BILITOT -- 2.0* -- --  ALKPHOS -- 128* -- --  ALT -- 1153* -- --  AST --  1147* -- --  AMYLASE -- -- -- --  LIPASE -- -- -- --  GLUCOSE 101* -- 115* 100*   TELE: atrial fibrillation 60-80 bpm, intermittent NSVT, PVCs, couplets  Radiology/Studies:  US Renal  06/08/2012  *RADIOLOGY REPORT*  Clinical Data: Acute renal failure.  RENAL/URINARY TRACT ULTRASOUND COMPLETE  Comparison:  Previous abdomen ultrasound.  Findings:  Right Kidney:  Normal, measuring 11.2 cm in length.  No hydronephrosis.  Left Kidney:  Normal, measuring 10.1 cm in length.  No hydronephrosis.  Bladder:  Empty, with a Foley catheter in place.  IMPRESSION: Normal examination.   Original Report Authenticated By: Beckie Salts, M.D.    Dg Chest Port 1 View  06/09/2012  *RADIOLOGY REPORT*  Clinical Data: Cough and congestion.  PORTABLE CHEST - 1 VIEW  Comparison: 06/07/2012.  Findings: The heart remains enlarged.  There is persistent central vascular congestion, mild interstitial pulmonary edema, bilateral pleural effusions and bibasilar atelectasis.  Overall slight improved aeration may suggest resolving edema.  IMPRESSION: Persistent but slightly improved CHF.   Original Report Authenticated By: Rudie Meyer, M.D.    Dg  Chest Port 1 View  06/07/2012  *RADIOLOGY REPORT*  Clinical Data: Congestive heart failure.  PORTABLE CHEST - 1 VIEW  Comparison: Chest 06/04/2012.  Findings: Bilateral pleural effusions and basilar airspace disease persist.  The patient's right effusion has worsened.  There is cardiomegaly and interstitial edema.  IMPRESSION: Increased interstitial edema.  Bilateral pleural effusions persist and the right effusion is larger than on the prior study.   Original Report Authenticated By: Holley Dexter, M.D.     Current Medications:     . docusate sodium  100 mg Oral BID  . feeding supplement  1 Container Oral TID BM  . gabapentin  200 mg Oral QHS  . sodium chloride  3 mL Intravenous Q12H    ASSESSMENT:  1. Syncope 2. Acute on chronic diastolic CHF 3. Moderately severe AS- peak  to peak gradient 15 mmHg, mean gradient 19 mmHg, area 0.96 cm by 06/13 RHC  - not critical, not deemed candidate for SAVR, TAVR considered after acute medical issues stabilized 4. Acute on CKD, stage IV-V  - baseline Cr 1.1-1.3 in 06/13. Trend 3.86>4.07>3.90>3.37 from transfer to this AM 5. Hypotension 6. COPD  7. Pulmonary hypertension with resultant cor pulmonale 8. CAD s/p CABG & multiple PCIs- stable.  9. Shock liver- evidenced by coagulopathy, transaminitis 10. Paroxysmal atrial fibrillation/flutter- currently in rate-controlled atrial fibrillation. No anticoagulation d/t recent GIB.  11. AMS/delirium- ? Secondary to renal failure/uremia 12. Anemia/thrombocytopenia 13. HTN 14. HLD 15. Failure to thrive/deconditioning 16. DNR  DISCUSSION/PLAN:  Difficult situation. Patient is volume-dependent given underlying severe AS and RV dysfunction. Question if reduced preload with underlying severe AS produced a significant gradient to transiently reduce BP and induce syncope.  Very little progress in terms of diuresis. Multiorgan injury secondary to underperfusion (renal insufficiency/shock liver). Normal renal u/s yesterday. Renal function actually improved today with reduced hydration. Patient is still certainly volume overloaded. Favor continuing KVO fluid and assessing renal function tomorrow. If improved, may re-initiate diuresis. BP has remained stable  today despite RV dysfunction and need for preload. Medicine team following for underlying medical issues as well. After discussing with MD and patient, will plan for palliative care consult given multiple worsening and ongoing medical issues, deconditioning and poor candidacy for invasive interventions.     Signed, R. Hurman Horn, PA-C 06/10/2012, 9:00 AM     I have seen and examined the patient. I agree with the above note with the addition of : the patient is well known to me. Multiple medical issues with CAD, CKD and ?severe AS.  He looks ill and fluid overloaded. I suspect that he had hypotension followed by syncope due to AS and did not tolerate this well with multiorgan hypoperfusion.  I had a discussion with him about management options which are limited at this point. I recommend palliative care consult and consideration of hospice. I will start Lasix 40 mg IV BID to improve his dyspnea.   [name] MD, [facility] 06/10/2012 10:15 AM

## 2012-06-10 NOTE — Progress Notes (Signed)
[facility] PROGRESS NOTE  Juan Wolf:[id number] DOB: 07-21-1921 DOA: 06/07/2012 PCP: Ignatius Specking., MD  Assessment/Plan: Acute on chronic renal failure  Baseline crt ~1.2-1.3 - likely due to hypoperfusion due to factors detailed below -  -at this time there is no acute indication for HD - normal renal US  Acute hepatitis / transaminitis / coagulopathy  - passive hepatic congestion due to severe R heart failure w/ probable acute exacerbation related to hypoperfusion due to volume depletion  Chronic diastolic CHF  11/22 TTE >> mod LVH, LVEF 50%, RV dilation, RA dilation, LA dilation, mod-severe AS, mild AI, IVC dilation, mod to severe RV dysfunction  -Agree with restarting furosemide Mod-severe Aortic stenosis  valve area 1.05 via cath Dec 2012 - clearly not a candidate for tx/intervention  A fib/flutter  S/p RFCA - no anticoag due to hx of GIB - rate controlled  Lethargy/altered mental status  Less confused today - likely due to uremia - cont to hydrate per cards- normal B12 -normal folate level -Urinalysis does not suggest UTI COPD  Well compensated at present  Pulm HTN / R Heart failure/ Cor Pulmonale  RV severely dilated and hypokinetic via echo 11/22 - pt is volume/preload dependent - hydrating - BP improved  CAD  Status post balloon angioplasty, 1987, 4-vessel CABG, 1997, Bare metal stenting, SVG-RCA and SVG-LAD/diagonal graft, 06/2011, BMS stent to mid body of SVG to OM2 12/18/2011 - Cardiology is following  syncope  Due to volume depeletion in setting of hypotension + RHF + AoS  HTN  Not an active issue at this time  Dyslipidemia  thrombyocytopenia  No spontaneous bleeding noted - follow trend  Code Status: DNR  Disposition Plan: transfer to tele floor- pt eval  Consultants:  PCCM  Cards - [place]   Active Problems:  HYPERTENSION  COPD  CAD (coronary artery disease)  Aortic stenosis  Acute on chronic diastolic CHF (congestive heart failure), NYHA class  1  Renal insufficiency  Anemia  Atrial fibrillation  Syncope  Hypotension  Acute on chronic renal failure  Shock liver  Right heart failure    Family Communication:  family at beside Disposition Plan:   Residential hospice      Procedures/Studies: US Renal  06/08/2012  *RADIOLOGY REPORT*  Clinical Data: Acute renal failure.  RENAL/URINARY TRACT ULTRASOUND COMPLETE  Comparison:  Previous abdomen ultrasound.  Findings:  Right Kidney:  Normal, measuring 11.2 cm in length.  No hydronephrosis.  Left Kidney:  Normal, measuring 10.1 cm in length.  No hydronephrosis.  Bladder:  Empty, with a Foley catheter in place.  IMPRESSION: Normal examination.   Original Report Authenticated By: Beckie Salts, M.D.    Dg Chest Port 1 View  06/09/2012  *RADIOLOGY REPORT*  Clinical Data: Cough and congestion.  PORTABLE CHEST - 1 VIEW  Comparison: 06/07/2012.  Findings: The heart remains enlarged.  There is persistent central vascular congestion, mild interstitial pulmonary edema, bilateral pleural effusions and bibasilar atelectasis.  Overall slight improved aeration may suggest resolving edema.  IMPRESSION: Persistent but slightly improved CHF.   Original Report Authenticated By: Rudie Meyer, M.D.    Dg Chest Port 1 View  06/07/2012  *RADIOLOGY REPORT*  Clinical Data: Congestive heart failure.  PORTABLE CHEST - 1 VIEW  Comparison: Chest 06/04/2012.  Findings: Bilateral pleural effusions and basilar airspace disease persist.  The patient's right effusion has worsened.  There is cardiomegaly and interstitial edema.  IMPRESSION: Increased interstitial edema.  Bilateral pleural effusions persist and the right effusion is  larger than on the prior study.   Original Report Authenticated By: Holley Dexter, M.D.          Subjective: Patient was a little but more short of breath this morning. Denies any chest pain, nausea, vomiting, abdominal pain, dysuria. No fevers or chills.  Objective: Filed Vitals:    06/09/12 1639 06/09/12 2225 06/10/12 0505 06/10/12 1436  BP: 122/60 119/39 114/43 114/50  Pulse: 58 73 64 53  Temp: 97.6 F (36.4 C) 98 F (36.7 C) 98.5 F (36.9 C) 97.4 F (36.3 C)  TempSrc: Oral Oral Oral Oral  Resp: 24 20 18 20   Height:      Weight:   91.037 kg (200 lb 11.2 oz)   SpO2: 100% 100% 96% 97%    Intake/Output Summary (Last 24 hours) at 06/10/12 1846 Last data filed at 06/10/12 1836  Gross per 24 hour  Intake    723 ml  Output    950 ml  Net   -227 ml   Weight change:  Exam:   General:  Pt is alert, follows commands appropriately, not in acute distress  HEENT: No icterus,  Julian/AT  Cardiovascular: RRR,  no rubs,   Respiratory: Bibasilar crackles. No wheezes or rhonchi.  Abdomen: Soft/+BS, non tender, non distended, no guarding  Extremities: 3+ edema, No lymphangitis, No petechiae, No rashes, no synovitis  Data Reviewed: Basic Metabolic Panel:  Lab 06/10/12 5784 06/09/12 0425 06/08/12 0905 06/07/12 1555  NA 139 137 138 136  K 3.2* 3.9 4.9 5.3*  CL 101 99 97 96  CO2 22 21 21 21   GLUCOSE 101* 115* 100* 88  BUN 101* 102* 99* 89*  CREATININE 3.37* 3.90* 4.07* 3.86*  CALCIUM 8.2* 8.0* 8.5 8.8  MG -- -- -- --  PHOS -- 7.2* -- --   Liver Function Tests:  Lab 06/09/12 0910 06/09/12 0425 06/08/12 0905 06/07/12 1555  AST 1147* -- 1504* 1595*  ALT 1153* -- 1312* 1270*  ALKPHOS 128* -- 129* 125*  BILITOT 2.0* -- 3.1* 3.2*  PROT 6.3 -- 6.6 6.8  ALBUMIN 3.0* 2.9* 3.2* 3.3*   No results found for this basename: LIPASE:5,AMYLASE:5 in the last 168 hours  Lab 06/07/12 1555  AMMONIA 36   CBC:  Lab 06/09/12 0425 06/08/12 0905 06/07/12 1555  WBC 6.1 6.4 7.5  NEUTROABS -- -- --  HGB 10.4* 10.7* 10.9*  HCT 34.3* 36.0* 36.4*  MCV 84.1 84.3 84.5  PLT 41* 54* 68*   Cardiac Enzymes: No results found for this basename: CKTOTAL:5,CKMB:5,CKMBINDEX:5,TROPONINI:5 in the last 168 hours BNP: No components found with this basename: POCBNP:5 CBG:  Lab  06/07/12 1407  GLUCAP 87    Recent Results (from the past 240 hour(s))  MRSA PCR SCREENING     Status: Normal   Collection Time   06/07/12  2:42 PM      Component Value Range Status Comment   MRSA by PCR NEGATIVE  NEGATIVE Final   URINE CULTURE     Status: Normal   Collection Time   06/08/12 10:32 AM      Component Value Range Status Comment   Specimen Description URINE, CATHETERIZED   Final    Special Requests Normal   Final    Culture  Setup Time 06/08/2012 11:00   Final    Colony Count NO GROWTH   Final    Culture NO GROWTH   Final    Report Status 06/09/2012 FINAL   Final      Scheduled Meds:   .  docusate sodium  100 mg Oral BID  . feeding supplement  1 Container Oral TID BM  . furosemide  40 mg Intravenous BID  . gabapentin  200 mg Oral QHS  . sodium chloride  3 mL Intravenous Q12H   Continuous Infusions:   . sodium chloride 10 mL/hr at 06/09/12 0800     Shariece Viveiros, DO  [facility] Pager [phone]  If 7PM-7AM, please contact night-coverage [email or web address] Password [facility] 06/10/2012, 6:46 PM   LOS: 3 days

## 2012-06-10 NOTE — Progress Notes (Signed)
PT Cancellation Note  Patient Details Name: Juan Wolf MRN: [id number] DOB: 1922/06/15   Cancelled Treatment:    Reason Eval/Treat Not Completed:  (Noted Palliative meeting later today and ? starting Hospice.)  Will hold PT at this time.  Please advise after Palliative team meeting if pt wishing PT to be involved in care.     Sunny Schlein, [place] [phone] 06/10/2012, 1:36 PM

## 2012-06-10 NOTE — Consult Note (Signed)
Patient Juan Wolf      DOB: December 26, 1921      WUJ:[id number]     Consult Note from the Palliative Medicine Team at [facility]    Consult Requested by: Dr. Kirke Corin    PCP: Ignatius Specking., MD Reason for Consultation: GOC    Phone Number:[phone] Related symptom recommendations Assessment of patients Current state: 78 year old white male with known history of Aortic stenosis, atrial fibrillation, CAD s/p CABG.  Patient was recently seen by cardiology where adjustments to his medications were made post hospital stay- HCTZ was added to already present lasix, and a beta blocker was resumed.  He Subsequently started to feel badly with symptoms of nausea, vomiting, hypotension and subsequent syncope. He had been having worsening symptoms of dyspnea with ADL.  He was transferred from [facility] with shock liver, and exacerbation of CHF, and worsening renal dysfunction.  He has continued to do poorly with worsening uremia, confusion, generalized weakness, and dyspnea.  The patient states that he feels as if he is dying.  He understands that his heart, kidneys and liver have suffered setbacks.  He feels this was a rapid change for him as up until last week he lived on his own and took care of himself.  He lost his spouse in the last year and his son's have taken excellent care of him.  We started to help him understand how ill he is.  I spoke with his son's separately initially and they feel that if his condition is going to continue to worsen that they would be interested in considering residential hospice placement.  At this time, they would like to see how he does with the adjustments in his medications. They would like to focus on comfort but continue current treatments at this time. We will be talking further with him about hospice care.  He was too tired to move forward and was falling in and out of sleep.      Goals of Care: 1.  Code Status: DNR/DNI   2. Scope of Treatment: 1. Vital Signs: No  change continue to monitor. 2. Respiratory/Support/Tube Feeds:  As needed oxygen 3. Family open to residential hospice if symptoms and organ failure worsens 4. No transfusions   4. Disposition: To be determined likely to Residential Hospice near [place].Will be discussing with patient in next 24-48 hours   3. Symptom Management:   1. Anxiety/Agitation: None at this time,  Can add ativan prn if needed. 2. Pain: Agree with oxycodone, if unable to swallow can use concentrate sublingual, or IV morphine can be used.  His son reports good effects in the ER with Morphine on his dyspnea 3. Bowel Regimen: on colace 4. Delirium: high risk for symptoms. Monitor and treat prn 5. Fever: prn tylenol 6. Nausea/Vomiting: currently denies.  Use prn meds 7. Terminal Secretions: Not present can add atropine if needed  4. Psychosocial:The patient owned a filling station and garage, he has three very loving sons: Vivi Martens, and Rosanne Ashing.  He lost his wife in the last 1 year and was living alone up until his hospitalization  5. Spiritual: He has had visiting from the families pastor from the [employer]    Patient Documents Completed or Given: Document Given Completed  Advanced Directives Pkt    MOST    DNR    Gone from My Sight    Hard Choices      Brief HPI: 78 yr old transferred from [facility] where he  was being evaluated for N,V, dizziness, and subsequent syncope. He present with hypotension, acute on chronic renal disease, shock liver, and decompensated heart failure.  His condition was not improving with decompensation in his renal function and ineffective diuresis.   ROS: Currently denies any pain, although his son's tell me he has pain in his legs and feet, he has had a poor appetite for some time.    PMH:  Past Medical History  Diagnosis Date  . Umbilical hernia without mention of obstruction or gangrene   . Other and unspecified hyperlipidemia   . COPD (chronic obstructive  pulmonary disease)   . Hypertension   . Cramp of limb   . Atrial flutter     s/p RFCA   . Atrial fibrillation     tikosyn rx d/c in setting of VT (possibly ischemia mediated though)  . CAD (coronary artery disease)     a) Status post balloon angioplasty, 1987, b) 4-vessel CABG, 1997, c) Bare metal stenting, SVG-RCA and SVG-LAD/diagonal graft, 06/2011, d) BMS stent to mid body of SVG to OM2 12/18/2011   . Aortic stenosis 06/05/2011  . Chronic kidney disease     CKD  . Complication of anesthesia     " The doctor told me not to let anyone put me to sleep"  . Myocardial infarction   . GERD (gastroesophageal reflux disease)   . Arthritis   . Chronic diastolic heart failure   . Ventricular tachycardia     12/2011  . Pulmonary HTN      PSH: Past Surgical History  Procedure Date  . Appendectomy   . Back surgery   . Cholecystectomy   . Hernia repair   . Cardiac catheterization 06/19/2011    Moderate pulmonary hypertension, moderate AS (valve area: 1.05), severe 3 vessel CAD. 90% stensis in both SVG to LAD/diagonal and SVG to RCA. Patent SVG to OM.   Marland Kitchen Coronary angioplasty 06/19/2011    BMS to both SVG to RCA and SVG to LAD/diagonal  . Coronary artery bypass graft    I have reviewed the FH and SH and  If appropriate update it with new information. Allergies  Allergen Reactions  . Contrast Media (Iodinated Diagnostic Agents)     Using during MRI   Scheduled Meds:   . docusate sodium  100 mg Oral BID  . feeding supplement  1 Container Oral TID BM  . furosemide  40 mg Intravenous BID  . gabapentin  200 mg Oral QHS  . sodium chloride  3 mL Intravenous Q12H   Continuous Infusions:   . sodium chloride 10 mL/hr at 06/09/12 0800   PRN Meds:.acetaminophen, acetaminophen, albuterol, fluticasone, nitroGLYCERIN, ondansetron (ZOFRAN) IV, ondansetron, oxyCODONE, zolpidem    BP 114/50  Pulse 53  Temp 97.4 F (36.3 C) (Oral)  Resp 20  Ht 5\' 9"  (1.753 m)  Wt 91.037 kg (200 lb 11.2  oz)  BMI 29.64 kg/m2  SpO2 97%   PPS:20-30 %  Intake/Output Summary (Last 24 hours) at 06/10/12 2049 Last data filed at 06/10/12 1836  Gross per 24 hour  Intake    723 ml  Output   1550 ml  Net   -827 ml   LBM11/25                       Stool Softner: as needed  Physical Exam:  General: very weak,  Unable to keep eyes open at times, voice very low whisper at times HEENT:  PERRL,  EOMI, anicteric, MM dry Chest: decreased air entry on the left, right has course rhonchi CVS: irregular, S1, S2  II/VI Murmur Ext: trace  Edema Neuro: awake , alert oriented to person, general place, and family Abdomen:soft, NT, ND  Labs: CBC    Component Value Date/Time   WBC 6.1 06/09/2012 0425   RBC 4.08* 06/09/2012 0425   HGB 10.4* 06/09/2012 0425   HCT 34.3* 06/09/2012 0425   PLT 41* 06/09/2012 0425   MCV 84.1 06/09/2012 0425   MCH 25.5* 06/09/2012 0425   MCHC 30.3 06/09/2012 0425   RDW 18.0* 06/09/2012 0425   LYMPHSABS 2.0 09/09/2011 2052   MONOABS 0.7 09/09/2011 2052   EOSABS 0.1 09/09/2011 2052   BASOSABS 0.0 09/09/2011 2052     CMP     Component Value Date/Time   NA 139 06/10/2012 0500   K 3.2* 06/10/2012 0500   CL 101 06/10/2012 0500   CO2 22 06/10/2012 0500   GLUCOSE 101* 06/10/2012 0500   BUN 101* 06/10/2012 0500   CREATININE 3.37* 06/10/2012 0500   CALCIUM 8.2* 06/10/2012 0500   PROT 6.3 06/09/2012 0910   ALBUMIN 3.0* 06/09/2012 0910   AST 1147* 06/09/2012 0910   ALT 1153* 06/09/2012 0910   ALKPHOS 128* 06/09/2012 0910   BILITOT 2.0* 06/09/2012 0910   GFRNONAA 15* 06/10/2012 0500   GFRAA 17* 06/10/2012 0500    Chest Xray Reviewed/Impressions: improving CHF      Time In Time Out Total Time Spent with Patient Total Overall Time  500 pm 6 pm 30 min 60 min    Greater than 50%  of this time was spent counseling and coordinating care related to the above assessment and plan.  Discussed with Dr. Bronwen Betters L. Ladona Ridgel, MD MBA The Palliative Medicine Team at  [facility] Phone: [phone] Pager: [phone]

## 2012-06-11 DIAGNOSIS — M549 Dorsalgia, unspecified: Secondary | ICD-10-CM

## 2012-06-11 DIAGNOSIS — R5383 Other fatigue: Secondary | ICD-10-CM

## 2012-06-11 DIAGNOSIS — R5381 Other malaise: Secondary | ICD-10-CM

## 2012-06-11 LAB — BASIC METABOLIC PANEL
BUN: 89 mg/dL — ABNORMAL HIGH (ref 6–23)
CO2: 23 mEq/L (ref 19–32)
Chloride: 100 mEq/L (ref 96–112)
Creatinine, Ser: 2.96 mg/dL — ABNORMAL HIGH (ref 0.50–1.35)
GFR calc Af Amer: 20 mL/min — ABNORMAL LOW (ref >90)

## 2012-06-11 MED ORDER — POTASSIUM CHLORIDE 20 MEQ/15ML (10%) PO LIQD
40.0000 meq | Freq: Once | ORAL | Status: AC
  Administered 2012-06-11: 40 meq via ORAL
  Filled 2012-06-11: qty 30

## 2012-06-11 MED ORDER — POTASSIUM CHLORIDE CRYS ER 20 MEQ PO TBCR: 40.0000 meq | EXTENDED_RELEASE_TABLET | Freq: Once | ORAL | Status: DC

## 2012-06-11 NOTE — Progress Notes (Signed)
[facility] PROGRESS NOTE  OBE AHLERS JXB:[id number] DOB: [birth year] DOA: 06/07/2012 PCP: Ignatius Specking., MD  Assessment/Plan: Acute on Chronic CHF -neg 1312cc past 24 hrs -renal function improving -continue furosemide IV -EF 50% Acute on chronic renal failure  Baseline crt ~1.2-1.3 - likely due to hypoperfusion due to factors detailed below - -at this time there is no acute indication for HD - normal renal US  Acute hepatitis / transaminitis / coagulopathy  - passive hepatic congestion due to severe R heart failure w/ probable acute exacerbation related to hypoperfusion due to volume depletion  Chronic diastolic CHF  11/22 TTE >> mod LVH, LVEF 50%, RV dilation, RA E, LAE, mod-severe AS, mild AI, IVC dilation, mod to severe RV dysfunction  -Continue furosemide 40 mg IV twice a day Suprapubic discomfort -Check UA and urine culture  Mod-severe Aortic stenosis  valve area 1.05 via cath Dec 2012 - clearly not a candidate for tx/intervention  A fib/flutter  S/p RFCA - no anticoag due to hx of GIB - rate controlled  Lethargy/altered mental status  Less confused today - likely due to uremia - cont to hydrate per cards- normal B12 -normal folate level  -Urinalysis does not suggest UTI  COPD  Well compensated at present  Pulm HTN / R Heart failure/ Cor Pulmonale  RV severely dilated and hypokinetic via echo 11/22 - pt is volume/preload dependent - hydrating - BP improved  CAD  Status post balloon angioplasty, 1987, 4-vessel CABG, 1997, Bare metal stenting, SVG-RCA and SVG-LAD/diagonal graft, 06/2011, BMS stent to mid body of SVG to OM2 12/18/2011 - Cardiology is following  syncope  Due to volume depeletion in setting of hypotension + RHF + AoS  HTN  Not an active issue at this time  Hypokalemia -replete -check mag thrombyocytopenia  -likely due to chronic hepatic congestion from cor pulmonale No spontaneous bleeding noted - follow trend  Code Status: DNR   Consultants:    PCCM  Cards - [place]      Disposition Plan:   Residential hospice if no improvement     Procedures/Studies: US Renal  06/08/2012  *RADIOLOGY REPORT*  Clinical Data: Acute renal failure.  RENAL/URINARY TRACT ULTRASOUND COMPLETE  Comparison:  Previous abdomen ultrasound.  Findings:  Right Kidney:  Normal, measuring 11.2 cm in length.  No hydronephrosis.  Left Kidney:  Normal, measuring 10.1 cm in length.  No hydronephrosis.  Bladder:  Empty, with a Foley catheter in place.  IMPRESSION: Normal examination.   Original Report Authenticated By: Beckie Salts, M.D.    Dg Chest Port 1 View  06/09/2012  *RADIOLOGY REPORT*  Clinical Data: Cough and congestion.  PORTABLE CHEST - 1 VIEW  Comparison: 06/07/2012.  Findings: The heart remains enlarged.  There is persistent central vascular congestion, mild interstitial pulmonary edema, bilateral pleural effusions and bibasilar atelectasis.  Overall slight improved aeration may suggest resolving edema.  IMPRESSION: Persistent but slightly improved CHF.   Original Report Authenticated By: Rudie Meyer, M.D.    Dg Chest Port 1 View  06/07/2012  *RADIOLOGY REPORT*  Clinical Data: Congestive heart failure.  PORTABLE CHEST - 1 VIEW  Comparison: Chest 06/04/2012.  Findings: Bilateral pleural effusions and basilar airspace disease persist.  The patient's right effusion has worsened.  There is cardiomegaly and interstitial edema.  IMPRESSION: Increased interstitial edema.  Bilateral pleural effusions persist and the right effusion is larger than on the prior study.   Original Report Authenticated By: Holley Dexter, M.D.  Subjective: Patient complains of some shortness of breath, not any worse than usual. Denies any headache, chest pain, abdominal pain, vomiting, diarrhea. Complains of some suprapubic discomfort. Denies fevers or chills  Objective: Filed Vitals:   06/10/12 1436 06/10/12 2145 06/11/12 0653 06/11/12 1020  BP: 114/50 120/48  122/53 118/67  Pulse: 53 77 70 97  Temp: 97.4 F (36.3 C) 97.4 F (36.3 C) 97.3 F (36.3 C)   TempSrc: Oral Oral Oral   Resp: 20 20 20    Height:      Weight:   90.8 kg (200 lb 2.8 oz)   SpO2: 97% 98% 99%     Intake/Output Summary (Last 24 hours) at 06/11/12 1333 Last data filed at 06/11/12 1300  Gross per 24 hour  Intake   1083 ml  Output   2525 ml  Net  -1442 ml   Weight change: -0.6 kg (-1 lb 5.2 oz) Exam:   General:  Pt is alert, follows commands appropriately, not in acute distress  HEENT: No icterus, No thrush, No neck mass, [place]/AT  Cardiovascular: IRRR, no rubs, no gallops  Respiratory: Bilateral crackles. No wheezes or rhonchi. Good air movement.  Abdomen: Soft/+BS, non tender, non distended, no guarding  Extremities: 2+ edema, No lymphangitis, No petechiae, No rashes, no synovitis  Data Reviewed: Basic Metabolic Panel:  Lab 06/11/12 1610 06/10/12 0500 06/09/12 0425 06/08/12 0905 06/07/12 1555  NA 139 139 137 138 136  K 3.3* 3.2* 3.9 4.9 5.3*  CL 100 101 99 97 96  CO2 23 22 21 21 21   GLUCOSE 162* 101* 115* 100* 88  BUN 89* 101* 102* 99* 89*  CREATININE 2.96* 3.37* 3.90* 4.07* 3.86*  CALCIUM 8.3* 8.2* 8.0* 8.5 8.8  MG -- -- -- -- --  PHOS -- -- 7.2* -- --   Liver Function Tests:  Lab 06/09/12 0910 06/09/12 0425 06/08/12 0905 06/07/12 1555  AST 1147* -- 1504* 1595*  ALT 1153* -- 1312* 1270*  ALKPHOS 128* -- 129* 125*  BILITOT 2.0* -- 3.1* 3.2*  PROT 6.3 -- 6.6 6.8  ALBUMIN 3.0* 2.9* 3.2* 3.3*   No results found for this basename: LIPASE:5,AMYLASE:5 in the last 168 hours  Lab 06/07/12 1555  AMMONIA 36   CBC:  Lab 06/09/12 0425 06/08/12 0905 06/07/12 1555  WBC 6.1 6.4 7.5  NEUTROABS -- -- --  HGB 10.4* 10.7* 10.9*  HCT 34.3* 36.0* 36.4*  MCV 84.1 84.3 84.5  PLT 41* 54* 68*   Cardiac Enzymes: No results found for this basename: CKTOTAL:5,CKMB:5,CKMBINDEX:5,TROPONINI:5 in the last 168 hours BNP: No components found with this basename:  POCBNP:5 CBG:  Lab 06/07/12 1407  GLUCAP 87    Recent Results (from the past 240 hour(s))  MRSA PCR SCREENING     Status: Normal   Collection Time   06/07/12  2:42 PM      Component Value Range Status Comment   MRSA by PCR NEGATIVE  NEGATIVE Final   URINE CULTURE     Status: Normal   Collection Time   06/08/12 10:32 AM      Component Value Range Status Comment   Specimen Description URINE, CATHETERIZED   Final    Special Requests Normal   Final    Culture  Setup Time 06/08/2012 11:00   Final    Colony Count NO GROWTH   Final    Culture NO GROWTH   Final    Report Status 06/09/2012 FINAL   Final      Scheduled Meds:   .  docusate sodium  100 mg Oral BID  . feeding supplement  1 Container Oral TID BM  . furosemide  40 mg Intravenous BID  . gabapentin  200 mg Oral QHS  . sodium chloride  3 mL Intravenous Q12H   Continuous Infusions:   . sodium chloride 10 mL/hr at 06/09/12 0800     Harlem Bula, DO  [facility] Pager [phone]  If 7PM-7AM, please contact night-coverage [email or web address] Password [email or web address] 06/11/2012, 1:33 PM   LOS: 4 days

## 2012-06-11 NOTE — Progress Notes (Signed)
Patient had a 27 beat run of V-tach. Patient was asymptomatic at the time. No complaints and no signs or symptoms of distress. BP was 127/49 and HR 103. MD was made aware. No new orders given. Will continue to monitor patient for further changes in condition.

## 2012-06-11 NOTE — Progress Notes (Signed)
Patient WU:[id number] YOUSAF SAINATO      DOB: 1922-02-14      NWG:[id number]   Palliative Medicine Team at [facility] Progress Note    Subjective:  Met with patient earlier this afternoon.  He seemed more spontaneous and was a little stronger.  His only complaint was his back which was being addressed with pain meds and an air mattress overylay.  Had been awaiting a call from his sons regarding when they would be coming to see him but did not get a call.  Will attempt to communicate with them again in am.  Patient also complained about his kidneys hurting which he meant his penis.  UA no growth from 11/25.  Urine smells strong.      Filed Vitals:   06/11/12 2026  BP: 131/48  Pulse: 82  Temp: 97.1 F (36.2 C)  Resp: 18   Physical exam: General: less work of breathing, no acute distress PERRL, EOMi, anicteric Chest: basilar crackles bilaterally CVS: irregular, S1, S2 Abdomen: obese soft, tender over lower pelvis, no guarding ar rebound Ext: trace edema Neuro: awake, more active, orient to time and place  Lab Results  Component Value Date   WBC 6.1 06/09/2012   HGB 10.4* 06/09/2012   HCT 34.3* 06/09/2012   MCV 84.1 06/09/2012   PLT 41* 06/09/2012      Lab Results  Component Value Date   CREATININE 2.96* 06/11/2012   BUN 89* 06/11/2012   NA 139 06/11/2012   K 3.3* 06/11/2012   CL 100 06/11/2012   CO2 23 06/11/2012     Assessment and plan: 78 year old white male with acute on chronic renal failure,  Severe AS, shock liver, exacerbation of CHF.  1. DNR/ DNI family leaning toward comfort care and may elect residential hospice if he continues to show no improvement  2.  Dyspnea secondary to CHF: agree with diuresis as be can be accomplished.  Crt better today.  3.  Low back pain. Agree with air overlay and prn medications for pain  4.  Genital pain; likely secondary to foley.   If persists could consider repeat UA with culture.   Family was going to call me when they were  coming down today.  They did not call , I will followup on Friday.   Total time: 15 min  Ulices Maack L. Ladona Ridgel, MD MBA The Palliative Medicine Team at [facility] Phone: [phone] Pager: [phone]

## 2012-06-11 NOTE — Progress Notes (Signed)
Patient ID: Juan Wolf, male   DOB: [birth year], 78 y.o.   MRN: [id number]   SUBJECTIVE: Patient is weak today. He is responsive. He's not having any chest pain. I'm glad that Dr. Kirke Corin from our team could see him yesterday. He actually knows the patient best. And he recommended palliative care. The patient has had some diuresis.   Filed Vitals:   06/10/12 0505 06/10/12 1436 06/10/12 2145 06/11/12 0653  BP: 114/43 114/50 120/48 122/53  Pulse: 64 53 77 70  Temp: 98.5 F (36.9 C) 97.4 F (36.3 C) 97.4 F (36.3 C) 97.3 F (36.3 C)  TempSrc: Oral Oral Oral Oral  Resp: 18 20 20 20   Height:      Weight: 200 lb 11.2 oz (91.037 kg)   200 lb 2.8 oz (90.8 kg)  SpO2: 96% 97% 98% 99%    Intake/Output Summary (Last 24 hours) at 06/11/12 0718 Last data filed at 06/11/12 4098  Gross per 24 hour  Intake    963 ml  Output   2275 ml  Net  -1312 ml    LABS: Basic Metabolic Panel:  Basename 06/10/12 0500 06/09/12 0425  NA 139 137  K 3.2* 3.9  CL 101 99  CO2 22 21  GLUCOSE 101* 115*  BUN 101* 102*  CREATININE 3.37* 3.90*  CALCIUM 8.2* 8.0*  MG -- --  PHOS -- 7.2*   Liver Function Tests:  Basename 06/09/12 0910 06/09/12 0425 06/08/12 0905  AST 1147* -- 1504*  ALT 1153* -- 1312*  ALKPHOS 128* -- 129*  BILITOT 2.0* -- 3.1*  PROT 6.3 -- 6.6  ALBUMIN 3.0* 2.9* --   No results found for this basename: LIPASE:2,AMYLASE:2 in the last 72 hours CBC:  Basename 06/09/12 0425 06/08/12 0905  WBC 6.1 6.4  NEUTROABS -- --  HGB 10.4* 10.7*  HCT 34.3* 36.0*  MCV 84.1 84.3  PLT 41* 54*   Cardiac Enzymes: No results found for this basename: CKTOTAL:3,CKMB:3,CKMBINDEX:3,TROPONINI:3 in the last 72 hours BNP: No components found with this basename: POCBNP:3 D-Dimer: No results found for this basename: DDIMER:2 in the last 72 hours Hemoglobin A1C: No results found for this basename: HGBA1C in the last 72 hours Fasting Lipid Panel: No results found for this basename:  CHOL,HDL,LDLCALC,TRIG,CHOLHDL,LDLDIRECT in the last 72 hours Thyroid Function Tests: No results found for this basename: TSH,T4TOTAL,FREET3,T3FREE,THYROIDAB in the last 72 hours  RADIOLOGY: US Renal  06/08/2012  *RADIOLOGY REPORT*  Clinical Data: Acute renal failure.  RENAL/URINARY TRACT ULTRASOUND COMPLETE  Comparison:  Previous abdomen ultrasound.  Findings:  Right Kidney:  Normal, measuring 11.2 cm in length.  No hydronephrosis.  Left Kidney:  Normal, measuring 10.1 cm in length.  No hydronephrosis.  Bladder:  Empty, with a Foley catheter in place.  IMPRESSION: Normal examination.   Original Report Authenticated By: Beckie Salts, M.D.    Dg Chest Port 1 View  06/09/2012  *RADIOLOGY REPORT*  Clinical Data: Cough and congestion.  PORTABLE CHEST - 1 VIEW  Comparison: 06/07/2012.  Findings: The heart remains enlarged.  There is persistent central vascular congestion, mild interstitial pulmonary edema, bilateral pleural effusions and bibasilar atelectasis.  Overall slight improved aeration may suggest resolving edema.  IMPRESSION: Persistent but slightly improved CHF.   Original Report Authenticated By: Rudie Meyer, M.D.    Dg Chest Port 1 View  06/07/2012  *RADIOLOGY REPORT*  Clinical Data: Congestive heart failure.  PORTABLE CHEST - 1 VIEW  Comparison: Chest 06/04/2012.  Findings: Bilateral pleural effusions and basilar airspace disease persist.  The patient's right effusion has worsened.  There is cardiomegaly and interstitial edema.  IMPRESSION: Increased interstitial edema.  Bilateral pleural effusions persist and the right effusion is larger than on the prior study.   Original Report Authenticated By: Holley Dexter, M.D.     PHYSICAL EXAM   The patient is weak but responsive. There is no jugulovenous distention. Lung exam reveals diffuse rales. Cardiac exam rales S1 and S2. There is a crescendo decrescendo systolic murmur of aortic stenosis. The abdomen is soft there is only trace peripheral  edema.   TELEMETRY:  I have personally reviewed telemetry today June 11, 2012. There is atrial fib with a controlled rate.   ASSESSMENT AND PLAN:   CAD (coronary artery disease)     Stable at this time.   Aortic stenosis     No plan for intervention at this time.  Acute on chronic diastolic CHF (congestive heart failure),      Treatment of this is quite difficult. There have been multiple discussions in the record concerning his tenuous volume status. I do feel that we should continue to diuresis. He has marked rales. However, he will have to be left on the wet side in general or else he will have further recurring episodes such as the one that caused syncope and presentation to the hospital.   Renal insufficiency     I've ordered chemistry for today and tomorrow    Atrial fibrillation    Rate is controlled. No anticoagulation due to prior GI bleeding    Shock liver    Improving with stabilization  Palliative care   Consultation placed   [name] 06/11/2012 7:18 AM

## 2012-06-12 DIAGNOSIS — M549 Dorsalgia, unspecified: Secondary | ICD-10-CM

## 2012-06-12 LAB — HEPATIC FUNCTION PANEL
ALT: 452 U/L — ABNORMAL HIGH (ref 0–53)
Alkaline Phosphatase: 128 U/L — ABNORMAL HIGH (ref 39–117)
Bilirubin, Direct: 0.7 mg/dL — ABNORMAL HIGH (ref 0.0–0.3)
Indirect Bilirubin: 0.6 mg/dL (ref 0.3–0.9)

## 2012-06-12 LAB — BASIC METABOLIC PANEL
BUN: 83 mg/dL — ABNORMAL HIGH (ref 6–23)
CO2: 25 mEq/L (ref 19–32)
Chloride: 101 mEq/L (ref 96–112)
Creatinine, Ser: 2.77 mg/dL — ABNORMAL HIGH (ref 0.50–1.35)

## 2012-06-12 MED ORDER — POTASSIUM CHLORIDE 20 MEQ/15ML (10%) PO LIQD
40.0000 meq | Freq: Once | ORAL | Status: AC
  Administered 2012-06-12: 40 meq via ORAL
  Filled 2012-06-12: qty 30

## 2012-06-12 MED ORDER — POTASSIUM CHLORIDE 10 MEQ/100ML IV SOLN
10.0000 meq | INTRAVENOUS | Status: AC
  Administered 2012-06-12 (×2): 10 meq via INTRAVENOUS
  Filled 2012-06-12 (×2): qty 100

## 2012-06-12 MED ORDER — MAGIC MOUTHWASH
10.0000 mL | Freq: Three times a day (TID) | ORAL | Status: DC
  Administered 2012-06-12 – 2012-06-19 (×20): 10 mL via ORAL
  Filled 2012-06-12 (×23): qty 10

## 2012-06-12 NOTE — Progress Notes (Signed)
Patient ZO:XWRUEAV THEUS ESPIN      DOB: 10/25/1921      WUJ:[id number]   Palliative Medicine Team at [facility] Progress Note    Subjective:  Patient remains very weak,  Throat is raw today, and he remains coughing thick mucous up.  He reports no further kidney pain, but his back is still bothering him.  I spoke with his daughter in law who will get her son to call me.  It appears that they are likely still leaning toward possible comfort care with residential hospice as an option.     Filed Vitals:   06/12/12 0510  BP: 119/60  Pulse: 61  Temp: 97 F (36.1 C)  Resp: 18   Physical exam: General:  Very weak,  Hoarse,  No oral thrush PERRL, EOMI, anciteric, MM dry Chest : decreased crackles at the bases CVS: irregular, S1, S2 Ext :  No edema Neuro: weak, trying to orient himself- he asked me was it day or night, what was the day and date.   Lab Results  Component Value Date   WBC 6.1 06/09/2012   HGB 10.4* 06/09/2012   HCT 34.3* 06/09/2012   MCV 84.1 06/09/2012   PLT 41* 06/09/2012   Lab Results  Component Value Date   CREATININE 2.77* 06/12/2012   BUN 83* 06/12/2012   NA 140 06/12/2012   K 3.0* 06/12/2012   CL 101 06/12/2012   CO2 25 06/12/2012   Crt improving very slightly,  Potassium low  Assessment and plan: 78 yr old with severe AS, acute on chronic renal failure, shock liver which is improved.   1.  DNR/DNI  2.  Throat discomfort: no evidence for thrush agree with magic mouthwash.  3. Back pain; agree with oxycodone prn, air mattress overlay  4.  Disposition:  To be determined.  I asked his DIL to have her spouse call me with a time to communicate about next steps. Total time:  2o min  Regene Mccarthy L. Ladona Ridgel, MD MBA The Palliative Medicine Team at [facility] Phone: [phone] Pager: [phone]   Addendum: son called me back while I was working on this note.  Since his renal function is improved they would like to see how he does this weekend.  They agree  to make an appointment with me to talk to the patient about hospice care, which for the patient has meant in the past a negative thing.  He always told his sons if someone goes to hospice they are going to die soon."   Kayron Kalmar L. Ladona Ridgel, MD MBA The Palliative Medicine Team at [facility] Phone: [phone] Pager: [phone]

## 2012-06-12 NOTE — Progress Notes (Signed)
PT Cancellation Note  Patient Details Name: Juan Wolf MRN: [id number] DOB: [birth year]   Cancelled Treatment:    Reason Eval/Treat Not Completed: Fatigue/lethargy limiting ability to participate;Other (comment) (family considering comfort care).  Pt. Reporting he is too fatigued to even roll in bed.  Unable to evaluate today, and note that family is considering comfort care .  Will f/u Monday to proceed with eval or sign off depending on direction of care.   Ferman Hamming 06/12/2012, 9:51 AM Weldon Picking PT Acute Rehab Services [phone] Beeper [phone]

## 2012-06-12 NOTE — Progress Notes (Signed)
Pt unable to tolerate iv KCL , c/o of pain to site with palpation.  No swelling or redness noted.  IV team paged for restart.  Amanda Pea, Charity fundraiser.

## 2012-06-12 NOTE — Progress Notes (Signed)
[facility] PROGRESS NOTE  Juan Wolf EAV:[id number] DOB: January 30, 1922 DOA: 06/07/2012 PCP: Ignatius Specking., MD  Assessment/Plan: Acute on Chronic diastolic CHF  -neg 1337cc past 24 hrs; neg 954cc for the admission -renal function improving  -continue furosemide IV  -EF 50%  -DNR--possible residential hospice Acute on chronic renal failure  -some improvement with diuresis -f/u am BMP Baseline crt ~1.2-1.3 - likely due to hypoperfusion   -no acute indication for HD - normal renal US  Acute hepatitis / transaminitis / coagulopathy  - passive hepatic congestion due to severe R heart failure   -exacerbation related to hypoperfusion due to volume depletion  -improving Suprapubic discomfort  -check ua and urine culture if persists Lethargy/altered mental status  More lucid - likely due to uremia - cont to hydrate per cards- normal B12 -normal folate level  -Urinalysis does not suggest UTI (11/25) Mod-severe Aortic stenosis  valve area 1.05 via cath Dec 2012 - clearly not a candidate for tx/intervention  A fib/flutter  S/p RFCA - no anticoag due to hx of GIB - rate controlled  COPD  Well compensated at present  Pulm HTN / R Heart failure/ Cor Pulmonale  RV severely dilated and hypokinetic via echo 11/22 - pt is volume/preload dependent - hydrating - BP improved  11/22 TTE >> mod LVH, LVEF 50%, RV dilation, RA E, LAE, mod-severe AS, mild AI, IVC dilation, mod to severe RV dysfunction  CAD  Status post balloon angioplasty, 1987, 4-vessel CABG, 1997, Bare metal stenting, SVG-RCA and SVG-LAD/diagonal graft, 06/2011, BMS stent to mid body of SVG to OM2 12/18/2011 - Cardiology is following  syncope  Due to volume depeletion in setting of hypotension + RHF + AoS  HTN  Not an active issue at this time  Hypokalemia  -replete  -mag--2.4 thrombyocytopenia  -likely due to chronic hepatic congestion from cor pulmonale  No spontaneous bleeding noted - follow trend  Code Status: DNR    Consultants:  PCCM  Cards - [place]     Disposition Plan:   Possible residential hospice--discussed with Dr. Ladona Ridgel      Procedures/Studies: US Renal  06/08/2012  *RADIOLOGY REPORT*  Clinical Data: Acute renal failure.  RENAL/URINARY TRACT ULTRASOUND COMPLETE  Comparison:  Previous abdomen ultrasound.  Findings:  Right Kidney:  Normal, measuring 11.2 cm in length.  No hydronephrosis.  Left Kidney:  Normal, measuring 10.1 cm in length.  No hydronephrosis.  Bladder:  Empty, with a Foley catheter in place.  IMPRESSION: Normal examination.   Original Report Authenticated By: Beckie Salts, M.D.    Dg Chest Port 1 View  06/09/2012  *RADIOLOGY REPORT*  Clinical Data: Cough and congestion.  PORTABLE CHEST - 1 VIEW  Comparison: 06/07/2012.  Findings: The heart remains enlarged.  There is persistent central vascular congestion, mild interstitial pulmonary edema, bilateral pleural effusions and bibasilar atelectasis.  Overall slight improved aeration may suggest resolving edema.  IMPRESSION: Persistent but slightly improved CHF.   Original Report Authenticated By: Rudie Meyer, M.D.    Dg Chest Port 1 View  06/07/2012  *RADIOLOGY REPORT*  Clinical Data: Congestive heart failure.  PORTABLE CHEST - 1 VIEW  Comparison: Chest 06/04/2012.  Findings: Bilateral pleural effusions and basilar airspace disease persist.  The patient's right effusion has worsened.  There is cardiomegaly and interstitial edema.  IMPRESSION: Increased interstitial edema.  Bilateral pleural effusions persist and the right effusion is larger than on the prior study.   Original Report Authenticated By: Holley Dexter, M.D.  Subjective: Patient complaint of "tailbone pain". He states that he is breathing better. Denies any chest pain, headache, fevers, chills, abdominal pain, vomiting, diarrhea.  Objective: Filed Vitals:   06/11/12 1020 06/11/12 1453 06/11/12 2026 06/12/12 0510  BP: 118/67 127/49 131/48 119/60   Pulse: 97 103 82 61  Temp:  98 F (36.7 C) 97.1 F (36.2 C) 97 F (36.1 C)  TempSrc:  Oral Oral Axillary  Resp:  20 18 18   Height:      Weight:    88.9 kg (195 lb 15.8 oz)  SpO2:  96% 98% 98%    Intake/Output Summary (Last 24 hours) at 06/12/12 0924 Last data filed at 06/12/12 0830  Gross per 24 hour  Intake    603 ml  Output   2300 ml  Net  -1697 ml   Weight change: -1.9 kg (-4 lb 3 oz) Exam:   General:  Pt is alert, follows commands appropriately, not in acute distress  HEENT: No icterus, No thrush, No neck mass, [place]/AT  Cardiovascular: IRRR, no rubs, no gallops  Respiratory: Bibasilar crackles. No wheezes or rhonchi.  Abdomen: Soft/ non tender, non distended, no guarding; mild suprapubic tenderness without any guarding or rebound.  Extremities: 2+ edema, No lymphangitis, No petechiae, No rashes, no synovitis  Data Reviewed: Basic Metabolic Panel:  Lab 06/12/12 1610 06/11/12 0936 06/10/12 0500 06/09/12 0425 06/08/12 0905 06/07/12 1555  NA -- 139 139 137 138 136  K -- 3.3* 3.2* 3.9 4.9 5.3*  CL -- 100 101 99 97 96  CO2 -- 23 22 21 21 21   GLUCOSE -- 162* 101* 115* 100* 88  BUN -- 89* 101* 102* 99* 89*  CREATININE -- 2.96* 3.37* 3.90* 4.07* 3.86*  CALCIUM -- 8.3* 8.2* 8.0* 8.5 8.8  MG 2.4 -- -- -- -- --  PHOS -- -- -- 7.2* -- --   Liver Function Tests:  Lab 06/09/12 0910 06/09/12 0425 06/08/12 0905 06/07/12 1555  AST 1147* -- 1504* 1595*  ALT 1153* -- 1312* 1270*  ALKPHOS 128* -- 129* 125*  BILITOT 2.0* -- 3.1* 3.2*  PROT 6.3 -- 6.6 6.8  ALBUMIN 3.0* 2.9* 3.2* 3.3*   No results found for this basename: LIPASE:5,AMYLASE:5 in the last 168 hours  Lab 06/07/12 1555  AMMONIA 36   CBC:  Lab 06/09/12 0425 06/08/12 0905 06/07/12 1555  WBC 6.1 6.4 7.5  NEUTROABS -- -- --  HGB 10.4* 10.7* 10.9*  HCT 34.3* 36.0* 36.4*  MCV 84.1 84.3 84.5  PLT 41* 54* 68*   Cardiac Enzymes: No results found for this basename: CKTOTAL:5,CKMB:5,CKMBINDEX:5,TROPONINI:5 in  the last 168 hours BNP: No components found with this basename: POCBNP:5 CBG:  Lab 06/07/12 1407  GLUCAP 87    Recent Results (from the past 240 hour(s))  MRSA PCR SCREENING     Status: Normal   Collection Time   06/07/12  2:42 PM      Component Value Range Status Comment   MRSA by PCR NEGATIVE  NEGATIVE Final   URINE CULTURE     Status: Normal   Collection Time   06/08/12 10:32 AM      Component Value Range Status Comment   Specimen Description URINE, CATHETERIZED   Final    Special Requests Normal   Final    Culture  Setup Time 06/08/2012 11:00   Final    Colony Count NO GROWTH   Final    Culture NO GROWTH   Final    Report Status 06/09/2012 FINAL  Final      Scheduled Meds:   . docusate sodium  100 mg Oral BID  . feeding supplement  1 Container Oral TID BM  . furosemide  40 mg Intravenous BID  . gabapentin  200 mg Oral QHS  . [COMPLETED] potassium chloride  40 mEq Oral Once  . sodium chloride  3 mL Intravenous Q12H  . [DISCONTINUED] potassium chloride  40 mEq Oral Once   Continuous Infusions:   . sodium chloride 10 mL/hr at 06/09/12 0800     Annakate Soulier, DO  [facility] Pager [phone]  If 7PM-7AM, please contact night-coverage [email or web address] Password [email or web address] 06/12/2012, 9:24 AM   LOS: 5 days

## 2012-06-12 NOTE — Progress Notes (Signed)
Pt's requesting something to help with sore throat. Dr Tat notified and ordered magic mouthwash.  Amanda Pea, RN

## 2012-06-12 NOTE — Progress Notes (Signed)
Pt overlay mattress not yet on floor.  Charge nurse Juanita informed and called to have delivered to pt's.  Amanda Pea, Charity fundraiser.

## 2012-06-12 NOTE — Progress Notes (Signed)
Patient ID: Juan Wolf, male   DOB: 1922-01-05, 78 y.o.   MRN: [id number]   SUBJECTIVE:  Patient continues to be weak. Appreciate the help from palliative care. He is still volume overloaded.   Filed Vitals:   06/11/12 1020 06/11/12 1453 06/11/12 2026 06/12/12 0510  BP: 118/67 127/49 131/48 119/60  Pulse: 97 103 82 61  Temp:  98 F (36.7 C) 97.1 F (36.2 C) 97 F (36.1 C)  TempSrc:  Oral Oral Axillary  Resp:  20 18 18   Height:      Weight:    195 lb 15.8 oz (88.9 kg)  SpO2:  96% 98% 98%    Intake/Output Summary (Last 24 hours) at 06/12/12 0938 Last data filed at 06/12/12 0830  Gross per 24 hour  Intake    603 ml  Output   2300 ml  Net  -1697 ml    LABS: Basic Metabolic Panel:  Basename 06/12/12 0500 06/11/12 0936 06/10/12 0500  NA -- 139 139  K -- 3.3* 3.2*  CL -- 100 101  CO2 -- 23 22  GLUCOSE -- 162* 101*  BUN -- 89* 101*  CREATININE -- 2.96* 3.37*  CALCIUM -- 8.3* 8.2*  MG 2.4 -- --  PHOS -- -- --   Liver Function Tests: No results found for this basename: AST:2,ALT:2,ALKPHOS:2,BILITOT:2,PROT:2,ALBUMIN:2 in the last 72 hours No results found for this basename: LIPASE:2,AMYLASE:2 in the last 72 hours CBC: No results found for this basename: WBC:2,NEUTROABS:2,HGB:2,HCT:2,MCV:2,PLT:2 in the last 72 hours Cardiac Enzymes: No results found for this basename: CKTOTAL:3,CKMB:3,CKMBINDEX:3,TROPONINI:3 in the last 72 hours BNP: No components found with this basename: POCBNP:3 D-Dimer: No results found for this basename: DDIMER:2 in the last 72 hours Hemoglobin A1C: No results found for this basename: HGBA1C in the last 72 hours Fasting Lipid Panel: No results found for this basename: CHOL,HDL,LDLCALC,TRIG,CHOLHDL,LDLDIRECT in the last 72 hours Thyroid Function Tests: No results found for this basename: TSH,T4TOTAL,FREET3,T3FREE,THYROIDAB in the last 72 hours  RADIOLOGY: US Renal  06/08/2012  *RADIOLOGY REPORT*  Clinical Data: Acute renal failure.   RENAL/URINARY TRACT ULTRASOUND COMPLETE  Comparison:  Previous abdomen ultrasound.  Findings:  Right Kidney:  Normal, measuring 11.2 cm in length.  No hydronephrosis.  Left Kidney:  Normal, measuring 10.1 cm in length.  No hydronephrosis.  Bladder:  Empty, with a Foley catheter in place.  IMPRESSION: Normal examination.   Original Report Authenticated By: Beckie Salts, M.D.    Dg Chest Port 1 View  06/09/2012  *RADIOLOGY REPORT*  Clinical Data: Cough and congestion.  PORTABLE CHEST - 1 VIEW  Comparison: 06/07/2012.  Findings: The heart remains enlarged.  There is persistent central vascular congestion, mild interstitial pulmonary edema, bilateral pleural effusions and bibasilar atelectasis.  Overall slight improved aeration may suggest resolving edema.  IMPRESSION: Persistent but slightly improved CHF.   Original Report Authenticated By: Rudie Meyer, M.D.    Dg Chest Port 1 View  06/07/2012  *RADIOLOGY REPORT*  Clinical Data: Congestive heart failure.  PORTABLE CHEST - 1 VIEW  Comparison: Chest 06/04/2012.  Findings: Bilateral pleural effusions and basilar airspace disease persist.  The patient's right effusion has worsened.  There is cardiomegaly and interstitial edema.  IMPRESSION: Increased interstitial edema.  Bilateral pleural effusions persist and the right effusion is larger than on the prior study.   Original Report Authenticated By: Holley Dexter, M.D.     PHYSICAL EXAM  Patient is oriented. He is lying relatively flat in bed. Lung exam reveals diffuse rales. Cardiac exam reveals  an S1-S2 and his murmur of aortic stenosis. The abdomen is soft. There is trace peripheral edema.   TELEMETRY:  I have reviewed telemetry today June 12, 2012. There is atrial fibrillation with controlled rate. The patient is having scattered ventricular ectopy. He did have one 4 beat run of ventricular tachycardia. We are waiting for labs ordered for today to make decisions about his potassium.   ASSESSMENT  AND PLAN:   Aortic stenosis    As outlined before, he is not a candidate for aortic valve surgery. At one point we had felt that he might be a candidate for TAVR if his overall status were to improve. This does not appear to be an option at this time.   Acute on chronic diastolic CHF (congestive heart failure),    The patient is diuresing. He has rales diffusely. Diuresis will have to be continued. I have outlined In prior notes that we will have to leave him on the wet side ultimately. If not he will have recurrent syncope related to his aortic stenosis and other problems.   Ventricular tachycardia    Potassium needs to be optimized.  Otherwise his rhythm is to be followed.   Renal insufficiency     Renal function was improving yesterday but today's labs are pending.    Atrial fibrillation    Atrial fibrillation rate is controlled.   Syncope     No further workup of his syncope. This was multifactorial when it led to his hospitalization.    Shock liver   LFTs will have to be followed again at some point.   Palliative care    The palliative care team is following him carefully.  [name] 06/12/2012 9:38 AM

## 2012-06-13 DIAGNOSIS — D649 Anemia, unspecified: Secondary | ICD-10-CM

## 2012-06-13 LAB — COMPREHENSIVE METABOLIC PANEL
Albumin: 2.7 g/dL — ABNORMAL LOW (ref 3.5–5.2)
BUN: 75 mg/dL — ABNORMAL HIGH (ref 6–23)
Chloride: 100 mEq/L (ref 96–112)
Creatinine, Ser: 2.31 mg/dL — ABNORMAL HIGH (ref 0.50–1.35)
GFR calc Af Amer: 27 mL/min — ABNORMAL LOW (ref >90)
Glucose, Bld: 102 mg/dL — ABNORMAL HIGH (ref 70–99)
Total Bilirubin: 1.4 mg/dL — ABNORMAL HIGH (ref 0.3–1.2)

## 2012-06-13 LAB — URINALYSIS, MICROSCOPIC ONLY
Glucose, UA: NEGATIVE mg/dL
Nitrite: NEGATIVE
Protein, ur: NEGATIVE mg/dL
Urobilinogen, UA: 1 mg/dL (ref 0.0–1.0)

## 2012-06-13 MED ORDER — FUROSEMIDE 80 MG PO TABS
80.0000 mg | ORAL_TABLET | Freq: Every day | ORAL | Status: DC
  Administered 2012-06-14 – 2012-06-19 (×6): 80 mg via ORAL
  Filled 2012-06-13 (×6): qty 1

## 2012-06-13 NOTE — Progress Notes (Addendum)
[facility] PROGRESS NOTE  Juan Wolf ZOX:[id number] DOB: 1921/09/03 DOA: 06/07/2012 PCP: Ignatius Specking., MD  Assessment/Plan: Acute on Chronic diastolic CHF  -neg 1054cc/neg 8.1XB for the admission  -renal function improving  -continue furosemide IV--> switch to po furosemide December 1 per cardiology -EF 50%  -DNR--possible residential hospice--Dr. Ladona Ridgel following Acute on chronic renal failure  -improvement with diuresis  -f/u am BMP  Baseline crt ~1.2-1.3 - likely due to hypoperfusion  -no acute indication for HD - normal renal US  Acute hepatitis / transaminitis / coagulopathy  - passive hepatic congestion due to severe R heart failure  -exacerbation related to hypoperfusion due to volume depletion  -improving  Pulm HTN / R Heart failure/ Cor Pulmonale  RV severely dilated and hypokinetic via echo 11/22  11/22 TTE >> mod LVH, LVEF 50%, RV dilation, RA E, LAE, mod-severe AS, mild AI, IVC dilation, mod to severe RV dysfunction  -leave on "wet" side Suprapubic discomfort  -check ua and urine culture Lethargy/altered mental status  More lucid - likely due to uremia - cont to hydrate per cards- normal B12 -normal folate level  -Urinalysis does not suggest UTI (11/25)  Mod-severe Aortic stenosis  valve area 1.05 via cath Dec 2012 - clearly not a candidate for tx/intervention  A fib/flutter  S/p RFCA - no anticoag due to hx of GIB - rate controlled  COPD  Well compensated at present   CAD  Status post balloon angioplasty, 1987, 4-vessel CABG, 1997, Bare metal stenting, SVG-RCA and SVG-LAD/diagonal graft, 06/2011, BMS stent to mid body of SVG to OM2 12/18/2011 - Cardiology is following  syncope  Due to volume depeletion in setting of hypotension + RHF + AoS  HTN  Not an active issue at this time  Hypokalemia  -replete  -mag--2.4  thrombyocytopenia  -likely due to chronic hepatic congestion from cor pulmonale  No spontaneous bleeding noted - follow trend  Code  Status: DNR  Consultants:  PCCM  Cards - [place]       Family Communication:   Son at bedside Disposition Plan:   SNF vs residential hospice      Procedures/Studies: US Renal  06/08/2012  *RADIOLOGY REPORT*  Clinical Data: Acute renal failure.  RENAL/URINARY TRACT ULTRASOUND COMPLETE  Comparison:  Previous abdomen ultrasound.  Findings:  Right Kidney:  Normal, measuring 11.2 cm in length.  No hydronephrosis.  Left Kidney:  Normal, measuring 10.1 cm in length.  No hydronephrosis.  Bladder:  Empty, with a Foley catheter in place.  IMPRESSION: Normal examination.   Original Report Authenticated By: Beckie Salts, M.D.    Dg Chest Port 1 View  06/09/2012  *RADIOLOGY REPORT*  Clinical Data: Cough and congestion.  PORTABLE CHEST - 1 VIEW  Comparison: 06/07/2012.  Findings: The heart remains enlarged.  There is persistent central vascular congestion, mild interstitial pulmonary edema, bilateral pleural effusions and bibasilar atelectasis.  Overall slight improved aeration may suggest resolving edema.  IMPRESSION: Persistent but slightly improved CHF.   Original Report Authenticated By: Rudie Meyer, M.D.    Dg Chest Port 1 View  06/07/2012  *RADIOLOGY REPORT*  Clinical Data: Congestive heart failure.  PORTABLE CHEST - 1 VIEW  Comparison: Chest 06/04/2012.  Findings: Bilateral pleural effusions and basilar airspace disease persist.  The patient's right effusion has worsened.  There is cardiomegaly and interstitial edema.  IMPRESSION: Increased interstitial edema.  Bilateral pleural effusions persist and the right effusion is larger than on the prior study.   Original Report Authenticated By: Maisie Fus  Maricela Curet, M.D.          Subjective: Patient complains of some suprapubic discomfort. Denies any nausea, vomiting, shortness of breath, chest pain, abdominal pain. There's been no reports of vomiting or diarrhea or respiratory distress..  Objective: Filed Vitals:   06/12/12 0510 06/12/12 1415  06/12/12 1949 06/13/12 0511  BP: 119/60 137/82 131/73 120/81  Pulse: 61 72 90 63  Temp: 97 F (36.1 C) 97.6 F (36.4 C) 97.2 F (36.2 C) 97.1 F (36.2 C)  TempSrc: Axillary Oral Axillary Oral  Resp: 18 18 18 18   Height:      Weight: 88.9 kg (195 lb 15.8 oz)   90.2 kg (198 lb 13.7 oz)  SpO2: 98% 97% 100% 100%    Intake/Output Summary (Last 24 hours) at 06/13/12 1148 Last data filed at 06/13/12 0900  Gross per 24 hour  Intake   1080 ml  Output   1200 ml  Net   -120 ml   Weight change: 1.3 kg (2 lb 13.9 oz) Exam:   General:  Pt is alert, follows commands appropriately, not in acute distress  HEENT: No icterus, No thrush,  [place]/AT  Cardiovascular: IRRR,  no rubs, no gallops  Respiratory: Bibasilar crackles. No wheezes or rhonchi. Good air movement.  Abdomen: Soft/+BS,  non distended, no guarding; mild suprapubic tenderness without any guarding or peritoneal signs  Extremities: 2+ edema, No lymphangitis, No petechiae, No rashes, no synovitis  Data Reviewed: Basic Metabolic Panel:  Lab 06/13/12 1610 06/12/12 0500 06/11/12 0936 06/10/12 0500 06/09/12 0425  NA 139 140 139 139 137  K 3.7 3.0* 3.3* 3.2* 3.9  CL 100 101 100 101 99  CO2 26 25 23 22 21   GLUCOSE 102* 103* 162* 101* 115*  BUN 75* 83* 89* 101* 102*  CREATININE 2.31* 2.77* 2.96* 3.37* 3.90*  CALCIUM 9.0 8.6 8.3* 8.2* 8.0*  MG -- 2.4 -- -- --  PHOS -- -- -- -- 7.2*   Liver Function Tests:  Lab 06/13/12 0530 06/12/12 1215 06/09/12 0910 06/09/12 0425 06/08/12 0905 06/07/12 1555  AST 101* 175* 1147* -- 1504* 1595*  ALT 334* 452* 1153* -- 1312* 1270*  ALKPHOS 125* 128* 128* -- 129* 125*  BILITOT 1.4* 1.3* 2.0* -- 3.1* 3.2*  PROT 6.6 6.4 6.3 -- 6.6 6.8  ALBUMIN 2.7* 2.8* 3.0* 2.9* 3.2* --   No results found for this basename: LIPASE:5,AMYLASE:5 in the last 168 hours  Lab 06/07/12 1555  AMMONIA 36   CBC:  Lab 06/09/12 0425 06/08/12 0905 06/07/12 1555  WBC 6.1 6.4 7.5  NEUTROABS -- -- --  HGB 10.4* 10.7*  10.9*  HCT 34.3* 36.0* 36.4*  MCV 84.1 84.3 84.5  PLT 41* 54* 68*   Cardiac Enzymes: No results found for this basename: CKTOTAL:5,CKMB:5,CKMBINDEX:5,TROPONINI:5 in the last 168 hours BNP: No components found with this basename: POCBNP:5 CBG:  Lab 06/07/12 1407  GLUCAP 87    Recent Results (from the past 240 hour(s))  MRSA PCR SCREENING     Status: Normal   Collection Time   06/07/12  2:42 PM      Component Value Range Status Comment   MRSA by PCR NEGATIVE  NEGATIVE Final   URINE CULTURE     Status: Normal   Collection Time   06/08/12 10:32 AM      Component Value Range Status Comment   Specimen Description URINE, CATHETERIZED   Final    Special Requests Normal   Final    Culture  Setup Time 06/08/2012  11:00   Final    Colony Count NO GROWTH   Final    Culture NO GROWTH   Final    Report Status 06/09/2012 FINAL   Final      Scheduled Meds:   . docusate sodium  100 mg Oral BID  . feeding supplement  1 Container Oral TID BM  . furosemide  40 mg Intravenous BID  . furosemide  80 mg Oral Daily  . gabapentin  200 mg Oral QHS  . magic mouthwash  10 mL Oral TID  . [COMPLETED] potassium chloride  10 mEq Intravenous Q1 Hr x 2  . [COMPLETED] potassium chloride  40 mEq Oral Once  . sodium chloride  3 mL Intravenous Q12H   Continuous Infusions:   . sodium chloride 10 mL/hr at 06/09/12 0800     Serita Degroote, DO  [facility] Pager [phone]  If 7PM-7AM, please contact night-coverage [email or web address] Password [email or web address] 06/13/2012, 11:48 AM   LOS: 6 days

## 2012-06-13 NOTE — Progress Notes (Signed)
PROGRESS NOTE  Subjective:   Juan Wolf is an 78 yo with hx of CABG, aortic stenosis, GI bleed, CKD  Objective:    Vital Signs:   Temp:  [97.1 F (36.2 C)-97.6 F (36.4 C)] 97.1 F (36.2 C) (11/30 0511) Pulse Rate:  [63-90] 63  (11/30 0511) Resp:  [18] 18  (11/30 0511) BP: (120-137)/(73-82) 120/81 mmHg (11/30 0511) SpO2:  [97 %-100 %] 100 % (11/30 0511) Weight:  [198 lb 13.7 oz (90.2 kg)] 198 lb 13.7 oz (90.2 kg) (11/30 0511)  Last BM Date: 06/12/12   24-hour weight change: Weight change: 2 lb 13.9 oz (1.3 kg)  Weight trends: Filed Weights   06/11/12 0653 06/12/12 0510 06/13/12 0511  Weight: 200 lb 2.8 oz (90.8 kg) 195 lb 15.8 oz (88.9 kg) 198 lb 13.7 oz (90.2 kg)    Intake/Output:  11/29 0701 - 11/30 0700 In: 860 [P.O.:740; IV Piggyback:120] Out: 900 [Urine:900] Total I/O In: 360 [P.O.:360] Out: 300 [Urine:300]   Physical Exam: BP 120/81  Pulse 63  Temp 97.1 F (36.2 C) (Oral)  Resp 18  Ht 5\' 9"  (1.753 m)  Wt 198 lb 13.7 oz (90.2 kg)  BMI 29.37 kg/m2  SpO2 100%  General: Vital signs reviewed and noted. , patient is very weak.  Has not walked in a week., voice is weak  Head: Normocephalic, atraumatic.  Eyes: conjunctivae/corneas clear.  EOM's intact.   Throat: normal  Neck: thick  Lungs:   Clear anteriorly  Heart: Irregular, 3/6 systolic murmur  Abdomen:  Soft, non-tender, non-distended  , + bowel sounds  Extremities: Diffuse 2 + edema   Neurologic: A&O X3, CN II - XII are grossly intact. , he appears very weak.  Not able to do much for himself  Psych:     Labs: BMET:  Basename 06/13/12 0530 06/12/12 0500  NA 139 140  K 3.7 3.0*  CL 100 101  CO2 26 25  GLUCOSE 102* 103*  BUN 75* 83*  CREATININE 2.31* 2.77*  CALCIUM 9.0 8.6  MG -- 2.4  PHOS -- --    Liver function tests:  Basename 06/13/12 0530 06/12/12 1215  AST 101* 175*  ALT 334* 452*  ALKPHOS 125* 128*  BILITOT 1.4* 1.3*  PROT 6.6 6.4  ALBUMIN 2.7* 2.8*   No results found  for this basename: LIPASE:2,AMYLASE:2 in the last 72 hours  CBC: No results found for this basename: WBC:2,NEUTROABS:2,HGB:2,HCT:2,MCV:2,PLT:2 in the last 72 hours  Cardiac Enzymes: No results found for this basename: CKTOTAL:4,CKMB:4,TROPONINI:4 in the last 72 hours  Coagulation Studies: No results found for this basename: LABPROT:5,INR:5 in the last 72 hours    Tele:    Medications:    Infusions:    . sodium chloride 10 mL/hr at 06/09/12 0800    Scheduled Medications:    . docusate sodium  100 mg Oral BID  . feeding supplement  1 Container Oral TID BM  . furosemide  40 mg Intravenous BID  . gabapentin  200 mg Oral QHS  . magic mouthwash  10 mL Oral TID  . [COMPLETED] potassium chloride  10 mEq Intravenous Q1 Hr x 2  . [COMPLETED] potassium chloride  40 mEq Oral Once  . sodium chloride  3 mL Intravenous Q12H    Assessment/ Plan:    HYPERTENSION (12/28/2008)  COPD (12/28/2008)  CAD (coronary artery disease) (05/28/2011) Stable  Aortic stenosis (06/05/2011)  I agree that he is a poor candidate for AVR or even TARV.  I agree with  comfort care.  Acute on chronic diastolic CHF (congestive heart failure), NYHA class 1 (12/21/2011) Stable  Ventricular tachycardia (12/21/2011) Stable  Anemia (12/21/2011)   Atrial fibrillation (12/31/2011)   Syncope (06/07/2012) Likely due to hypotension from his aortic stenosis Agree with the idea that he will need to be slightly "wet" rather than on the dry side.  Acute on chronic renal failure (06/07/2012) Creatinine is actually better    Disposition: continue comfort care Length of Stay: 6  Vesta Mixer, Montez Hageman., MD, [facility] 06/13/2012, 10:11 AM Office [phone] Pager [phone]

## 2012-06-13 NOTE — Progress Notes (Signed)
Patient Juan Wolf      DOB: December 17, 1921      WUJ:[id number]   Palliative Medicine Team at [facility] Progress Note    Subjective: patient sleeping when I original checked on him.  Family has arrived and he is now up and eating a gravy biscuit.  He states his back pain is better today.  His breathing feels easy and his throat is improved.  He remains very weak.  I updated son and DIL at bedside.  Awaiting call from [facility] regarding re-meet to discuss options for care.  Patient with slight improvement but overall condition not amenable to discharge to home.       Filed Vitals:   06/13/12 0511  BP: 120/81  Pulse: 63  Temp: 97.1 F (36.2 C)  Resp: 18   Physical exam:  General:  More clarity today PERRL, EOMI, anicteric MM dry throat red Chest decreased but clear CVS: irr, S1, S2 Abd: soft, obese, not tender Ext: no edema, Neuro: slightly confused to time, knows general place and his family. Very weak unable to even feed himself.  Lab Results  Component Value Date   CREATININE 2.31* 06/13/2012   BUN 75* 06/13/2012   NA 139 06/13/2012   K 3.7 06/13/2012   CL 100 06/13/2012   CO2 26 06/13/2012   Lab Results  Component Value Date   WBC 6.1 06/09/2012   HGB 10.4* 06/09/2012   HCT 34.3* 06/09/2012   MCV 84.1 06/09/2012   PLT 41* 06/09/2012   Discussed with Dr. Elease Hashimoto will switch to po lasix in the am to see how he tolerates this.  I believe that he will likely still need residential hospice placement despite small improvement, but seeing how he does on oral medications will be helpful.   Assessment and plan: 78  Year old White male with advanced aortic stenosis.  He was admitted with Chf, syncope likely secondary to hypotension which resulted in shock liver, and acute on chronic renal failure.  Patient is slowly improving from renal failure and shock liver but he remains in a very tenuous state.  He is too weak to perform ADL's and remains with a creatinine above 1.  DNR/ DNI  2.  Plan to try to switch to oral lasix, confirmed with Dr. Elease Hashimoto will start 80 mg PO q day in the am to see how quickly he reaccumulates fluid.  This will help family make decisions. Juan Wolf feels that his dad will have a hard time accepting hospice but is open to this.  We plan to try to talk with him about this transition.  Family is to call with a time to remeet.  3.  Pain in back improvee. Continue prn medications.  Total Time: 25 min  Discussed with Dr. Arbutus Leas.  Juan Craun L. Ladona Ridgel, MD MBA The Palliative Medicine Team at [facility] Phone: [phone] Pager: [phone]

## 2012-06-14 DIAGNOSIS — F329 Major depressive disorder, single episode, unspecified: Secondary | ICD-10-CM | POA: Diagnosis present

## 2012-06-14 LAB — COMPREHENSIVE METABOLIC PANEL
AST: 73 U/L — ABNORMAL HIGH (ref 0–37)
Albumin: 2.6 g/dL — ABNORMAL LOW (ref 3.5–5.2)
Alkaline Phosphatase: 120 U/L — ABNORMAL HIGH (ref 39–117)
Chloride: 99 mEq/L (ref 96–112)
Potassium: 3.8 mEq/L (ref 3.5–5.1)
Sodium: 138 mEq/L (ref 135–145)
Total Bilirubin: 1.5 mg/dL — ABNORMAL HIGH (ref 0.3–1.2)
Total Protein: 6.4 g/dL (ref 6.0–8.3)

## 2012-06-14 NOTE — Progress Notes (Signed)
PROGRESS NOTE  Subjective:   Juan Wolf is an 78 yo with hx of CABG, aortic stenosis, GI bleed, CKD,  He has not eaten well today.  Objective:    Vital Signs:   Temp:  [97.3 F (36.3 C)-97.6 F (36.4 C)] 97.3 F (36.3 C) (12/01 0407) Pulse Rate:  [72-79] 77  (12/01 0407) Resp:  [18-19] 18  (12/01 0407) BP: (118-139)/(66-77) 120/77 mmHg (12/01 0407) SpO2:  [98 %-100 %] 98 % (12/01 0407) Weight:  [198 lb 3.1 oz (89.9 kg)] 198 lb 3.1 oz (89.9 kg) (12/01 0407)  Last BM Date: 06/12/12   24-hour weight change: Weight change: -10.6 oz (-0.3 kg)  Weight trends: Filed Weights   06/12/12 0510 06/13/12 0511 06/14/12 0407  Weight: 195 lb 15.8 oz (88.9 kg) 198 lb 13.7 oz (90.2 kg) 198 lb 3.1 oz (89.9 kg)    Intake/Output:  11/30 0701 - 12/01 0700 In: 940 [P.O.:940] Out: 1650 [Urine:1650] Total I/O In: 360 [P.O.:360] Out: -    Physical Exam: BP 120/77  Pulse 77  Temp 97.3 F (36.3 C) (Oral)  Resp 18  Ht 5\' 9"  (1.753 m)  Wt 198 lb 3.1 oz (89.9 kg)  BMI 29.27 kg/m2  SpO2 98%  General: Vital signs reviewed and noted. , patient is very weak.  Has not walked in a week., voice is weak  Head: Normocephalic, atraumatic.  Eyes: conjunctivae/corneas clear.  EOM's intact.   Throat: normal  Neck: thick  Lungs:   Clear anteriorly  Heart: Irregular, 3/6 systolic murmur  Abdomen:  Soft, non-tender, non-distended  , + bowel sounds  Extremities: Diffuse 2 + edema   Neurologic: A&O X3, CN II - XII are grossly intact. , he appears very weak.  Not able to do much for himself  Psych:     Labs: BMET:  Basename 06/14/12 0521 06/13/12 0530 06/12/12 0500  NA 138 139 --  K 3.8 3.7 --  CL 99 100 --  CO2 24 26 --  GLUCOSE 104* 102* --  BUN 69* 75* --  CREATININE 2.06* 2.31* --  CALCIUM 8.6 9.0 --  MG -- -- 2.4  PHOS -- -- --    Liver function tests:  Basename 06/14/12 0521 06/13/12 0530  AST 73* 101*  ALT 246* 334*  ALKPHOS 120* 125*  BILITOT 1.5* 1.4*  PROT 6.4 6.6    ALBUMIN 2.6* 2.7*   No results found for this basename: LIPASE:2,AMYLASE:2 in the last 72 hours  CBC: No results found for this basename: WBC:2,NEUTROABS:2,HGB:2,HCT:2,MCV:2,PLT:2 in the last 72 hours  Cardiac Enzymes: No results found for this basename: CKTOTAL:4,CKMB:4,TROPONINI:4 in the last 72 hours  Coagulation Studies: No results found for this basename: LABPROT:5,INR:5 in the last 72 hours    Tele:    Medications:    Infusions:    . sodium chloride 10 mL/hr at 06/09/12 0800    Scheduled Medications:    . docusate sodium  100 mg Oral BID  . feeding supplement  1 Container Oral TID BM  . [COMPLETED] furosemide  40 mg Intravenous BID  . furosemide  80 mg Oral Daily  . gabapentin  200 mg Oral QHS  . magic mouthwash  10 mL Oral TID  . sodium chloride  3 mL Intravenous Q12H    Assessment/ Plan:    HYPERTENSION (12/28/2008)  COPD (12/28/2008)  CAD (coronary artery disease) (05/28/2011) Stable  Aortic stenosis (06/05/2011)  I agree that he is a poor candidate for AVR or even TARV.  I  agree with comfort care. No further recs at this time.   Acute on chronic diastolic CHF (congestive heart failure), NYHA class 1 (12/21/2011) Stable  Ventricular tachycardia (12/21/2011) Stable  Anemia (12/21/2011)   Atrial fibrillation (12/31/2011)   Syncope (06/07/2012) Likely due to hypotension from his aortic stenosis Agree with the idea that he will need to be slightly "wet" rather than on the dry side.  Acute on chronic renal failure (06/07/2012) Creatinine is actually better    Disposition: continue comfort care Length of Stay: 7  Vesta Mixer, Montez Hageman., MD, [facility] 06/14/2012, 11:45 AM Office [phone] Pager [phone]

## 2012-06-14 NOTE — Consult Note (Signed)
Reason for Consult: Suicidal ideation Referring Physician: Catarina Hartshorn, M.D.  Juan Wolf is an 78 y.o. male.  HPI:  Juan Wolf is an 78 y.o. Male  with no  past psychiatry history significant. The patient is referred for psychiatric services for psychiatric evaluation and medication management.   The patient reports that his main stressors are: the worsening of his physical  Health over the past year, and the death of his wife of 64 years last year.  In the area of affective symptoms, patient appears depressed. Patient endorses current suicidal ideation, intent, or plan. Patient denies current homicidal ideation, intent, or plan. Patient endorses auditory hallucinations. Patient endorses visual hallucinations. Patient denies symptoms of paranoia. Patient states sleep is poor.Marland Kitchen Appetite is good. Energy level is poor. Patient endorses symptoms of anhedonia. Patient endorses hopelessness, helplessness, and guilt (about how he would tease other people.)   Denies any recent episodes consistent with mania, particularly decreased need for sleep with increased energy, grandiosity, impulsivity, hyperverbal and pressured speech, or increased productivity. Denies any recent symptoms consistent with psychosis, particularly auditory or visual hallucinations, thought broadcasting/insertion/withdrawal, or ideas of reference. Also denies excessive worry to the point of physical symptoms as well as any panic attacks. Denies any history of trauma or symptoms consistent with PTSD such as flashbacks, nightmares, hypervigilance, feelings of numbness or inability to connect with others.   The patient's son is present to corroborate the patient's statements.   Past Psychiatric History:  Diagnosis: Patient denies  Hospitalizations: Patient denies  Outpatient Care: Patient denies  Substance Abuse Care: Patient denies   Self-Mutilation:Patient denies  Suicidal Attempts: Patient denies  Violent Behaviors:  Patient denies    Previous Psychotropic Medications:  Patient denies.  Family Psychiatric History:  Psychiatric Illness: Patient denies Substance Abuse: Brother had previously suffered from alcoholism. Suicide Attempts: Patient denies  Past Medical History  Diagnosis Date  . Umbilical hernia without mention of obstruction or gangrene   . Other and unspecified hyperlipidemia   . COPD (chronic obstructive pulmonary disease)   . Hypertension   . Cramp of limb   . Atrial flutter     s/p RFCA   . Atrial fibrillation     tikosyn rx d/c in setting of VT (possibly ischemia mediated though)  . CAD (coronary artery disease)     a) Status post balloon angioplasty, 1987, b) 4-vessel CABG, 1997, c) Bare metal stenting, SVG-RCA and SVG-LAD/diagonal graft, 06/2011, d) BMS stent to mid body of SVG to OM2 12/18/2011   . Aortic stenosis 06/05/2011  . Chronic kidney disease     CKD  . Complication of anesthesia     " The doctor told me not to let anyone put me to sleep"  . Myocardial infarction   . GERD (gastroesophageal reflux disease)   . Arthritis   . Chronic diastolic heart failure   . Ventricular tachycardia     12/2011  . Pulmonary HTN     Past Surgical History  Procedure Date  . Appendectomy   . Back surgery   . Cholecystectomy   . Hernia repair   . Cardiac catheterization 06/19/2011    Moderate pulmonary hypertension, moderate AS (valve area: 1.05), severe 3 vessel CAD. 90% stensis in both SVG to LAD/diagonal and SVG to RCA. Patent SVG to OM.   Marland Kitchen Coronary angioplasty 06/19/2011    BMS to both SVG to RCA and SVG to LAD/diagonal  . Coronary artery bypass graft     History reviewed. No pertinent  family history.  Social History:  reports that he quit smoking about 16 years ago. His smoking use included Cigars. He quit smokeless tobacco use about 67 years ago. His smokeless tobacco use included Chew. He reports that he does not drink alcohol or use illicit drugs.  Allergies:    Allergies  Allergen Reactions  . Contrast Media (Iodinated Diagnostic Agents)     Using during MRI    Medications: I have reviewed the patient's current medications.    . docusate sodium  100 mg Oral BID  . feeding supplement  1 Container Oral TID BM  . furosemide  80 mg Oral Daily  . gabapentin  200 mg Oral QHS  . magic mouthwash  10 mL Oral TID  . sodium chloride  3 mL Intravenous Q12H    acetaminophen, acetaminophen, albuterol, fluticasone, nitroGLYCERIN, ondansetron (ZOFRAN) IV, ondansetron, oxyCODONE, zolpidem  Results for orders placed during the hospital encounter of 06/07/12 (from the past 48 hour(s))  COMPREHENSIVE METABOLIC PANEL     Status: Abnormal   Collection Time   06/13/12  5:30 AM      Component Value Range Comment   Sodium 139  135 - 145 mEq/L    Potassium 3.7  3.5 - 5.1 mEq/L DELTA CHECK NOTED   Chloride 100  96 - 112 mEq/L    CO2 26  19 - 32 mEq/L    Glucose, Bld 102 (*) 70 - 99 mg/dL    BUN 75 (*) 6 - 23 mg/dL    Creatinine, Ser 1.61 (*) 0.50 - 1.35 mg/dL    Calcium 9.0  8.4 - 09.6 mg/dL    Total Protein 6.6  6.0 - 8.3 g/dL    Albumin 2.7 (*) 3.5 - 5.2 g/dL    AST 045 (*) 0 - 37 U/L    ALT 334 (*) 0 - 53 U/L    Alkaline Phosphatase 125 (*) 39 - 117 U/L    Total Bilirubin 1.4 (*) 0.3 - 1.2 mg/dL    GFR calc non Af Amer 24 (*) >90 mL/min    GFR calc Af Amer 27 (*) >90 mL/min   URINE CULTURE     Status: Normal (Preliminary result)   Collection Time   06/13/12  2:23 PM      Component Value Range Comment   Specimen Description URINE, RANDOM      Special Requests NONE      Culture  Setup Time 06/13/2012 18:20      Colony Count >=100,000 COLONIES/ML      Culture GRAM NEGATIVE RODS      Report Status PENDING     URINALYSIS, MICROSCOPIC ONLY     Status: Abnormal   Collection Time   06/13/12  2:24 PM      Component Value Range Comment   Color, Urine YELLOW  YELLOW    APPearance CLOUDY (*) CLEAR    Specific Gravity, Urine 1.012  1.005 - 1.030    pH  5.0  5.0 - 8.0    Glucose, UA NEGATIVE  NEGATIVE mg/dL    Hgb urine dipstick LARGE (*) NEGATIVE    Bilirubin Urine NEGATIVE  NEGATIVE    Ketones, ur NEGATIVE  NEGATIVE mg/dL    Protein, ur NEGATIVE  NEGATIVE mg/dL    Urobilinogen, UA 1.0  0.0 - 1.0 mg/dL    Nitrite NEGATIVE  NEGATIVE    Leukocytes, UA SMALL (*) NEGATIVE    WBC, UA 7-10  <3 WBC/hpf    RBC / HPF 3-6  <  3 RBC/hpf   COMPREHENSIVE METABOLIC PANEL     Status: Abnormal   Collection Time   06/14/12  5:21 AM      Component Value Range Comment   Sodium 138  135 - 145 mEq/L    Potassium 3.8  3.5 - 5.1 mEq/L SLIGHT HEMOLYSIS   Chloride 99  96 - 112 mEq/L    CO2 24  19 - 32 mEq/L    Glucose, Bld 104 (*) 70 - 99 mg/dL    BUN 69 (*) 6 - 23 mg/dL    Creatinine, Ser 1.61 (*) 0.50 - 1.35 mg/dL    Calcium 8.6  8.4 - 09.6 mg/dL    Total Protein 6.4  6.0 - 8.3 g/dL    Albumin 2.6 (*) 3.5 - 5.2 g/dL    AST 73 (*) 0 - 37 U/L    ALT 246 (*) 0 - 53 U/L    Alkaline Phosphatase 120 (*) 39 - 117 U/L    Total Bilirubin 1.5 (*) 0.3 - 1.2 mg/dL    GFR calc non Af Amer 27 (*) >90 mL/min    GFR calc Af Amer 31 (*) >90 mL/min     No results found.  Review of Systems  Constitutional: Negative for fever and chills.  Cardiovascular: Negative for chest pain and palpitations.  Gastrointestinal: Negative for nausea and vomiting.   Blood pressure 120/77, pulse 77, temperature 97.3 F (36.3 C), temperature source Oral, resp. rate 18, height 5\' 9"  (1.753 m), weight 89.9 kg (198 lb 3.1 oz), SpO2 98.00%. Physical Exam  Vitals reviewed. Constitutional: He appears well-developed and well-nourished.  Skin: He is not diaphoretic.   Substance Use:  Caffeine: Coffee, 2-3 cups; Soda 1-2 glasses Tobacco: Stopped smoking in 1997 Alcohol: Sopped drinking in 1994 Illicit Drugs: Patient denies.  Medical Consequences of Substance Abuse: Patient denies. Legal Consequences of Substance Abuse: Patient denies. Family Consequences of Substance Abuse: Patient  denies.   Social History:  Current Place of Residence: [place], [place] Place of Birth: [place], [place] Family Members: Patient has a large family, including  Three, sons, several, grandchildren and some great grandchildren Marital Status: Widower Children: 3 Sons: 3 Relationships: Patient reports his sons are his main source of emotional support Education:8 th grade Educational Problems/Performance:None, patient started working a 16.  Religious Beliefs/Practices: Patient is a Curator History of Abuse: Patient denies  Occupational Experiences: Retired Chiropodist History:  [employer] Legal History:Patient denies Hobbies/Interests: Spending time with family  Mental Status Examination/Evaluation:  Objective: Appearance:  Product/process development scientist:: good  Speech: Normal Rate, Slow   Volume: Decreased  Mood: "Sleepy" 4/10  (0-very depressed, 5 neutral, 10 Very happy)  Affect: Constricted  Thought Process: Coherent, Goal Directed, Logical  Orientation: Alert, oriented  Thought Content: WDL,  Suicidal Thoughts: Yes-  Homicidal Thoughts: No  Judgement: Poor  Insight: Poor  Psychomotor Activity: Decreased  Akathisia:No  Handed: Right handed,  Memory; Immediate 3/3, recent 1/3  Assets: Merchant navy officer  Housing  Resilience  Social Support    ASSESSMENT OF DANGER TO SELF:  Moderate-  SUICIDE RISK CHECKLIST:  Suicide ideation, Suicide plan, Sense of hopelessness,  Recent diagnosis and/or worsening of a significant medical illness, History of psychosis, Recent loss of spouse  PROTECTIVE FACTORS:  Evidence of accessible and positively motivated social supports  ASSESSMENT OF SUICIDE RISK:  Moderate  ASSESSMENT OF DANGER TO OTHERS:  No significant risk.   Assessment/Plan:  Axis I: Major Depressive Disorder, single episode  Axis II: No diagnosis Axis III: Heart Failure, Chronic Kidney disease, Pulmonary hyperension Axis IV: Death of  spouse Axis V: 30  PLAN: 1. Given continued reported suicidal ideation, will need to continue 1:1 2. Recommend initiation of sertraline 25-50 mg. 3. Recommend  Discontinuation of Ambien. 4. Psychiatry will follow. Garrin Kirwan 06/14/2012, 12:54 PM

## 2012-06-14 NOTE — Progress Notes (Addendum)
Patient ZO:[id number] Juan Wolf      DOB: Apr 13, 1922      WUJ:[id number]   Palliative Medicine Team at [facility] Progress Note    Subjective: Events of this am noted.  Patient states to me without asking that he is sad and depressed and does not want to live like this.  He asks me if he has had a stroke and I reiterated that his heart has caused him to be very weak and unable to be who he was.  We talked about his worth and importance to his family and talked about the fact the he may be facing death which he had stated several days ago.  We tried to focus on the small positive things.   One of the things that encouraged him was thinking about getting closer to home in the future so that family could visit with him . I have been asked to wait on broaching the topic of hospice with him as he as said in the past to his family that "this is the end for people".  They were hesitant to talk with him and had requested that they be able to see how he did this weekend.  They were to call me when they were ready to talk with him.  Filed Vitals:   06/14/12 0407  BP: 120/77  Pulse: 77  Temp: 97.3 F (36.3 C)  Resp: 18   Physical exam:  General:  Sad, tearful,  Left eye is puffy and he keeps closing it, but does not state that it is painful and does not have loss of vision in the eye.  I cleansed it with a warm cloth. PERRL, mild arcus senilis L>R Chest : some course crackles at the bases right greater than left, no wheezing no dyspnea CVS: irregular, S1, S2 Abd: obese, not tender, ventral hernia has been present Ext: warm, areas of scattered ecchymosis,  Hands seem a little more puffy today. Neuro:  Oriented to himself,  Not time or place (although he states he sleeps better during the day it seems) he will barely move his arms when I ask him to help me clean his hands and nails.  Lab Results  Component Value Date   CREATININE 2.06* 06/14/2012   BUN 69* 06/14/2012   NA 138 06/14/2012   K 3.8 06/14/2012     CL 99 06/14/2012   CO2 24 06/14/2012     Assessment and plan:  77 year old white male with severe aortic stenosis resulting in cardiac insuffiencey and chf.  He had suffered an acute syncopal event with shock liver and acute on chronic renal failure.  Resulting in near death experience and now severe life limiting debility.  He is depressed an making rash statements about committing suicide. I will again ask his family to meet with me with him to try to assist on a disposition and help with his depression.  1.  DNR/ DNI  2.  Suicidal with atypical features: hopelessness and insomnia.  I would recommend psych to see the patient prior to discharge .  He has a complex cardiac history that might preclude using SSRI.  I will leave this up to his primary MD and try to work on the CBT with him.  He might benefit from medication as it seems that he is at least holding his own and not actively dying currently.  He may have a few more weeks/ months.  3.  Back pain: improved on current mattress and  prn opiates.  4.  Failure to thrive:  He seems to respond better with family feeding him and bring things that he likes.  Would recommend consideration for transition to hospice home with his family as they will be closer and can support him better.   5.  Insomnia: Ambien seems to help a little bit and I have not seen adverse effects. Can continue.  6.  Obstipation:  Patient's last movement was 11/19 will add senna if he does not have movement in next 24 hours.  Continue colace. Total time:  20 min   Juan Ottaway L. Ladona Ridgel, MD MBA The Palliative Medicine Team at [facility] Phone: [phone] Pager: [phone]

## 2012-06-14 NOTE — Progress Notes (Signed)
[facility] PROGRESS NOTE  FRIEND VANDERPOEL ZOX:[id number] DOB: [birth year] DOA: 06/07/2012 PCP: Ignatius Specking., MD  Assessment/Plan: Acute on Chronic diastolic CHF  -neg 1824cc/neg 8.1XB for the admission  -renal function improving  -continue furosemide IV--> switch to po furosemide per cardiology  -EF 50%  -DNR--possible residential hospice--Dr. Ladona Ridgel following  Depression/Suicidal ideation -Consulted psychiatry today -Consider discontinuing Ambien as this may contribute to depression/emotional lability Acute on chronic renal failure  -continues to improve with diuresis  Baseline crt ~1.2-1.3 - likely due to hypoperfusion  Acute hepatitis / transaminitis / coagulopathy  - passive hepatic congestion due to severe R heart failure  -exacerbation related to hypoperfusion due to volume depletion  -improving  Pulm HTN / R Heart failure/ Cor Pulmonale  RV severely dilated and hypokinetic via echo 11/22  11/22 TTE >> mod LVH, LVEF 50%, RV dilation, RA E, LAE, mod-severe AS, mild AI, IVC dilation, mod to severe RV dysfunction  -leave on "wet" side  Suprapubic discomfort  -improved -repeat UA does not suggest UTI Lethargy/altered mental status  More lucid          Code Status: DNR  Consultants:  PCCM  Cards - [place]  Palliative care        Family Communication:   Son (jimmy) at beside Disposition Plan:   SNF vs residential hospice      Procedures/Studies: US Renal  06/08/2012  *RADIOLOGY REPORT*  Clinical Data: Acute renal failure.  RENAL/URINARY TRACT ULTRASOUND COMPLETE  Comparison:  Previous abdomen ultrasound.  Findings:  Right Kidney:  Normal, measuring 11.2 cm in length.  No hydronephrosis.  Left Kidney:  Normal, measuring 10.1 cm in length.  No hydronephrosis.  Bladder:  Empty, with a Foley catheter in place.  IMPRESSION: Normal examination.   Original Report Authenticated By: Beckie Salts, M.D.    Dg Chest Port 1 View  06/09/2012  *RADIOLOGY  REPORT*  Clinical Data: Cough and congestion.  PORTABLE CHEST - 1 VIEW  Comparison: 06/07/2012.  Findings: The heart remains enlarged.  There is persistent central vascular congestion, mild interstitial pulmonary edema, bilateral pleural effusions and bibasilar atelectasis.  Overall slight improved aeration may suggest resolving edema.  IMPRESSION: Persistent but slightly improved CHF.   Original Report Authenticated By: Rudie Meyer, M.D.    Dg Chest Port 1 View  06/07/2012  *RADIOLOGY REPORT*  Clinical Data: Congestive heart failure.  PORTABLE CHEST - 1 VIEW  Comparison: Chest 06/04/2012.  Findings: Bilateral pleural effusions and basilar airspace disease persist.  The patient's right effusion has worsened.  There is cardiomegaly and interstitial edema.  IMPRESSION: Increased interstitial edema.  Bilateral pleural effusions persist and the right effusion is larger than on the prior study.   Original Report Authenticated By: Holley Dexter, M.D.          Subjective: "There is no sense in living. I can't do anything without a gun right now" Patient states that he is breathing better. Denies any vomiting, abdominal pain, diarrhea, chest pain, dysuria.  Objective: Filed Vitals:   06/13/12 0511 06/13/12 1423 06/13/12 2028 06/14/12 0407  BP: 120/81 118/66 139/71 120/77  Pulse: 63 72 79 77  Temp: 97.1 F (36.2 C) 97.4 F (36.3 C) 97.6 F (36.4 C) 97.3 F (36.3 C)  TempSrc: Oral Oral Oral Oral  Resp: 18 19 18 18   Height:      Weight: 90.2 kg (198 lb 13.7 oz)   89.9 kg (198 lb 3.1 oz)  SpO2: 100% 100% 100% 98%    Intake/Output Summary (  Last 24 hours) at 06/14/12 1116 Last data filed at 06/14/12 0800  Gross per 24 hour  Intake    940 ml  Output   1350 ml  Net   -410 ml   Weight change: -0.3 kg (-10.6 oz) Exam:   General:  Pt is alert, follows commands appropriately, not in acute distress  HEENT: No icterus, No thrush, [place]/AT  Cardiovascular: IRRR, no rubs  Respiratory:  Bibasilar crackles. No wheezes or rhonchi.  Abdomen: Soft/+BS, non tender, non distended, no guarding  Extremities: 2+ edema, No lymphangitis, No petechiae, No rashes, no synovitis  Data Reviewed: Basic Metabolic Panel:  Lab 06/14/12 1610 06/13/12 0530 06/12/12 0500 06/11/12 0936 06/10/12 0500 06/09/12 0425  NA 138 139 140 139 139 --  K 3.8 3.7 3.0* 3.3* 3.2* --  CL 99 100 101 100 101 --  CO2 24 26 25 23 22  --  GLUCOSE 104* 102* 103* 162* 101* --  BUN 69* 75* 83* 89* 101* --  CREATININE 2.06* 2.31* 2.77* 2.96* 3.37* --  CALCIUM 8.6 9.0 8.6 8.3* 8.2* --  MG -- -- 2.4 -- -- --  PHOS -- -- -- -- -- 7.2*   Liver Function Tests:  Lab 06/14/12 0521 06/13/12 0530 06/12/12 1215 06/09/12 0910 06/09/12 0425 06/08/12 0905  AST 73* 101* 175* 1147* -- 1504*  ALT 246* 334* 452* 1153* -- 1312*  ALKPHOS 120* 125* 128* 128* -- 129*  BILITOT 1.5* 1.4* 1.3* 2.0* -- 3.1*  PROT 6.4 6.6 6.4 6.3 -- 6.6  ALBUMIN 2.6* 2.7* 2.8* 3.0* 2.9* --   No results found for this basename: LIPASE:5,AMYLASE:5 in the last 168 hours  Lab 06/07/12 1555  AMMONIA 36   CBC:  Lab 06/09/12 0425 06/08/12 0905 06/07/12 1555  WBC 6.1 6.4 7.5  NEUTROABS -- -- --  HGB 10.4* 10.7* 10.9*  HCT 34.3* 36.0* 36.4*  MCV 84.1 84.3 84.5  PLT 41* 54* 68*   Cardiac Enzymes: No results found for this basename: CKTOTAL:5,CKMB:5,CKMBINDEX:5,TROPONINI:5 in the last 168 hours BNP: No components found with this basename: POCBNP:5 CBG:  Lab 06/07/12 1407  GLUCAP 87    Recent Results (from the past 240 hour(s))  MRSA PCR SCREENING     Status: Normal   Collection Time   06/07/12  2:42 PM      Component Value Range Status Comment   MRSA by PCR NEGATIVE  NEGATIVE Final   URINE CULTURE     Status: Normal   Collection Time   06/08/12 10:32 AM      Component Value Range Status Comment   Specimen Description URINE, CATHETERIZED   Final    Special Requests Normal   Final    Culture  Setup Time 06/08/2012 11:00   Final     Colony Count NO GROWTH   Final    Culture NO GROWTH   Final    Report Status 06/09/2012 FINAL   Final   URINE CULTURE     Status: Normal (Preliminary result)   Collection Time   06/13/12  2:23 PM      Component Value Range Status Comment   Specimen Description URINE, RANDOM   Final    Special Requests NONE   Final    Culture  Setup Time 06/13/2012 18:20   Final    Colony Count >=100,000 COLONIES/ML   Final    Culture GRAM NEGATIVE RODS   Final    Report Status PENDING   Incomplete      Scheduled Meds:   .  docusate sodium  100 mg Oral BID  . feeding supplement  1 Container Oral TID BM  . [COMPLETED] furosemide  40 mg Intravenous BID  . furosemide  80 mg Oral Daily  . gabapentin  200 mg Oral QHS  . magic mouthwash  10 mL Oral TID  . sodium chloride  3 mL Intravenous Q12H   Continuous Infusions:   . sodium chloride 10 mL/hr at 06/09/12 0800     Margaux Engen, DO  [facility] Pager [phone]  If 7PM-7AM, please contact night-coverage [email or web address] Password [email or web address] 06/14/2012, 11:16 AM   LOS: 7 days

## 2012-06-14 NOTE — Progress Notes (Signed)
WHILE BATHING PATIENT THIS MORNING, NURSE TECH NOTIFIED ME THAT PATIENT WAS EXPRESSING THOUGHTS OF SUICIDE. WHEN I ASSESSED THE PATIENT, HE WAS TEARFUL-STATING HE WAS SICK AND TIRED OF FEELING SICK AND TIRED, SAID HE WAS TIRED OF LIVING LIKE THIS, STATED HE WAS GOING TO BLOW HIS HEAD OFF WITH HIS GUN, ETC. [name] AND [name] NOTIFIED OF PATIENT'S CHANGE.  SUICIDE SITTER BEDSIDE, WILL NOTIFY FAMILY OF SITUATION. WILL CONTINUE TO MONITOR. Juan Wolf

## 2012-06-15 ENCOUNTER — Ambulatory Visit: Payer: Medicare Other | Admitting: Physician Assistant

## 2012-06-15 DIAGNOSIS — I4892 Unspecified atrial flutter: Secondary | ICD-10-CM

## 2012-06-15 LAB — COMPREHENSIVE METABOLIC PANEL
ALT: 184 U/L — ABNORMAL HIGH (ref 0–53)
Alkaline Phosphatase: 115 U/L (ref 39–117)
BUN: 64 mg/dL — ABNORMAL HIGH (ref 6–23)
CO2: 29 mEq/L (ref 19–32)
Calcium: 8.7 mg/dL (ref 8.4–10.5)
GFR calc Af Amer: 34 mL/min — ABNORMAL LOW (ref >90)
GFR calc non Af Amer: 30 mL/min — ABNORMAL LOW (ref >90)
Glucose, Bld: 173 mg/dL — ABNORMAL HIGH (ref 70–99)
Total Protein: 6.5 g/dL (ref 6.0–8.3)

## 2012-06-15 LAB — URINE CULTURE

## 2012-06-15 MED ORDER — SERTRALINE HCL 25 MG PO TABS
25.0000 mg | ORAL_TABLET | Freq: Every day | ORAL | Status: DC
  Administered 2012-06-15 – 2012-06-19 (×5): 25 mg via ORAL
  Filled 2012-06-15 (×5): qty 1

## 2012-06-15 NOTE — Evaluation (Signed)
Physical Therapy Evaluation Patient Details Name: Juan Wolf MRN: [id number] DOB: 1921/08/03 Today's Date: 06/15/2012 Time: 4098-1191 PT Time Calculation (min): 22 min  PT Assessment / Plan / Recommendation Clinical Impression  Pt admitted with aortic stenosis, syncope and acute renal failure. Continue to await family decision regarding palliative care and plan of care but pt willing to participate in therapy acutely and states he wants to gain as much strength and mobility as he can acutely.  Pt very pleasant and will benefit from acute therapy to maximize strength, mobility and function to improve quality of life and decrease burden of care at discharge. Should family decide on comfort care please specify role of therapy in their plan. Recommend daily ROM and mobility with nursing as pt able to tolerate. Pt with abrasions noted to anterior right shin and RN notified with SCD left off of RLE.     PT Assessment  Patient needs continued PT services    Follow Up Recommendations  SNF;Supervision/Assistance - 24 hour;Home health PT (SNF if family unable to provide 24hr)    Does the patient have the potential to tolerate intense rehabilitation      Barriers to Discharge Decreased caregiver support (family not present to confirm 24hrs assist)      Equipment Recommendations  Rolling walker with 5" wheels;Hospital bed;Wheelchair (measurements);Wheelchair cushion (measurements);3 in 1 bedside comode    Recommendations for Other Services OT consult   Frequency Min 3X/week    Precautions / Restrictions Precautions Precautions: Fall Restrictions Weight Bearing Restrictions: No   Pertinent Vitals/Pain Pt reported stiffness and soreness with initial bil LE movement but no pain at rest end of session      Mobility  Bed Mobility Bed Mobility: Supine to Sit;Sit to Supine;Sitting - Scoot to Edge of Bed Supine to Sit: 2: Max assist;HOB elevated;With rails (HOB 30degrees) Sitting - Scoot to  Edge of Bed: 3: Mod assist Sit to Supine: 2: Max assist;HOB flat Details for Bed Mobility Assistance: assist to pivot legs to EOB and to elevate trunk from surface with max cueing and assist to elevate legs back onto surface with return to supine. Reciprocal scooting with pad and cueing to get to EOB Transfers Transfers: Not assessed Ambulation/Gait Ambulation/Gait Assistance: Not tested (comment)    Shoulder Instructions     Exercises General Exercises - Lower Extremity Heel Slides: AAROM;AROM;5 reps;Right;Left;Supine (AAROM on RLE, AROM LLE)   PT Diagnosis: Difficulty walking;Generalized weakness  PT Problem List: Decreased strength;Decreased activity tolerance;Decreased balance;Decreased mobility;Decreased knowledge of use of DME;Cardiopulmonary status limiting activity;Decreased cognition PT Treatment Interventions: DME instruction;Functional mobility training;Therapeutic activities;Therapeutic exercise;Balance training;Patient/family education;Cognitive remediation;Neuromuscular re-education   PT Goals Acute Rehab PT Goals PT Goal Formulation: With patient Time For Goal Achievement: 06/29/12 Potential to Achieve Goals: Fair Pt will Roll Supine to Right Side: with mod assist PT Goal: Rolling Supine to Right Side - Progress: Goal set today Pt will Roll Supine to Left Side: with mod assist PT Goal: Rolling Supine to Left Side - Progress: Goal set today Pt will go Supine/Side to Sit: with mod assist PT Goal: Supine/Side to Sit - Progress: Goal set today Pt will Sit at Edge of Bed: with min assist;3-5 min;with bilateral upper extremity support PT Goal: Sit at Edge Of Bed - Progress: Goal set today Pt will go Sit to Supine/Side: with mod assist;with HOB 0 degrees PT Goal: Sit to Supine/Side - Progress: Goal set today Pt will go Sit to Stand: with +2 total assist;Other (comment) (pt=40%) PT Goal: Sit to  Stand - Progress: Goal set today Pt will go Stand to Sit: with +2 total  assist;Other (comment) (pt=40%) PT Goal: Stand to Sit - Progress: Goal set today Pt will Transfer Bed to Chair/Chair to Bed: with +2 total assist;Other (comment) (pt=40%) PT Transfer Goal: Bed to Chair/Chair to Bed - Progress: Goal set today  Visit Information  Last PT Received On: 06/15/12 Assistance Needed: +2    Subjective Data  Subjective: "My son works as a Curator." Patient Stated Goal: be able to move   Prior Functioning  Home Living Lives With: Son Available Help at Discharge: Family;Available 24 hours/day (Son works but states dgtr-in-law can stay 24) Type of Home: House Home Access: Stairs to enter [employer] of Steps: 1 Home Layout: One level Bathroom Shower/Tub: Walk-in shower;Tub/shower unit (pt uses walk-in shower) Bathroom Toilet: Standard (Also has handicapped height but uses standard) Home Adaptive Equipment: Built-in shower seat;Hand-held shower hose Additional Comments: Pt. 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    Cognition  Overall Cognitive Status: Impaired Area of Impairment: Safety/judgement Arousal/Alertness: Awake/alert Orientation Level: Disoriented to;Time Behavior During Session: WFL for tasks performed Safety/Judgement: Decreased awareness of need for assistance    Extremity/Trunk Assessment Right Lower Extremity Assessment RLE ROM/Strength/Tone: Deficits RLE ROM/Strength/Tone Deficits: 2-/5 knee extension/ flexion and hip flexion Left Lower Extremity Assessment LLE ROM/Strength/Tone: Deficits LLE ROM/Strength/Tone Deficits: hip flexion 2+/5, knee extension 2+/5, knee flexion 2/5 Trunk Assessment Trunk Assessment: Kyphotic (pt unable to extend neck and trunk without assist)     Balance Static Sitting Balance Static Sitting - Balance Support: Bilateral upper extremity supported;Feet supported Static Sitting - Level of Assistance: 2: Max assist Static Sitting - Comment/# of Minutes: 6 min EOB pt progressed from max assist for balance due to posterior lean to minguard for the last minute of sitting with cueing for posture and sequence to support trunk. Pt fatigued after 6 min with return to supine  End of Session PT - End of Session Activity Tolerance: Patient limited by fatigue Patient left: in bed;with call bell/phone within reach;Other (comment) Psychiatrist in room) Nurse Communication: Mobility status  GP     Delorse Lek 06/15/2012, 2:29 PM  Delaney Meigs, PT [phone]

## 2012-06-15 NOTE — Progress Notes (Signed)
Patient ID: Juan Wolf, male   DOB: [birth year], 78 y.o.   MRN: [id number]   SUBJECTIVE:  The patient is more alert and reactive today. He did smile briefly.    Filed Vitals:   06/14/12 1424 06/14/12 2200 06/15/12 0300 06/15/12 0931  BP: 142/77 136/65 126/73 141/66  Pulse: 88 59 76 70  Temp: 98 F (36.7 C) 97.6 F (36.4 C) 97.3 F (36.3 C) 97.1 F (36.2 C)  TempSrc: Oral Oral Oral Oral  Resp: 20 18 16 16   Height:      Weight:   214 lb 11.7 oz (97.4 kg)   SpO2: 100% 99% 94% 98%    Intake/Output Summary (Last 24 hours) at 06/15/12 0953 Last data filed at 06/15/12 0934  Gross per 24 hour  Intake   1666 ml  Output    825 ml  Net    841 ml    LABS: Basic Metabolic Panel:  Basename 06/14/12 0521 06/13/12 0530  NA 138 139  K 3.8 3.7  CL 99 100  CO2 24 26  GLUCOSE 104* 102*  BUN 69* 75*  CREATININE 2.06* 2.31*  CALCIUM 8.6 9.0  MG -- --  PHOS -- --   Liver Function Tests:  [facility] 06/14/12 0521 06/13/12 0530  AST 73* 101*  ALT 246* 334*  ALKPHOS 120* 125*  BILITOT 1.5* 1.4*  PROT 6.4 6.6  ALBUMIN 2.6* 2.7*   No results found for this basename: LIPASE:2,AMYLASE:2 in the last 72 hours CBC: No results found for this basename: WBC:2,NEUTROABS:2,HGB:2,HCT:2,MCV:2,PLT:2 in the last 72 hours Cardiac Enzymes: No results found for this basename: CKTOTAL:3,CKMB:3,CKMBINDEX:3,TROPONINI:3 in the last 72 hours BNP: No components found with this basename: POCBNP:3 D-Dimer: No results found for this basename: DDIMER:2 in the last 72 hours Hemoglobin A1C: No results found for this basename: HGBA1C in the last 72 hours Fasting Lipid Panel: No results found for this basename: CHOL,HDL,LDLCALC,TRIG,CHOLHDL,LDLDIRECT in the last 72 hours Thyroid Function Tests: No results found for this basename: TSH,T4TOTAL,FREET3,T3FREE,THYROIDAB in the last 72 hours  RADIOLOGY: US Renal  06/08/2012  *RADIOLOGY REPORT*  Clinical Data: Acute renal failure.  RENAL/URINARY TRACT  ULTRASOUND COMPLETE  Comparison:  Previous abdomen ultrasound.  Findings:  Right Kidney:  Normal, measuring 11.2 cm in length.  No hydronephrosis.  Left Kidney:  Normal, measuring 10.1 cm in length.  No hydronephrosis.  Bladder:  Empty, with a Foley catheter in place.  IMPRESSION: Normal examination.   Original Report Authenticated By: Beckie Salts, M.D.    Dg Chest Port 1 View  06/09/2012  *RADIOLOGY REPORT*  Clinical Data: Cough and congestion.  PORTABLE CHEST - 1 VIEW  Comparison: 06/07/2012.  Findings: The heart remains enlarged.  There is persistent central vascular congestion, mild interstitial pulmonary edema, bilateral pleural effusions and bibasilar atelectasis.  Overall slight improved aeration may suggest resolving edema.  IMPRESSION: Persistent but slightly improved CHF.   Original Report Authenticated By: Rudie Meyer, M.D.    Dg Chest Port 1 View  06/07/2012  *RADIOLOGY REPORT*  Clinical Data: Congestive heart failure.  PORTABLE CHEST - 1 VIEW  Comparison: Chest 06/04/2012.  Findings: Bilateral pleural effusions and basilar airspace disease persist.  The patient's right effusion has worsened.  There is cardiomegaly and interstitial edema.  IMPRESSION: Increased interstitial edema.  Bilateral pleural effusions persist and the right effusion is larger than on the prior study.   Original Report Authenticated By: Holley Dexter, M.D.     PHYSICAL EXAM   Patient is oriented. He is lying flat  in bed. There is no chest pain or shortness of breath. Lungs reveal diffuse rhonchi. Cardiac exam reveals S1 and S2. There is a crescendo decrescendo murmur consistent with his aortic stenosis. The abdomen is soft. There is trace peripheral edema.   TELEMETRY:  I have reviewed telemetry today June 15, 2012. There is atrial flutter with a controlled rate. There is no significant ventricular ectopy.   ASSESSMENT AND PLAN:   CAD (coronary artery disease)   Stable stable   Aortic stenosis     Significant aortic stenosis to be treated medically   Acute on chronic diastolic CHF (congestive heart failure), NYHA class 1    His diuretic was restarted yesterday. Chemistry is not yet available for today. His BUN and creatinine have been improving. The plan is to watch him carefully on his current dose of diuretic. The plan is to eventually to leave him on the wet side. Otherwise he will have a recurrent syncopal episode similar to what brought him into the hospital initially.   Ventricular tachycardia    No ventricular ectopy in the last day   Renal insufficiency    Continue to watch his renal function on his current dose of diuretic.   Major depression     This is being treated with the help of psychiatry    Shock liver    This has improved completely with treatment during this hospitalization   [name] 06/15/2012 9:53 AM

## 2012-06-15 NOTE — Progress Notes (Signed)
Patient Identification:  Juan Wolf Date of Evaluation:  06/15/2012 Reason for Consult:  Follow up Consultation  End stage CHF, SI  Referring Provider:  Dr. Arbutus Leas  History of Present Illness:Pt i seen i afternoon 06/15/12 with sitter and youngest son present, Tim.  Pt gives permission to talk with son.  Pt is in bed and appears asleep   He wakes to respond to questions but also appears very weak.  Sitter remains.    Son recounts numerous problems with CABG x 4, LATER replaced by 3 stents.  He says father pt does ot drink alcohol  He expressed suicidal thought yesterday in distress over his faily health and weakness.    He suffered with intermiittent claudication after the CABG.  He has had atrial fibrillation, poor circulation and fluid retntion.  He had bee driving and using a walker until he got out of breath.  He said it was not worth trying to drive when he could not work anywhere.   He was a hard workder.  He owned and ran a gas station for 40 yrs.  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  His sons learned how to repair cars and were the station mechanics.  He was in [employer]; son does not know how long.  He has gone to the [employer]. He says his father's demeanor has calm and rational.  He had an organized system of taking his medications.  He was short of breath, had loss of strength.  He was orthopneic when sleeping  Tim said his fathe fainted   He had harmed liver and kidneys.   The other sons are aaimmy and [name] the oldest.  [name] has Power of attorney for health decisions.   Past Psychiatric History:tobacco dependence.  He has grieved for his wife who died 45 mos. Ago.  Past Medical History:     Past Medical History  Diagnosis Date  . Umbilical hernia without mention of obstruction or gangrene   . Other and unspecified hyperlipidemia   . COPD (chronic obstructive pulmonary disease)   . Hypertension   . Cramp of limb   . Atrial  flutter     s/p RFCA   . Atrial fibrillation     tikosyn rx d/c in setting of VT (possibly ischemia mediated though)  . CAD (coronary artery disease)     a) Status post balloon angioplasty, 1987, b) 4-vessel CABG, 1997, c) Bare metal stenting, SVG-RCA and SVG-LAD/diagonal graft, 06/2011, d) BMS stent to mid body of SVG to OM2 12/18/2011   . Aortic stenosis 06/05/2011  . Chronic kidney disease     CKD  . Complication of anesthesia     " The doctor told me not to let anyone put me to sleep"  . Myocardial infarction   . GERD (gastroesophageal reflux disease)   . Arthritis   . Chronic diastolic heart failure   . Ventricular tachycardia     12/2011  . Pulmonary HTN        Past Surgical History  Procedure Date  . Appendectomy   . Back surgery   . Cholecystectomy   . Hernia repair   . Cardiac catheterization 06/19/2011    Moderate pulmonary hypertension, moderate AS (valve area: 1.05), severe 3 vessel CAD. 90% stensis in both SVG to LAD/diagonal and SVG to RCA. Patent SVG to OM.   Marland Kitchen Coronary angioplasty 06/19/2011    BMS to both SVG to RCA and SVG  to LAD/diagonal  . Coronary artery bypass graft     Allergies:  Allergies  Allergen Reactions  . Contrast Media (Iodinated Diagnostic Agents)     Using during MRI    Current Medications:  Prior to Admission medications   Medication Sig Start Date End Date Taking? Authorizing Provider  fluticasone (FLONASE) 50 MCG/ACT nasal spray Place 2 sprays into the nose daily as needed. For allergies   Yes Historical Provider, MD  furosemide (LASIX) 80 MG tablet Take 80 mg by mouth 2 (two) times daily.   Yes Historical Provider, MD  gabapentin (NEURONTIN) 100 MG capsule Take 200 mg by mouth at bedtime.   Yes Historical Provider, MD  hydrochlorothiazide (MICROZIDE) 12.5 MG capsule Take 1 capsule (12.5 mg total) by mouth daily. 06/04/12  Yes Prescott Parma, PA  metoprolol (LOPRESSOR) 50 MG tablet Take 1 tablet (50 mg total) by mouth 2 (two) times daily.  06/04/12  Yes Prescott Parma, PA  nitroGLYCERIN (NITROSTAT) 0.4 MG SL tablet Place 1 tablet (0.4 mg total) under the tongue every 5 (five) minutes as needed. For chest pain. CALL YOUR DOCTOR BEFORE USING. 12/21/11  Yes Dayna N Dunn, PA  potassium chloride SA (K-DUR,KLOR-CON) 20 MEQ tablet Take 20 mEq by mouth 2 (two) times daily. 12/21/11  Yes Dayna N Dunn, PA  zolpidem (AMBIEN) 10 MG tablet Take 5-10 mg by mouth at bedtime as needed. sleep   Yes Historical Provider, MD    Social History:    reports that he quit smoking about 16 years ago. His smoking use included Cigars. He quit smokeless tobacco use about 67 years ago. His smokeless tobacco use included Chew. He reports that he does not drink alcohol or use illicit drugs.   Family History:    History reviewed. No pertinent family history.  Mental Status Examination/Evaluation: Objective:  Appearance: Casual and propped with pillows appears weak  Eye Contact::  Minimal  Speech:  Slow and very soft  Volume:  Decreased  Mood:  Depressed  Suicidal related to physical disabilities  Affect:  Depressed  Thought Process:  Coherent, Logical and tired of chronic heart problems  Orientation:  Other:  oriented to persons situation, place and sons  Thought Content:  discouagement  Suicidal Thoughts:  Yes.  without intent/plan  Homicidal Thoughts:  No  Judgement:  Fair  Insight:  Fair   DIAGNOSIS:   AXIS I   Major depressive Episode related to Cardiac medical problems, tobacco dependence  AXIS II  Deferred  AXIS III See medical notes.  AXIS IV other psychosocial or environmental problems, problems related to social environment and complex cardiac problems limiting function and provoking suicidal idation  AXIS V 41-50 serious symptoms   Assessment/Plan:  Discussed with Psych CSW, son, Dimas Aguas provided by son who relates all three sons are concerned and involved with father's[pt's] care.  He is asking about options and is told MD will discuss  plans, especially with Bill who has POA.  At this point they all see their father's decline, discouragement and evolution of suicidal thoughts after so many serial medical problems.  He  Expresses SI in the context of failing health.  He is apparently too weak to act on his thought - sitter is appropriate.  RECOMMENDATION:  1.  Pt eval is defered until able to talk 2.  Historical report of suicidal thoughts - suggest keep sitter. 3.  Will follow pt.  Mickeal Skinner MD 06/15/2012 1:13 PM

## 2012-06-15 NOTE — Progress Notes (Signed)
Clinical Social Work Department CLINICAL SOCIAL WORK PSYCHIATRY SERVICE LINE ASSESSMENT 06/15/2012  Patient:  Juan Wolf  Account:  [id number]  Admit Date:  06/07/2012  Clinical Social Worker:  Unk Lightning, LCSW  Date/Time:  06/15/2012 03:00 PM Referred by:  Physician  Date referred:  06/15/2012 Reason for Referral  Behavioral Health Issues   Presenting Symptoms/Problems (In the person's/family's own words):   Patient stated to RN that he wanted to kill himself on 06/14/12.   Abuse/Neglect/Trauma History (check all that apply)  Denies history   Abuse/Neglect/Trauma Comments:   Patient did was not able to respond to this question but son reported no history that he knew of   Psychiatric History (check all that apply)  Denies history   Psychiatric medications:  Zoloft was started this admission   Current Mental Health Hospitalizations/Previous Mental Health History:   None reported   Current provider:   N/A   Place and Date:   N/A   Current Medications:   acetaminophen, acetaminophen, albuterol, fluticasone, nitroGLYCERIN, ondansetron (ZOFRAN) IV, ondansetron, oxyCODONE, zolpidem                        . docusate sodium  100 mg Oral BID  . feeding supplement  1 Container Oral TID BM  . furosemide  80 mg Oral Daily  . gabapentin  200 mg Oral QHS  . magic mouthwash  10 mL Oral TID  . sertraline  25 mg Oral Daily  . sodium chloride  3 mL Intravenous Q12H   Previous Impatient Admission/Date/Reason:   None reported   Emotional Health / Current Symptoms    Suicide/Self Harm  Suicidal ideation (ex: "I can't take any more,I wish I could disappear")   Suicide attempt in the past:   On 06/14/12 it is noted in the chart that patient was "EXPRESSING THOUGHTS OF SUICIDE. WHEN I ASSESSED THE PATIENT, HE WAS TEARFUL-STATING HE WAS SICK AND TIRED OF FEELING SICK AND TIRED, SAID HE WAS TIRED OF LIVING LIKE THIS, STATED HE WAS GOING TO BLOW HIS HEAD OFF WITH HIS GUN, ETC"    Other harmful behavior:   None reported   Psychotic/Dissociative Symptoms  None reported   Other Psychotic/Dissociative Symptoms:    Attention/Behavioral Symptoms  Unable to accurately assess   Other Attention / Behavioral Symptoms:    Cognitive Impairment  Unable to accurately assess   Other Cognitive Impairment:    Mood and Adjustment  Lethargic    Stress, Anxiety, Trauma, Any Recent Loss/Stressor  Other - See comment   Anxiety (frequency):   Phobia (specify):   Compulsive behavior (specify):   Obsessive behavior (specify):   Other:   Wife passed away about 15 months ago. Patient has reported to son that he has lost all independence since being hospitalized. Patient has a son that possibly has cancer.   Substance Abuse/Use  None   SBIRT completed (please refer for detailed history):  N  Self-reported substance use:   Son reports that patient does not use any substances   Urinary Drug Screen Completed:  N Alcohol level:    Environmental/Housing/Living Arrangement  Stable housing   Who is in the home:   Patient lives home alone. 3 sons live nearby and check on him often throughout the day.   Emergency contact:  Verlin Dike   Financial  Medicare   Patient's Strengths and Goals (patient's own words):   Patient has 3 supportive sons. Patient was independent PTA. Patient has been compliant  with medications.   Clinical Social Worker's Interpretive Summary:   CSW received referral due to patient having SI. CSW reviewed chart and spoke with bedside RN. RN reports that patient has been drowsy and has only communicated with her when she wakes him up. RN reports no suicide remarks made today.    CSW met with patient and son at bedside. CSW introduced myself and explained role. Patient was drowsy but answered some questions before going to sleep. Patient agreeable to son involvement during assessment. Patient reports that he has three sons who assist him with  meals and cleaning the house. Patient uses a walker to ambulate at home and a wheelchair in public. Patient's sons transport him to dr. appointments but reports that they all still work during the day. Patient reports that he remembers stating that he was going to hurt himself yesterday but was not feeling suicidal today. Patient was unable to expand on any further details and fell asleep. CSW gathered information from son at this point. Son verified that information that patient stated was all true. Son reports that he was surprised to hear that patient made comments of hurting himself. Son reports patient has never been diagnosed with any mental health concerns. Patient has been depressed since wife passed away but has not received any treatment and only started taking medication in the hospital. Son feels that patient is depressed that he has lost his independence and is unable to return home. Son does not feel family can support patient at home and family is still discussing disposition. This son [name]) is not HCPOA but has concerns about patient going to hospice home. Patient's wife to hospice home and survived less than 1 hour before passing away. Son reports that family will discuss final plans today.    Patient was unable to participate throughout the entire assessment. Son reports limited concern for suicidal remarks made yesterday. Son feels that patient is struggling with communicating his feelings and worried about passing away. Son reports no previous attempts and no previous diagnosis. CSW feels patient does need supervision at dc and could benefit from counseling. If patient goes to hospice facility then hospice SW could assist with depression or this writer feels patient could benefit from psych to continue to follow if he goes to SNF.    CSW will continue to follow and will staff case with psych MD regarding disposition.   Disposition:  Recommend Psych CSW continuing to support while in  hospital

## 2012-06-15 NOTE — Progress Notes (Signed)
Nutrition Follow-up  Intervention:   1. Continue current supplements.  2. RD will continue to follow    Assessment:   Being followed by Palliative for comfort care possible transition to residential hospice.  Diuretics have been restarted after being held for elevated BUN/Cr.  Pt was resting at time of RD visit, no family in room. Lunch was at bedside, untouched. Resource Breeze also unopened. PO intake has been variable.    Diet Order:  Low Sodium Supplements: Resource Breeze po TID, each supplement provides 250 kcal and 9 grams of protein. PO intake: Variable  Meds: Scheduled Meds:   . docusate sodium  100 mg Oral BID  . feeding supplement  1 Container Oral TID BM  . furosemide  80 mg Oral Daily  . gabapentin  200 mg Oral QHS  . magic mouthwash  10 mL Oral TID  . sertraline  25 mg Oral Daily  . sodium chloride  3 mL Intravenous Q12H   Continuous Infusions:   . sodium chloride 10 mL/hr at 06/09/12 0800   PRN Meds:.acetaminophen, acetaminophen, albuterol, fluticasone, nitroGLYCERIN, ondansetron (ZOFRAN) IV, ondansetron, oxyCODONE, zolpidem   CMP     Component Value Date/Time   NA 139 06/15/2012 0912   K 3.0* 06/15/2012 0912   CL 97 06/15/2012 0912   CO2 29 06/15/2012 0912   GLUCOSE 173* 06/15/2012 0912   BUN 64* 06/15/2012 0912   CREATININE 1.92* 06/15/2012 0912   CALCIUM 8.7 06/15/2012 0912   PROT 6.5 06/15/2012 0912   ALBUMIN 2.6* 06/15/2012 0912   AST 49* 06/15/2012 0912   ALT 184* 06/15/2012 0912   ALKPHOS 115 06/15/2012 0912   BILITOT 1.4* 06/15/2012 0912   GFRNONAA 30* 06/15/2012 0912   GFRAA 34* 06/15/2012 0912    CBG (last 3)  No results found for this basename: GLUCAP:3 in the last 72 hours   Intake/Output Summary (Last 24 hours) at 06/15/12 1206 Last data filed at 06/15/12 0934  Gross per 24 hour  Intake   1666 ml  Output    825 ml  Net    841 ml    Weight Status:  214 lbs, up from 198 lbs yesterday. ? Accuracy of today's weight.  Admission weight: 201  lbs.   Re-estimated needs:  1750-1900 kcal, 90-110 gm protein, 1.8-2 L   Nutrition Dx:  Inadequate oral intake related to poor appetite with acute illness as evidenced by 5% weight loss and minimal intake of meals.  -ongoing    Goal:  Intake to meet >/=90% estimated nutrition needs.  --Not consistently met at this time.   Monitor:  PO intake, weight, labs   Clarene Duke RD, LDN Pager [phone] After Hours pager [phone]

## 2012-06-15 NOTE — Progress Notes (Signed)
Patient Juan Wolf      DOB: [birth year]      WUJ:[id number]   Palliative Medicine Team at [facility] Progress Note    Subjective:  Patient with slightly better outlook today.  Open to working with physical therapy.  Spoke with patient's son.  In light of the fact that he is holding his own,  Would like to give him the opportunity to do rehab.  Plan to maximize medical therapy and then obtain SNF placement.  Hospice could become involved later on in patient's health care as symptoms warrant.  Son happy with this suggestion at this time.  Is aware that he might have relapse and might achieve independence again but wants to give it a shot.     Filed Vitals:   06/15/12 0931  BP: 141/66  Pulse: 70  Temp: 97.1 F (36.2 C)  Resp: 16   Physical exam: Generally: brighter, states slept some last pm. PERRL, EOMI, left eye less puffy,  Mm dry Chest: decreased with some crackles at the bases no wheeze, still with intermittent cough CVS: irregular, S1, S2, ABd: obese, soft , nt, nd, postive bowel sounds Ext: no edema,  2 plus pulses Neuro:  Oriented to self and general place, time has been hard for him this visit  Lab Results  Component Value Date   CREATININE 1.92* 06/15/2012   BUN 64* 06/15/2012   NA 139 06/15/2012   K 3.0* 06/15/2012   CL 97 06/15/2012   CO2 29 06/15/2012        Assessment and plan:  78 year old white male with advanced aortic stenosis s/p hypotension with syncope which result in shock liver and acute on chronic renal failure.  Patient has slowly stabilized and his renal and liver function have improved.  His hospital course was complicated by depression with threat for suicide.  He is being started on zoloft and we continue to work with him on behavioral modes for dealing with his stressors.  His son would like to pursue SNF placement to see if he will rehab at all  .  Hospice would be an excellent adjunct if he is unable to rehab.  1.  DNR/ DNI  2.  Back pain  continue opiates  3.  Depression: started on zoloft, psych helping with this.  4.  Generalized weakness and FTT:  Have PT continue to slowly work towards level of independence and dignity.  Total time with patient 20 min and spoke with Son for 15.  Reviewed with Dr. Arbutus Leas and will communicate with SW.   Juan Waibel L. Ladona Ridgel, MD MBA The Palliative Medicine Team at [facility] Phone: [phone] Pager: [phone]

## 2012-06-15 NOTE — Progress Notes (Signed)
Palliative Medicine Team SW Pt discussed in PMT Rounds. Reviewed chart and attempted to visit with pt earlier this morning, but pt unable to fully engage due to drowsiness, which sitter endorses had been ongoing today. Also note visits from Psych and Psych CSW, will follow up for grief and family support as needed.   Kennieth Francois, [place] Pager [phone]

## 2012-06-15 NOTE — Progress Notes (Signed)
[facility] PROGRESS NOTE  Juan Wolf ION:[id number] DOB: 12/14/1921 DOA: 06/07/2012 PCP: Ignatius Specking., MD  Assessment/Plan: Acute on Chronic diastolic CHF  -This morning weight appears incorrect--??gained 7.5 kg since yesterday -renal function improving-await morning CMP - po furosemide per cardiology--appreciate followup -EF 50%  Depression/Suicidal ideation  -Start sertraline 25 mg daily per psychiatry recommendation -Consider discontinuing Ambien as this may contribute to depression/emotional lability  -No suicidal ideation this morning--"I was in the rotten mood yesterday" Acute on chronic renal failure  -continues to improve Baseline crt ~1.2-1.3 - likely due to hypoperfusion  Acute hepatitis / transaminitis / coagulopathy  - passive hepatic congestion due to severe R heart failure  -exacerbation related to hypoperfusion due to volume depletion  -improving  Pulm HTN / R Heart failure/ Cor Pulmonale  RV severely dilated and hypokinetic via echo 11/22  11/22 TTE >> mod LVH, LVEF 50%, RV dilation, RA E, LAE, mod-severe AS, mild AI, IVC dilation, mod to severe RV dysfunction  -leave on "wet" side  Suprapubic discomfort  -improved  -repeat UA does not suggest UTI  Lethargy/altered mental status  More lucid     Disposition Plan:   SNF vs Residential hospice-- Dr. Ladona Ridgel following           Procedures/Studies: US Renal  06/08/2012  *RADIOLOGY REPORT*  Clinical Data: Acute renal failure.  RENAL/URINARY TRACT ULTRASOUND COMPLETE  Comparison:  Previous abdomen ultrasound.  Findings:  Right Kidney:  Normal, measuring 11.2 cm in length.  No hydronephrosis.  Left Kidney:  Normal, measuring 10.1 cm in length.  No hydronephrosis.  Bladder:  Empty, with a Foley catheter in place.  IMPRESSION: Normal examination.   Original Report Authenticated By: Beckie Salts, M.D.    Dg Chest Port 1 View  06/09/2012  *RADIOLOGY REPORT*  Clinical Data: Cough and congestion.   PORTABLE CHEST - 1 VIEW  Comparison: 06/07/2012.  Findings: The heart remains enlarged.  There is persistent central vascular congestion, mild interstitial pulmonary edema, bilateral pleural effusions and bibasilar atelectasis.  Overall slight improved aeration may suggest resolving edema.  IMPRESSION: Persistent but slightly improved CHF.   Original Report Authenticated By: Rudie Meyer, M.D.    Dg Chest Port 1 View  06/07/2012  *RADIOLOGY REPORT*  Clinical Data: Congestive heart failure.  PORTABLE CHEST - 1 VIEW  Comparison: Chest 06/04/2012.  Findings: Bilateral pleural effusions and basilar airspace disease persist.  The patient's right effusion has worsened.  There is cardiomegaly and interstitial edema.  IMPRESSION: Increased interstitial edema.  Bilateral pleural effusions persist and the right effusion is larger than on the prior study.   Original Report Authenticated By: Holley Dexter, M.D.          Subjective: "I was in a rotten mood yesterday." Denies suicidal ideation this morning. Denies fevers, chills, chest pain, shortness breath, vomiting, diarrhea, abdominal pain. Complains of "tailbone pain".  Objective: Filed Vitals:   06/14/12 0407 06/14/12 1424 06/14/12 2200 06/15/12 0300  BP: 120/77 142/77 136/65 126/73  Pulse: 77 88 59 76  Temp: 97.3 F (36.3 C) 98 F (36.7 C) 97.6 F (36.4 C) 97.3 F (36.3 C)  TempSrc: Oral Oral Oral Oral  Resp: 18 20 18 16   Height:      Weight: 89.9 kg (198 lb 3.1 oz)   97.4 kg (214 lb 11.7 oz)  SpO2: 98% 100% 99% 94%    Intake/Output Summary (Last 24 hours) at 06/15/12 0831 Last data filed at 06/15/12 0744  Gross per 24 hour  Intake  1663 ml  Output    825 ml  Net    838 ml   Weight change: 7.5 kg (16 lb 8.6 oz) Exam:   General:  Pt is alert, follows commands appropriately, not in acute distress  HEENT: No icterus, No thrush,[place]/AT  Cardiovascular: IRRR,  no rubs, no gallops  Respiratory: Bibasilar crackles. No wheezes or  rhonchi. Good air movement.  Abdomen: Soft/+BS, non tender, non distended, no guarding  Extremities: 2+ edema, No lymphangitis, No petechiae, No rashes, no synovitis  Data Reviewed: Basic Metabolic Panel:  Lab 06/14/12 0981 06/13/12 0530 06/12/12 0500 06/11/12 0936 06/10/12 0500 06/09/12 0425  NA 138 139 140 139 139 --  K 3.8 3.7 3.0* 3.3* 3.2* --  CL 99 100 101 100 101 --  CO2 24 26 25 23 22  --  GLUCOSE 104* 102* 103* 162* 101* --  BUN 69* 75* 83* 89* 101* --  CREATININE 2.06* 2.31* 2.77* 2.96* 3.37* --  CALCIUM 8.6 9.0 8.6 8.3* 8.2* --  MG -- -- 2.4 -- -- --  PHOS -- -- -- -- -- 7.2*   Liver Function Tests:  Lab 06/14/12 0521 06/13/12 0530 06/12/12 1215 06/09/12 0910 06/09/12 0425 06/08/12 0905  AST 73* 101* 175* 1147* -- 1504*  ALT 246* 334* 452* 1153* -- 1312*  ALKPHOS 120* 125* 128* 128* -- 129*  BILITOT 1.5* 1.4* 1.3* 2.0* -- 3.1*  PROT 6.4 6.6 6.4 6.3 -- 6.6  ALBUMIN 2.6* 2.7* 2.8* 3.0* 2.9* --   No results found for this basename: LIPASE:5,AMYLASE:5 in the last 168 hours No results found for this basename: AMMONIA:5 in the last 168 hours CBC:  Lab 06/09/12 0425 06/08/12 0905  WBC 6.1 6.4  NEUTROABS -- --  HGB 10.4* 10.7*  HCT 34.3* 36.0*  MCV 84.1 84.3  PLT 41* 54*   Cardiac Enzymes: No results found for this basename: CKTOTAL:5,CKMB:5,CKMBINDEX:5,TROPONINI:5 in the last 168 hours BNP: No components found with this basename: POCBNP:5 CBG: No results found for this basename: GLUCAP:5 in the last 168 hours  Recent Results (from the past 240 hour(s))  MRSA PCR SCREENING     Status: Normal   Collection Time   06/07/12  2:42 PM      Component Value Range Status Comment   MRSA by PCR NEGATIVE  NEGATIVE Final   URINE CULTURE     Status: Normal   Collection Time   06/08/12 10:32 AM      Component Value Range Status Comment   Specimen Description URINE, CATHETERIZED   Final    Special Requests Normal   Final    Culture  Setup Time 06/08/2012 11:00   Final     Colony Count NO GROWTH   Final    Culture NO GROWTH   Final    Report Status 06/09/2012 FINAL   Final   URINE CULTURE     Status: Normal   Collection Time   06/13/12  2:23 PM      Component Value Range Status Comment   Specimen Description URINE, RANDOM   Final    Special Requests NONE   Final    Culture  Setup Time 06/13/2012 18:20   Final    Colony Count >=100,000 COLONIES/ML   Final    Culture CITROBACTER KOSERI   Final    Report Status 06/15/2012 FINAL   Final    Organism ID, Bacteria CITROBACTER KOSERI   Final      Scheduled Meds:   . docusate sodium  100 mg  Oral BID  . feeding supplement  1 Container Oral TID BM  . furosemide  80 mg Oral Daily  . gabapentin  200 mg Oral QHS  . magic mouthwash  10 mL Oral TID  . sertraline  25 mg Oral Daily  . sodium chloride  3 mL Intravenous Q12H   Continuous Infusions:   . sodium chloride 10 mL/hr at 06/09/12 0800     Jashon Ishida, DO  [facility] Pager [phone]  If 7PM-7AM, please contact night-coverage [email or web address] Password [email or web address] 06/15/2012, 8:31 AM   LOS: 8 days

## 2012-06-16 DIAGNOSIS — E876 Hypokalemia: Secondary | ICD-10-CM

## 2012-06-16 DIAGNOSIS — F329 Major depressive disorder, single episode, unspecified: Secondary | ICD-10-CM

## 2012-06-16 DIAGNOSIS — F17200 Nicotine dependence, unspecified, uncomplicated: Secondary | ICD-10-CM

## 2012-06-16 LAB — COMPREHENSIVE METABOLIC PANEL
ALT: 145 U/L — ABNORMAL HIGH (ref 0–53)
AST: 37 U/L (ref 0–37)
Albumin: 2.5 g/dL — ABNORMAL LOW (ref 3.5–5.2)
CO2: 29 mEq/L (ref 19–32)
Calcium: 9 mg/dL (ref 8.4–10.5)
Chloride: 97 mEq/L (ref 96–112)
Creatinine, Ser: 1.76 mg/dL — ABNORMAL HIGH (ref 0.50–1.35)
GFR calc non Af Amer: 33 mL/min — ABNORMAL LOW (ref >90)
Sodium: 138 mEq/L (ref 135–145)

## 2012-06-16 MED ORDER — ONDANSETRON HCL 4 MG/2ML IJ SOLN: 4.0000 mg | Freq: Four times a day (QID) | INTRAMUSCULAR | Status: DC | PRN

## 2012-06-16 MED ORDER — ONDANSETRON HCL 4 MG PO TABS: 4.0000 mg | ORAL_TABLET | ORAL | Status: DC | PRN

## 2012-06-16 MED ORDER — ONDANSETRON HCL 4 MG/2ML IJ SOLN
4.0000 mg | INTRAMUSCULAR | Status: DC | PRN
  Administered 2012-06-18 – 2012-06-19 (×3): 4 mg via INTRAVENOUS
  Filled 2012-06-16 (×3): qty 2

## 2012-06-16 MED ORDER — POTASSIUM CHLORIDE CRYS ER 20 MEQ PO TBCR
40.0000 meq | EXTENDED_RELEASE_TABLET | Freq: Every day | ORAL | Status: DC
  Administered 2012-06-16 – 2012-06-17 (×2): 40 meq via ORAL
  Filled 2012-06-16 (×2): qty 2

## 2012-06-16 MED ORDER — ONDANSETRON HCL 4 MG PO TABS
4.0000 mg | ORAL_TABLET | ORAL | Status: DC | PRN
  Administered 2012-06-16 – 2012-06-17 (×3): 4 mg via ORAL
  Filled 2012-06-16 (×3): qty 1

## 2012-06-16 MED ORDER — POTASSIUM CHLORIDE CRYS ER 20 MEQ PO TBCR: 40.0000 meq | EXTENDED_RELEASE_TABLET | Freq: Once | ORAL | Status: DC

## 2012-06-16 MED ORDER — POTASSIUM CHLORIDE 10 MEQ/100ML IV SOLN
10.0000 meq | INTRAVENOUS | Status: AC
  Administered 2012-06-16 (×2): 10 meq via INTRAVENOUS
  Filled 2012-06-16 (×2): qty 100

## 2012-06-16 NOTE — Progress Notes (Signed)
Physical Therapy Treatment Patient Details Name: Juan Wolf MRN: [id number] DOB: [birth year] Today's Date: 06/16/2012 Time: 4098-1191 PT Time Calculation (min): 18 min  PT Assessment / Plan / Recommendation Comments on Treatment Session  Pt with aortic stenosis, CHF and shock liver with decreased participation and activity tolerance today. Pt denied attempting any HEP and after only briefly sitting EOB requested return to supine due to dizziness and nausea. Pt rolled and assisted with pericare as linens around pt groin were wet anteriorly and posteriorly and RN aware. Pt encouraged to continue attempting to move bil UE and bil LE throughout the day for strengthening. Will continue to follow.                                       Plan Discharge plan remains appropriate;Frequency remains appropriate    Precautions / Restrictions Precautions Precautions: Fall Precaution Comments: oxygen   Pertinent Vitals/Pain Pt reports nausea, stomach pain and LLE pain with movement, all unrated Pt repositioned 3L O2 throughout    Mobility  Bed Mobility Bed Mobility: Rolling Right;Rolling Left;Left Sidelying to Sit;Sit to Sidelying Left;Scooting to [facility] Rolling Right: 2: Max assist Rolling Left: 2: Max assist Left Sidelying to Sit: HOB elevated;2: Max assist (HOB 20degrees) Sit to Supine: 2: Max assist;HOB elevated (HOB 15degrees) Scooting to HOB: 1: +2 Total assist Scooting to [facility]: Patient Percentage: 0% Details for Bed Mobility Assistance: Pt with max cueing and hand over hand assist to reach for rails to assist with rolling as well as assist to bend knees. Pt with total assist to bring legs off EOB and back onto bed after sitting and max assist to elevate trunk from surface. Pt sat EOB grossly 30sec then required return to supine secondary to dizziness, stomach pain and fatigue. Bed in trendelenburg to scoot to [facility] Transfers Transfers: Not assessed Ambulation/Gait Ambulation/Gait  Assistance: Not tested (comment)    Exercises        PT Goals Acute Rehab PT Goals PT Goal: Rolling Supine to Right Side - Progress: Not progressing PT Goal: Rolling Supine to Left Side - Progress: Not progressing PT Goal: Supine/Side to Sit - Progress: Not progressing PT Goal: Sit at [facility] Of Bed - Progress: Not progressing  Visit Information  Last PT Received On: 06/16/12 Assistance Needed: +2    Subjective Data  Subjective: I just feel terrible with my stomach today   Cognition  Overall Cognitive Status: Impaired Arousal/Alertness: Awake/alert Orientation Level: Disoriented to;Time Behavior During Session: Flat affect Safety/Judgement: Decreased awareness of need for assistance    Balance     End of Session PT - End of Session Activity Tolerance: Patient limited by fatigue;Patient limited by pain Patient left: in bed;with call bell/phone within reach;Other (comment) (with sitter present)        Delorse Lek 06/16/2012, 3:02 PM Delaney Meigs, PT [phone]

## 2012-06-16 NOTE — Progress Notes (Signed)
Patient ID: Juan Wolf, male   DOB: 08/27/1921, 78 y.o.   MRN: [id number]   SUBJECTIVE:  Patient is alert this morning. He is weak. He did respond to my questions.   Filed Vitals:   06/15/12 0931 06/15/12 1658 06/15/12 2110 06/16/12 0505  BP: 141/66 136/66 123/66 147/79  Pulse: 70 73 70 68  Temp: 97.1 F (36.2 C) 98.7 F (37.1 C) 98.4 F (36.9 C) 97.8 F (36.6 C)  TempSrc: Oral Oral Oral Oral  Resp: 16 16 16 16   Height:      Weight:    212 lb 4.9 oz (96.3 kg)  SpO2: 98% 100% 100% 99%    Intake/Output Summary (Last 24 hours) at 06/16/12 0804 Last data filed at 06/16/12 0500  Gross per 24 hour  Intake    701 ml  Output   1200 ml  Net   -499 ml    LABS: Basic Metabolic Panel:  Basename 06/16/12 0608 06/15/12 0912  NA 138 139  K 2.9* 3.0*  CL 97 97  CO2 29 29  GLUCOSE 119* 173*  BUN 61* 64*  CREATININE 1.76* 1.92*  CALCIUM 9.0 8.7  MG -- --  PHOS -- --   Liver Function Tests:  [facility] 06/16/12 0608 06/15/12 0912  AST 37 49*  ALT 145* 184*  ALKPHOS 118* 115  BILITOT 1.4* 1.4*  PROT 6.5 6.5  ALBUMIN 2.5* 2.6*   No results found for this basename: LIPASE:2,AMYLASE:2 in the last 72 hours CBC: No results found for this basename: WBC:2,NEUTROABS:2,HGB:2,HCT:2,MCV:2,PLT:2 in the last 72 hours Cardiac Enzymes: No results found for this basename: CKTOTAL:3,CKMB:3,CKMBINDEX:3,TROPONINI:3 in the last 72 hours BNP: No components found with this basename: POCBNP:3 D-Dimer: No results found for this basename: DDIMER:2 in the last 72 hours Hemoglobin A1C: No results found for this basename: HGBA1C in the last 72 hours Fasting Lipid Panel: No results found for this basename: CHOL,HDL,LDLCALC,TRIG,CHOLHDL,LDLDIRECT in the last 72 hours Thyroid Function Tests: No results found for this basename: TSH,T4TOTAL,FREET3,T3FREE,THYROIDAB in the last 72 hours  RADIOLOGY: US Renal  06/08/2012  *RADIOLOGY REPORT*  Clinical Data: Acute renal failure.  RENAL/URINARY TRACT  ULTRASOUND COMPLETE  Comparison:  Previous abdomen ultrasound.  Findings:  Right Kidney:  Normal, measuring 11.2 cm in length.  No hydronephrosis.  Left Kidney:  Normal, measuring 10.1 cm in length.  No hydronephrosis.  Bladder:  Empty, with a Foley catheter in place.  IMPRESSION: Normal examination.   Original Report Authenticated By: Beckie Salts, M.D.    Dg Chest Port 1 View  06/09/2012  *RADIOLOGY REPORT*  Clinical Data: Cough and congestion.  PORTABLE CHEST - 1 VIEW  Comparison: 06/07/2012.  Findings: The heart remains enlarged.  There is persistent central vascular congestion, mild interstitial pulmonary edema, bilateral pleural effusions and bibasilar atelectasis.  Overall slight improved aeration may suggest resolving edema.  IMPRESSION: Persistent but slightly improved CHF.   Original Report Authenticated By: Rudie Meyer, M.D.    Dg Chest Port 1 View  06/07/2012  *RADIOLOGY REPORT*  Clinical Data: Congestive heart failure.  PORTABLE CHEST - 1 VIEW  Comparison: Chest 06/04/2012.  Findings: Bilateral pleural effusions and basilar airspace disease persist.  The patient's right effusion has worsened.  There is cardiomegaly and interstitial edema.  IMPRESSION: Increased interstitial edema.  Bilateral pleural effusions persist and the right effusion is larger than on the prior study.   Original Report Authenticated By: Holley Dexter, M.D.     PHYSICAL EXAM   The patient is weak. Lung exam reveals  scattered rales. Cardiac exam reveals S1 and S2 with a murmur of aortic stenosis. There is trace edema.   TELEMETRY:  I have reviewed telemetry today June 16, 2012. There is atrial fibrillation with a controlled rate. There are scattered PVCs.   ASSESSMENT AND PLAN:   CAD (coronary artery disease)    Coronary disease is stable.   Aortic stenosis     Moderately severe aortic stenosis is to be followed medically.   Acute on chronic diastolic CHF (congestive heart failure), NYHA class 1      The patient is still volume overloaded. The plan will be to continue to diurese him gingerly. Ultimately the plan is to leave him somewhat on the wet side to be sure that he does not have recurrent syncopal episodes. I believe it is safe to continue his current oral diuretic dose.   Ventricular tachycardia     He has not had any significant recurrent ventricular ectopy.   Renal insufficiency    Creatinine continues to improve slowly on the current plan.    Atrial fibrillation     Atrial fibrillation rate is controlled   Syncope    His syncope occurred leading to admission. This was multifactorial related to his aortic stenosis and other factors. No further workup at this time.    Major depression   This is being done with with psychiatry and other providers. Also palliative care is involved in their complete notes in the chart.  Hypokalemia    Potassium is down to 2.9. I have ordered potassium.  The plan from the cardiac viewpoint he is to continue to watch his volume status and renal function.   [name] 06/16/2012 8:04 AM

## 2012-06-16 NOTE — Progress Notes (Signed)
Clinical Social Work Progress Note PSYCHIATRY SERVICE LINE 06/16/2012  Patient:  Juan Wolf  Account:  [id number]  Admit Date:  06/07/2012  Clinical Social Worker:  Unk Lightning, LCSW  Date/Time:  06/16/2012 03:30 PM  Review of Patient  Overall Medical Condition:   "I feel terrible."    Patient reports feeling nauseous and weak. Patient reports he is tired of working with PT and wants to rest.   Participation Level:  Minimal  Participation Quality  Other - See comment   Other Participation Quality:   Patient with minimally interaction due to feeling sick.   Affect  Depressed   Cognitive  Appropriate   Reaction to Medications/Concerns:   Patient reports that medication is not helping right now.   Modes of Intervention  Support   Summary of Progress/Plan at Discharge   CSW met with patient at bedside to follow up from previous visit. No family present. Sitter at bedside. Sitter reports patient has been appropriate today but has been feeling nauseous. Patient has not eating lunch and refused to work with PT.    CSW sat and spoke with patient. Patient reports that he is tired of feeling sick. Patient is aware that sons are looking at [facility] for him. Patient is concerned about PT at SNF and is worried that they will push him to work with PT even if he is feeling sick. CSW inquired if patient was having any SI. Patient reports no SI and reports that "even if I did, I can't do anything about it." CSW asked patient to expand on this statement. Patient continues to speak about feeling weak and that he just wants to relax. CSW encouraged patient to share this information with sons and concerns regarding SNF. Patient reports he is feeling hopeless that he will ever get better. Patient struggles with losing his independence and reports he does not want to live the rest of his life depending on others.    Patient does not appear to be a danger to himself but more disappointed in medical  conditions at this time. CSW staffed this with psych MD and she will address sitter.

## 2012-06-16 NOTE — Progress Notes (Signed)
[facility] PROGRESS NOTE  Juan Wolf JYN:[id number] DOB: October 21, 1921 DOA: 06/07/2012 PCP: Ignatius Specking., MD  Assessment/Plan: Acute on Chronic diastolic CHF  -neg 1125cc for admission -weights with wide variations since admission--suspect inaccurate  -renal function improving - po furosemide per cardiology--appreciate followup  -EF 50% --11/22 echo -keep on "wet" side Depression/Suicidal ideation  -Continue sertraline 25 mg daily per psychiatry recommendation  -Increase sertraline to 50 mg daily starting 06/18/2012 -D/c Ambien as this may contribute to depression/emotional lability  -No suicidal ideation this morning--"I was in the rotten mood yesterday"  Acute on chronic renal failure  -continues to improve  Baseline crt ~1.2-1.3 - likely due to hypoperfusion  Acute hepatitis / transaminitis / coagulopathy  - passive hepatic congestion due to severe R heart failure  -exacerbation related to hypoperfusion due to volume depletion  -improving  Pulm HTN / R Heart failure/ Cor Pulmonale  RV severely dilated and hypokinetic via echo 11/22  11/22 TTE >> mod LVH, LVEF 50%, RV dilation, RA E, LAE, mod-severe AS, mild AI, IVC dilation, mod to severe RV dysfunction  -leave on "wet" side  Suprapubic discomfort/Bacteruria -improved  -repeat UA does not suggest UTI  -Will NOT start antibiotics at this time Lethargy/altered mental status  More lucid as the hospitalization has progressed Hypokalemia -Replete -Check magnesium      Family Communication:   POA Bill does not want residential hospice, await PASSR approval for SNF placement Disposition Plan:   SNF      Procedures/Studies: US Renal  06/08/2012  *RADIOLOGY REPORT*  Clinical Data: Acute renal failure.  RENAL/URINARY TRACT ULTRASOUND COMPLETE  Comparison:  Previous abdomen ultrasound.  Findings:  Right Kidney:  Normal, measuring 11.2 cm in length.  No hydronephrosis.  Left Kidney:  Normal, measuring 10.1 cm in  length.  No hydronephrosis.  Bladder:  Empty, with a Foley catheter in place.  IMPRESSION: Normal examination.   Original Report Authenticated By: Beckie Salts, M.D.    Dg Chest Port 1 View  06/09/2012  *RADIOLOGY REPORT*  Clinical Data: Cough and congestion.  PORTABLE CHEST - 1 VIEW  Comparison: 06/07/2012.  Findings: The heart remains enlarged.  There is persistent central vascular congestion, mild interstitial pulmonary edema, bilateral pleural effusions and bibasilar atelectasis.  Overall slight improved aeration may suggest resolving edema.  IMPRESSION: Persistent but slightly improved CHF.   Original Report Authenticated By: Rudie Meyer, M.D.    Dg Chest Port 1 View  06/07/2012  *RADIOLOGY REPORT*  Clinical Data: Congestive heart failure.  PORTABLE CHEST - 1 VIEW  Comparison: Chest 06/04/2012.  Findings: Bilateral pleural effusions and basilar airspace disease persist.  The patient's right effusion has worsened.  There is cardiomegaly and interstitial edema.  IMPRESSION: Increased interstitial edema.  Bilateral pleural effusions persist and the right effusion is larger than on the prior study.   Original Report Authenticated By: Holley Dexter, M.D.          Subjective: Patient is more lucid this morning. He denies any fevers, chills, chest pain, shortness breath, nausea, vomiting, diarrhea, abdominal pain. He complaints of dyspnea with exertion especially when working with physical therapy.  Objective: Filed Vitals:   06/15/12 0931 06/15/12 1658 06/15/12 2110 06/16/12 0505  BP: 141/66 136/66 123/66 147/79  Pulse: 70 73 70 68  Temp: 97.1 F (36.2 C) 98.7 F (37.1 C) 98.4 F (36.9 C) 97.8 F (36.6 C)  TempSrc: Oral Oral Oral Oral  Resp: 16 16 16 16   Height:      Weight:  96.3 kg (212 lb 4.9 oz)  SpO2: 98% 100% 100% 99%    Intake/Output Summary (Last 24 hours) at 06/16/12 4098 Last data filed at 06/16/12 0500  Gross per 24 hour  Intake    701 ml  Output   1200 ml  Net    -499 ml   Weight change: -1.1 kg (-2 lb 6.8 oz) Exam:   General:  Pt is alert, follows commands appropriately, not in acute distress  HEENT: No icterus, No thrush,  [place]/AT  Cardiovascular: IRRR,  no rubs, no gallops  Respiratory: Bibasilar crackles. No wheezes or rhonchi. Good air movement.  Abdomen: Soft/+BS, non tender, non distended, no guarding  Extremities: 2+ edema, No lymphangitis, No petechiae, No rashes, no synovitis  Data Reviewed: Basic Metabolic Panel:  Lab 06/16/12 1191 06/15/12 0912 06/14/12 0521 06/13/12 0530 06/12/12 0500  NA 138 139 138 139 140  K 2.9* 3.0* 3.8 3.7 3.0*  CL 97 97 99 100 101  CO2 29 29 24 26 25   GLUCOSE 119* 173* 104* 102* 103*  BUN 61* 64* 69* 75* 83*  CREATININE 1.76* 1.92* 2.06* 2.31* 2.77*  CALCIUM 9.0 8.7 8.6 9.0 8.6  MG -- -- -- -- 2.4  PHOS -- -- -- -- --   Liver Function Tests:  Lab 06/16/12 0608 06/15/12 0912 06/14/12 0521 06/13/12 0530 06/12/12 1215  AST 37 49* 73* 101* 175*  ALT 145* 184* 246* 334* 452*  ALKPHOS 118* 115 120* 125* 128*  BILITOT 1.4* 1.4* 1.5* 1.4* 1.3*  PROT 6.5 6.5 6.4 6.6 6.4  ALBUMIN 2.5* 2.6* 2.6* 2.7* 2.8*   No results found for this basename: LIPASE:5,AMYLASE:5 in the last 168 hours No results found for this basename: AMMONIA:5 in the last 168 hours CBC: No results found for this basename: WBC:5,NEUTROABS:5,HGB:5,HCT:5,MCV:5,PLT:5 in the last 168 hours Cardiac Enzymes: No results found for this basename: CKTOTAL:5,CKMB:5,CKMBINDEX:5,TROPONINI:5 in the last 168 hours BNP: No components found with this basename: POCBNP:5 CBG: No results found for this basename: GLUCAP:5 in the last 168 hours  Recent Results (from the past 240 hour(s))  MRSA PCR SCREENING     Status: Normal   Collection Time   06/07/12  2:42 PM      Component Value Range Status Comment   MRSA by PCR NEGATIVE  NEGATIVE Final   URINE CULTURE     Status: Normal   Collection Time   06/08/12 10:32 AM      Component Value Range  Status Comment   Specimen Description URINE, CATHETERIZED   Final    Special Requests Normal   Final    Culture  Setup Time 06/08/2012 11:00   Final    Colony Count NO GROWTH   Final    Culture NO GROWTH   Final    Report Status 06/09/2012 FINAL   Final   URINE CULTURE     Status: Normal   Collection Time   06/13/12  2:23 PM      Component Value Range Status Comment   Specimen Description URINE, RANDOM   Final    Special Requests NONE   Final    Culture  Setup Time 06/13/2012 18:20   Final    Colony Count >=100,000 COLONIES/ML   Final    Culture CITROBACTER KOSERI   Final    Report Status 06/15/2012 FINAL   Final    Organism ID, Bacteria CITROBACTER KOSERI   Final      Scheduled Meds:   . docusate sodium  100 mg Oral  BID  . feeding supplement  1 Container Oral TID BM  . furosemide  80 mg Oral Daily  . gabapentin  200 mg Oral QHS  . magic mouthwash  10 mL Oral TID  . sertraline  25 mg Oral Daily  . sodium chloride  3 mL Intravenous Q12H   Continuous Infusions:   . sodium chloride 10 mL/hr at 06/09/12 0800     Jaidan Stachnik, DO  [facility] Pager [phone]  If 7PM-7AM, please contact night-coverage [email or web address] Password [email or web address] 06/16/2012, 8:07 AM   LOS: 9 days

## 2012-06-16 NOTE — Progress Notes (Signed)
PT Cancellation Note  Patient Details Name: Juan Wolf MRN: [id number] DOB: 01/28/1922   Cancelled Treatment:    Reason Eval/Treat Not Completed: Medical issues which prohibited therapy (K 2.9 and has not yet begun repletion) Will reattempt as time allows   Delaney Meigs, PT [phone]

## 2012-06-16 NOTE — Progress Notes (Signed)
Patient ZO:[id number] JOQUAN Wolf      DOB: 1921-09-26      WUJ:[id number]     Palliative Medicine Team at [facility]  Progress Note  Subjective: patient complaining of sick stomach. Feels like he is being fed to much. Reports still feeling sad and hopeless. I shared with him that his son [name] would like to give him the chance to take some rehab at a facility closer to home. He was pleased about this . He stated that they "wore him out" We both agreed that being worn out was better than the alternative which was having too much time on his hands.  Filed Vitals:    06/16/12 1215   BP:  146/90   Pulse:  99   Temp:  97.7 F (36.5 C)   Resp:  30    Physical exam:  General : no acute distress continues to keep the left eye intermittently closed. Less puffy  Chest decreased but clear  CVS: irregular rate and rhythm S1, S2,  Abd obese , ventral hernia always a little tender, not excessively tender  Ext: trace edema  Neuro: oriented to person and place time is harder for him  Assessment and plan: 78 yr old with severe aortic stenosis , admitted with hypotension and syncope resulting in shock liver and acute renal failure. He remains weak and deconditioned but has recovered to some degree. He and his family would like to give him a chance to rehab at a SNF. So we await placement.  1. DNR/DNI  2. Pain control prn  3. Nausea: as needed meds  4. Bowels are regular last movement12/3  Dispositon: SNF when PASRR obtained.  Total time 15 min    Kamoni Gentles L. Ladona Ridgel, MD MBA  The Palliative Medicine Team at [facility] Phone: [phone]  Pager: [phone]

## 2012-06-16 NOTE — Progress Notes (Signed)
Palliative Medicine Team SW Follow up psychosocial visit with pt, sitter present. Pt c/o feeling "rough, sick to my stomach", lunch tray untouched. Reintroduced myself and role for emotional and grief support surrounding recent losses and current poor mood. Pt appreciative of support, states "I'm too worn out today" to talk about his mood. Pt does report desire to continue attempts to work with PT and his frustration with feeling too week to fully participate. Affirmed pt's efforts and feelings of frustration. Will continue to follow for emotional support as pt can tolerate.   Kennieth Francois, [place] Pager [phone]

## 2012-06-17 DIAGNOSIS — R0989 Other specified symptoms and signs involving the circulatory and respiratory systems: Secondary | ICD-10-CM

## 2012-06-17 DIAGNOSIS — R627 Adult failure to thrive: Secondary | ICD-10-CM

## 2012-06-17 DIAGNOSIS — F419 Anxiety disorder, unspecified: Secondary | ICD-10-CM

## 2012-06-17 DIAGNOSIS — R0600 Dyspnea, unspecified: Secondary | ICD-10-CM

## 2012-06-17 DIAGNOSIS — F411 Generalized anxiety disorder: Secondary | ICD-10-CM

## 2012-06-17 LAB — COMPREHENSIVE METABOLIC PANEL
Albumin: 2.7 g/dL — ABNORMAL LOW (ref 3.5–5.2)
Alkaline Phosphatase: 121 U/L — ABNORMAL HIGH (ref 39–117)
BUN: 57 mg/dL — ABNORMAL HIGH (ref 6–23)
Chloride: 98 mEq/L (ref 96–112)
Creatinine, Ser: 1.77 mg/dL — ABNORMAL HIGH (ref 0.50–1.35)
GFR calc Af Amer: 38 mL/min — ABNORMAL LOW (ref >90)
GFR calc non Af Amer: 33 mL/min — ABNORMAL LOW (ref >90)
Glucose, Bld: 115 mg/dL — ABNORMAL HIGH (ref 70–99)
Potassium: 3.8 mEq/L (ref 3.5–5.1)
Total Bilirubin: 1.5 mg/dL — ABNORMAL HIGH (ref 0.3–1.2)

## 2012-06-17 LAB — MAGNESIUM: Magnesium: 2.3 mg/dL (ref 1.5–2.5)

## 2012-06-17 MED ORDER — MORPHINE SULFATE (CONCENTRATE) 20 MG/ML PO SOLN
5.0000 mg | ORAL | Status: DC | PRN
  Administered 2012-06-17 – 2012-06-19 (×3): 5 mg via ORAL
  Filled 2012-06-17 (×3): qty 1

## 2012-06-17 MED ORDER — METOPROLOL TARTRATE 12.5 MG HALF TABLET
12.5000 mg | ORAL_TABLET | Freq: Two times a day (BID) | ORAL | Status: DC
  Filled 2012-06-17: qty 1

## 2012-06-17 MED ORDER — CIPROFLOXACIN HCL 250 MG PO TABS
250.0000 mg | ORAL_TABLET | Freq: Two times a day (BID) | ORAL | Status: DC
  Filled 2012-06-17 (×2): qty 1

## 2012-06-17 MED ORDER — BISACODYL 10 MG RE SUPP
10.0000 mg | RECTAL | Status: AC
  Administered 2012-06-17: 10 mg via RECTAL

## 2012-06-17 MED ORDER — BISACODYL 10 MG RE SUPP
10.0000 mg | Freq: Every day | RECTAL | Status: DC | PRN
  Filled 2012-06-17: qty 1

## 2012-06-17 MED ORDER — SENNOSIDES-DOCUSATE SODIUM 8.6-50 MG PO TABS
2.0000 | ORAL_TABLET | Freq: Once | ORAL | Status: AC
  Administered 2012-06-17: 2 via ORAL
  Filled 2012-06-17: qty 2

## 2012-06-17 MED ORDER — LORAZEPAM 0.5 MG PO TABS
1.0000 mg | ORAL_TABLET | Freq: Four times a day (QID) | ORAL | Status: DC | PRN
  Administered 2012-06-17: 1 mg via ORAL
  Filled 2012-06-17: qty 2

## 2012-06-17 NOTE — Progress Notes (Signed)
Progr Patient Identification:  Juan Wolf Date of Evaluation:  06/17/2012 RReason for Consult: Follow up Consultation End stage CHF, SI   Referring Provider: Dr. Arbutus Leas   History of Present Illness:Pt i seen i afternoon 06/16/12 .  s Numerous problems with CABG x 4, LATER replaced by 3 stents. He says father pt does ot drink alcohol He expressed suicidal thought yesterday in distress over his faily health and weakness.  He suffered with intermiittent claudication after the CABG. He has had atrial fibrillation, poor circulation and fluid retntion. He had bee driving and using a walker until he got out of breath. He said it was not worth trying to drive when he could not work anywhere.  He was a hard workder. 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 His sons learned how to repair cars and were the station mechanics. He was in [employer]; son does not know how long. He has gone to the [employer]. He says his father's demeanor has calm and rational. He had an organized system of taking his medications. He was short of breath, had loss of strength. He was orthopneic when sleeping Tim said his fathe fainted He had harmed liver and kidneys. The other sons are aaimmy and [name] the oldest. [name] has Power of attorney for health decisions.   Past Psychiatric History:tobacco dependence. He has grieved for his wife who died 8 mos. Ago.  He has a fixed misunderstanding after her death.  She was only in hospice a brief time and died.  He says hospice is where you go to get morphine [drip]  Past Medical History:     Past Medical History  Diagnosis Date  . Umbilical hernia without mention of obstruction or gangrene   . Other and unspecified hyperlipidemia   . COPD (chronic obstructive pulmonary disease)   . Hypertension   . Cramp of limb   . Atrial flutter     s/p RFCA   . Atrial fibrillation     tikosyn rx d/c in  setting of VT (possibly ischemia mediated though)  . CAD (coronary artery disease)     a) Status post balloon angioplasty, 1987, b) 4-vessel CABG, 1997, c) Bare metal stenting, SVG-RCA and SVG-LAD/diagonal graft, 06/2011, d) BMS stent to mid body of SVG to OM2 12/18/2011   . Aortic stenosis 06/05/2011  . Chronic kidney disease     CKD  . Complication of anesthesia     " The doctor told me not to let anyone put me to sleep"  . Myocardial infarction   . GERD (gastroesophageal reflux disease)   . Arthritis   . Chronic diastolic heart failure   . Ventricular tachycardia     12/2011  . Pulmonary HTN        Past Surgical History  Procedure Date  . Appendectomy   . Back surgery   . Cholecystectomy   . Hernia repair   . Cardiac catheterization 06/19/2011    Moderate pulmonary hypertension, moderate AS (valve area: 1.05), severe 3 vessel CAD. 90% stensis in both SVG to LAD/diagonal and SVG to RCA. Patent SVG to OM.   Marland Kitchen Coronary angioplasty 06/19/2011    BMS to both SVG to RCA and SVG to LAD/diagonal  . Coronary artery bypass graft     Allergies:  Allergies  Allergen Reactions  . Contrast Media (Iodinated Diagnostic Agents)     Using during MRI  Current Medications:  Prior to Admission medications   Medication Sig Start Date End Date Taking? Authorizing Provider  fluticasone (FLONASE) 50 MCG/ACT nasal spray Place 2 sprays into the nose daily as needed. For allergies   Yes Historical Provider, MD  furosemide (LASIX) 80 MG tablet Take 80 mg by mouth 2 (two) times daily.   Yes Historical Provider, MD  gabapentin (NEURONTIN) 100 MG capsule Take 200 mg by mouth at bedtime.   Yes Historical Provider, MD  hydrochlorothiazide (MICROZIDE) 12.5 MG capsule Take 1 capsule (12.5 mg total) by mouth daily. 06/04/12  Yes Prescott Parma, PA  metoprolol (LOPRESSOR) 50 MG tablet Take 1 tablet (50 mg total) by mouth 2 (two) times daily. 06/04/12  Yes Prescott Parma, PA  nitroGLYCERIN (NITROSTAT) 0.4 MG SL  tablet Place 1 tablet (0.4 mg total) under the tongue every 5 (five) minutes as needed. For chest pain. CALL YOUR DOCTOR BEFORE USING. 12/21/11  Yes Dayna N Dunn, PA  potassium chloride SA (K-DUR,KLOR-CON) 20 MEQ tablet Take 20 mEq by mouth 2 (two) times daily. 12/21/11  Yes Dayna N Dunn, PA  zolpidem (AMBIEN) 10 MG tablet Take 5-10 mg by mouth at bedtime as needed. sleep   Yes Historical Provider, MD    Social History:    reports that he quit smoking about 16 years ago. His smoking use included Cigars. He quit smokeless tobacco use about 67 years ago. His smokeless tobacco use included Chew. He reports that he does not drink alcohol or use illicit drugs.   Family History:    History reviewed. No pertinent family history.  Mental Status Examination/Evaluation: Objective:  Appearance: obese, kedematous, distended abdomen  Eye Contact::  Good  Keeps bliking with R eye often closed  Speech:  whispers  Volume:  Decreased  Mood:  Resigned to dying, would use my shotgun if I had it.  Affect:  Congruent and Depressed  Feels helpless  Thought Process:  Coherent and Logical  Orientation:  Other:  knows date, situation, person place  Thought Content:  feeling weak and helpless  Suicidal Thoughts:  No  Passive SI  Homicidal Thoughts:  No  Judgement:  Fair  Insight:  Fair   DIAGNOSIS:   AXIS I   Depression due to debilitating medical condition  AXIS II  Deferred  AXIS III See medical notes.  AXIS IV other psychosocial or environmental problems, problems related to social environment, problems with primary support group and wants to close his eyes and never wake up  AXIS V 41-50 serious symptoms   Assessment/Plan:  Discussed with Dr. Arbutus Leas and Psych CSW, Member of Palliative Care   Pt is quiet, appears to be sleeping and wakes to his name.  No sons are not present.   He feels weak and says he just wants to go to sleep and never wake up.  He is asked MSE.  He is oriented but concentration is poor.   He interprets proverb wih abstract reasoning.  He agrees to go to SNF/He names hospice.  Then, he says you get morphine.  He is told not necessarily; only IF it is indicated to help a person be more comfortable.  He mentions that was where he wife went.  He also agrees to go to hopsice.  RECOMMENDATION:  1.  Pt is very weak and c/o abdominal pain.  He has capacity to express his preference for care. 2.  There is a reported disconnect between pt and his sons.  It is important to meet  with sons and explore their understanding of pt's [father's] condition.  3.  No need to continue sitter.  Pt is not actively suicidal and too weak to attempt anything. 4.  Encourage meeting with sons to provide update, rationale for options.      This is iportant bc eldest son has POA 5. Will follow pt.  Destony Prevost 06/17/2012 1:09 AM    ess Note  F/u consultation:

## 2012-06-17 NOTE — Progress Notes (Signed)
Patient had a 12 beat run of V-Tach. Patient was asymptomatic at the time. No complaints and no signs or symptoms of distress or discomfort. BP is 136/84 and HR 62. PA is aware. Will continue to monitor patient for further changes in condition.

## 2012-06-17 NOTE — Progress Notes (Signed)
Progress Note from the Palliative Medicine Team at [facility]  Subjective:  Patient alert and oriented and with capacity.  I sat and talked with patient about his present situation and he understands "how sick I am".  We discussed his wishes and he verbalizes his focus of comfort.  He denies  suicidal ideations, but states " I don't want to live like this"   Spoke to son and daughter in law Hankins and Parrish in details regarding GOC and options  Returned this evening and spoke again with son Jonavan Vanhorn and his wife Olegario Messier  to discuss diagnosis prognosis, GOC, EOL wishes disposition and options.  A detailed discussion was had today with both sons regarding advanced directives.  Concepts specific to code status, artifical feeding and hydration, continued IV antibiotics and rehospitalization was had.  The difference between a aggressive medical intervention path  and a palliative comfort care path for this patient at this time was had.  Values and goals of care important to patient and family were attempted to be elicited.  Concept of Hospice and Palliative Care were discussed  Natural trajectory and expectations at EOL were discussed.  Questions and concerns addressed.  Hard Choices booklet left for review. Family encouraged to call with questions or concerns.  PMT will continue to support holistically.  MOST form was completed and signed by son Ronel Rodeheaver entire form  was discussed by speaker phone with son Nikolus Marczak.   Objective: Allergies  Allergen Reactions  . Contrast Media (Iodinated Diagnostic Agents)     Using during MRI   Scheduled Meds:   . bisacodyl  10 mg Rectal NOW  . docusate sodium  100 mg Oral BID  . feeding supplement  1 Container Oral TID BM  . furosemide  80 mg Oral Daily  . gabapentin  200 mg Oral QHS  . magic mouthwash  10 mL Oral TID  . [COMPLETED] potassium chloride  10 mEq Intravenous Q1 Hr x 2  . potassium chloride  40 mEq Oral Daily  . sertraline  25  mg Oral Daily  . sodium chloride  3 mL Intravenous Q12H   Continuous Infusions:   . sodium chloride 10 mL/hr at 06/09/12 0800   PRN Meds:.acetaminophen, acetaminophen, albuterol, bisacodyl, fluticasone, nitroGLYCERIN, ondansetron (ZOFRAN) IV, ondansetron, oxyCODONE, zolpidem, [DISCONTINUED] ondansetron (ZOFRAN) IV, [DISCONTINUED] ondansetron (ZOFRAN) IV, [DISCONTINUED] ondansetron, [DISCONTINUED] ondansetron  BP 143/63  Pulse 73  Temp 98.1 F (36.7 C) (Oral)  Resp 18  Ht 5\' 9"  (1.753 m)  Wt 96.5 kg (212 lb 11.9 oz)  BMI 31.42 kg/m2  SpO2 99%   PPS:30%  Pain Score: denies presetnly    Intake/Output Summary (Last 24 hours) at 06/17/12 1242 Last data filed at 06/17/12 1059  Gross per 24 hour  Intake    163 ml  Output   1050 ml  Net   -887 ml      LBM: non documented   Stool Softner: Dulcolax supp today with out results, add senna s two  tablets tonight at bedtime  Physical Exam:  General: chronically ill appearing, weak, NAD HEENT:  Dry buccal membranes Chest:   diminished in bases  CTA CVS: RRR Abdomen: soft NT +BS Ext: without edeam Neuro: alert and oriented   Labs: CBC    Component Value Date/Time   WBC 6.1 06/09/2012 0425   RBC 4.08* 06/09/2012 0425   HGB 10.4* 06/09/2012 0425   HCT 34.3* 06/09/2012 0425   PLT 41* 06/09/2012 0425   MCV 84.1 06/09/2012 0425   MCH 25.5* 06/09/2012 0425   MCHC 30.3 06/09/2012 0425   RDW 18.0* 06/09/2012 0425   LYMPHSABS 2.0 09/09/2011 2052   MONOABS 0.7 09/09/2011 2052   EOSABS 0.1 09/09/2011 2052   BASOSABS 0.0 09/09/2011 2052    BMET    Component Value Date/Time   NA 139 06/17/2012 0510   K 3.8 06/17/2012 0510   CL 98 06/17/2012 0510   CO2 29 06/17/2012 0510   GLUCOSE 115* 06/17/2012 0510   BUN 57* 06/17/2012 0510   CREATININE 1.77* 06/17/2012 0510   CALCIUM 9.2 06/17/2012 0510   GFRNONAA 33* 06/17/2012 0510   GFRAA 38*  06/17/2012 0510    CMP     Component Value Date/Time   NA 139 06/17/2012 0510   K 3.8 06/17/2012 0510   CL 98 06/17/2012 0510   CO2 29 06/17/2012 0510   GLUCOSE 115* 06/17/2012 0510   BUN 57* 06/17/2012 0510   CREATININE 1.77* 06/17/2012 0510   CALCIUM 9.2 06/17/2012 0510   PROT 6.9 06/17/2012 0510   ALBUMIN 2.7* 06/17/2012 0510   AST 35 06/17/2012 0510   ALT 129* 06/17/2012 0510   ALKPHOS 121* 06/17/2012 0510   BILITOT 1.5* 06/17/2012 0510   GFRNONAA 33* 06/17/2012 0510   GFRAA 38* 06/17/2012 0510    Assessment and Plan: 1. Code Status:DNR/DNI--comfort is main focus of care   2. Symptom Control:      Pain/Dyspnea: Roxanol      Anxiety: Ativan      Constipation: Senna s  2 tablets tonight at bedtime  3. Psycho/Social: Emotional support offered to patient and family, all understand the overall poor prognosis and wish to focus on comfort 4. Spiritual  Chaplain services consulted 5. Disposition:  Hopeful for [place] hospice [place]    Time In Time Out Total Time Spent with Patient Total Overall Time  1215 1315 60 min 60 min    Greater than 50%  of this time was spent counseling and coordinating care related to the above assessment and plan.  Dr Jomarie Longs aware of above  Lorinda Creed NP [phone]   1

## 2012-06-17 NOTE — Progress Notes (Addendum)
[facility] PROGRESS NOTE  Juan Wolf EAV:[id number] DOB: Jan 19, 1922 DOA: 06/07/2012 PCP: Ignatius Specking., MD  Assessment/Plan: Acute on Chronic diastolic CHF , severe AS -neg [id number] for admission -weights with wide variations since admission--suspect inaccurate  -renal function improving - po furosemide per cardiology--appreciate followup  -EF 50% --11/22 echo -keep on "wet" side per cardiology  Depression/Suicidal ideation  -Continue sertraline 25 mg daily per psychiatry recommendation  -Increase sertraline to 50 mg daily starting 06/18/2012 -D/c Ambien as this may contribute to depression/emotional lability  -Still with accusations of frustration, no plan- psych following   Acute on chronic renal failure  -stable around 1.7 now Baseline crt ~1.2-1.3 - likely due to hypoperfusion   Acute hepatitis / transaminitis / coagulopathy  - passive hepatic congestion due to severe R heart failure  -exacerbation related to hypoperfusion due to volume depletion  -improving   Pulm HTN / R Heart failure/ Cor Pulmonale  RV severely dilated and hypokinetic via echo 11/22  11/22 TTE >> mod LVH, LVEF 50%, RV dilation, RA E, LAE, mod-severe AS, mild AI, IVC dilation, mod to severe RV dysfunction  -leave on "wet" side   Citrobacter UTI with Suprapubic discomfort/Bacteruria -treat with ciprofloxacin  Lethargy/altered mental status  More lucid as the hospitalization has progressed  Hypokalemia -Replete -Check magnesium   Family Communication:   POA Bill does not want residential hospice, await PASSR approval for SNF placement Disposition Plan:   SNF    Procedures/Studies: US Renal  06/08/2012  *RADIOLOGY REPORT*  Clinical Data: Acute renal failure.  RENAL/URINARY TRACT ULTRASOUND COMPLETE  Comparison:  Previous abdomen ultrasound.  Findings:  Right Kidney:  Normal, measuring 11.2 cm in length.  No hydronephrosis.  Left Kidney:  Normal, measuring 10.1 cm in length.  No  hydronephrosis.  Bladder:  Empty, with a Foley catheter in place.  IMPRESSION: Normal examination.   Original Report Authenticated By: Beckie Salts, M.D.    Dg Chest Port 1 View  06/09/2012  *RADIOLOGY REPORT*  Clinical Data: Cough and congestion.  PORTABLE CHEST - 1 VIEW  Comparison: 06/07/2012.  Findings: The heart remains enlarged.  There is persistent central vascular congestion, mild interstitial pulmonary edema, bilateral pleural effusions and bibasilar atelectasis.  Overall slight improved aeration may suggest resolving edema.  IMPRESSION: Persistent but slightly improved CHF.   Original Report Authenticated By: Rudie Meyer, M.D.    Dg Chest Port 1 View  06/07/2012  *RADIOLOGY REPORT*  Clinical Data: Congestive heart failure.  PORTABLE CHEST - 1 VIEW  Comparison: Chest 06/04/2012.  Findings: Bilateral pleural effusions and basilar airspace disease persist.  The patient's right effusion has worsened.  There is cardiomegaly and interstitial edema.  IMPRESSION: Increased interstitial edema.  Bilateral pleural effusions persist and the right effusion is larger than on the prior study.   Original Report Authenticated By: Holley Dexter, M.D.          Subjective: Told RN and tech that "he wants to end it all", breathing still short occasionally,  He denies any fevers, chills, chest pain, shortness breath, nausea, vomiting, diarrhea, abdominal pain., physically very weak  Objective: Filed Vitals:   06/16/12 1040 06/16/12 1447 06/16/12 2030 06/17/12 0533  BP: 130/60 132/53 130/53 125/66  Pulse: 67 92 102 61  Temp:  97.3 F (36.3 C) 97.2 F (36.2 C) 98.1 F (36.7 C)  TempSrc:  Oral Oral Oral  Resp:  18 19 18   Height:      Weight:    96.5 kg (212 lb 11.9 oz)  SpO2:  100% 100% 99%    Intake/Output Summary (Last 24 hours) at 06/17/12 0840 Last data filed at 06/17/12 0604  Gross per 24 hour  Intake    263 ml  Output   1050 ml  Net   -787 ml   Weight change: 0.2 kg (7.1  oz) Exam:   General:  Pt is alert, follows commands appropriately, not in acute distress  HEENT: No icterus, No thrush,  [place]/AT  Cardiovascular: IRRR,  no rubs, no gallops  Respiratory: Bibasilar crackles. No wheezes or rhonchi. Good air movement.  Abdomen: Soft/+BS, non tender, non distended, no guarding, scrotal swelling  Extremities: 2+ edema, No lymphangitis, No petechiae, No rashes, no synovitis  Data Reviewed: Basic Metabolic Panel:  Lab 06/17/12 6213 06/16/12 0608 06/15/12 0912 06/14/12 0521 06/13/12 0530 06/12/12 0500  NA 139 138 139 138 139 --  K 3.8 2.9* 3.0* 3.8 3.7 --  CL 98 97 97 99 100 --  CO2 29 29 29 24 26  --  GLUCOSE 115* 119* 173* 104* 102* --  BUN 57* 61* 64* 69* 75* --  CREATININE 1.77* 1.76* 1.92* 2.06* 2.31* --  CALCIUM 9.2 9.0 8.7 8.6 9.0 --  MG 2.3 -- -- -- -- 2.4  PHOS -- -- -- -- -- --   Liver Function Tests:  Lab 06/17/12 0510 06/16/12 0608 06/15/12 0912 06/14/12 0521 06/13/12 0530  AST 35 37 49* 73* 101*  ALT 129* 145* 184* 246* 334*  ALKPHOS 121* 118* 115 120* 125*  BILITOT 1.5* 1.4* 1.4* 1.5* 1.4*  PROT 6.9 6.5 6.5 6.4 6.6  ALBUMIN 2.7* 2.5* 2.6* 2.6* 2.7*   No results found for this basename: LIPASE:5,AMYLASE:5 in the last 168 hours No results found for this basename: AMMONIA:5 in the last 168 hours CBC: No results found for this basename: WBC:5,NEUTROABS:5,HGB:5,HCT:5,MCV:5,PLT:5 in the last 168 hours Cardiac Enzymes: No results found for this basename: CKTOTAL:5,CKMB:5,CKMBINDEX:5,TROPONINI:5 in the last 168 hours BNP: No components found with this basename: POCBNP:5 CBG: No results found for this basename: GLUCAP:5 in the last 168 hours  Recent Results (from the past 240 hour(s))  MRSA PCR SCREENING     Status: Normal   Collection Time   06/07/12  2:42 PM      Component Value Range Status Comment   MRSA by PCR NEGATIVE  NEGATIVE Final   URINE CULTURE     Status: Normal   Collection Time   06/08/12 10:32 AM      Component  Value Range Status Comment   Specimen Description URINE, CATHETERIZED   Final    Special Requests Normal   Final    Culture  Setup Time 06/08/2012 11:00   Final    Colony Count NO GROWTH   Final    Culture NO GROWTH   Final    Report Status 06/09/2012 FINAL   Final   URINE CULTURE     Status: Normal   Collection Time   06/13/12  2:23 PM      Component Value Range Status Comment   Specimen Description URINE, RANDOM   Final    Special Requests NONE   Final    Culture  Setup Time 06/13/2012 18:20   Final    Colony Count >=100,000 COLONIES/ML   Final    Culture CITROBACTER KOSERI   Final    Report Status 06/15/2012 FINAL   Final    Organism ID, Bacteria CITROBACTER KOSERI   Final      Scheduled Meds:    .  docusate sodium  100 mg Oral BID  . feeding supplement  1 Container Oral TID BM  . furosemide  80 mg Oral Daily  . gabapentin  200 mg Oral QHS  . magic mouthwash  10 mL Oral TID  . [COMPLETED] potassium chloride  10 mEq Intravenous Q1 Hr x 2  . potassium chloride  40 mEq Oral Daily  . sertraline  25 mg Oral Daily  . sodium chloride  3 mL Intravenous Q12H   Continuous Infusions:    . sodium chloride 10 mL/hr at 06/09/12 0800     Delita Chiquito, MD  [facility] Pager [phone]  If 7PM-7AM, please contact night-coverage [email or web address] Password [email or web address] 06/17/2012, 8:40 AM   LOS: 10 days

## 2012-06-17 NOTE — Progress Notes (Signed)
Patient Identification:  Juan Wolf Date of Evaluation:  06/17/2012 Reason for Consult:  Cardiac problems with progressive decompensation contributing to depression and related suicidal remarks.   Referring Provider: Dr. Jomarie Longs  History of Present Illness:pt is admitted for an 78 year old Caucasian male with a past medical history of coronary artery disease status post CABG and subsequent PCI , history of atrial fibrillation not on anticoagulation secondary to gastrointestinal bleeding, moderate to severe aortic stenosis, diastolic heart failure.   He has been very uncomfortable and very somnolent.  He has made discouraging comments of  Despair; saying he would kill himself were he able to get a gun.  This created great concern and he was place on suicide watch.  The family -sons with spouse [s] able to gather tonight, met with representative of Palliative care and Dr. Jomarie Longs.  The purpose of palliative care is discussed with family to enable them to understand and ask questions.      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  Serving many for heat and fuel in his community.  His wife became ill within less a year and spent only a short time before she died.  This skewed the family's understanding of Hospice care. "You go there to get morphine and die."  He has been under a lot of stress due to his progressive, complicated cardiac dysfunction and related complications.  This has reached a point in time where his body reserves are depleted and can no longer sustain prior level of function.  He is discouraged and recognizes he is not getting better.  Therefore, he has only expressed his despair with reference to suicide.     Past Medical History:     Past Medical History  Diagnosis Date  . Umbilical hernia without mention of obstruction or gangrene   . Other and unspecified hyperlipidemia   . COPD  (chronic obstructive pulmonary disease)   . Hypertension   . Cramp of limb   . Atrial flutter     s/p RFCA   . Atrial fibrillation     tikosyn rx d/c in setting of VT (possibly ischemia mediated though)  . CAD (coronary artery disease)     a) Status post balloon angioplasty, 1987, b) 4-vessel CABG, 1997, c) Bare metal stenting, SVG-RCA and SVG-LAD/diagonal graft, 06/2011, d) BMS stent to mid body of SVG to OM2 12/18/2011   . Aortic stenosis 06/05/2011  . Chronic kidney disease     CKD  . Complication of anesthesia     " The doctor told me not to let anyone put me to sleep"  . Myocardial infarction   . GERD (gastroesophageal reflux disease)   . Arthritis   . Chronic diastolic heart failure   . Ventricular tachycardia     12/2011  . Pulmonary HTN        Past Surgical History  Procedure Date  . Appendectomy   . Back surgery   . Cholecystectomy   . Hernia repair   . Cardiac catheterization 06/19/2011    Moderate pulmonary hypertension, moderate AS (valve area: 1.05), severe 3 vessel CAD. 90% stensis in both SVG to LAD/diagonal and SVG to RCA. Patent SVG to OM.   Marland Kitchen Coronary angioplasty 06/19/2011    BMS to both SVG to RCA and SVG to LAD/diagonal  . Coronary artery bypass graft     Allergies:  Allergies  Allergen Reactions  . Contrast  Media (Iodinated Diagnostic Agents)     Using during MRI    Current Medications:  Prior to Admission medications   Medication Sig Start Date End Date Taking? Authorizing Provider  fluticasone (FLONASE) 50 MCG/ACT nasal spray Place 2 sprays into the nose daily as needed. For allergies   Yes Historical Provider, MD  furosemide (LASIX) 80 MG tablet Take 80 mg by mouth 2 (two) times daily.   Yes Historical Provider, MD  gabapentin (NEURONTIN) 100 MG capsule Take 200 mg by mouth at bedtime.   Yes Historical Provider, MD  hydrochlorothiazide (MICROZIDE) 12.5 MG capsule Take 1 capsule (12.5 mg total) by mouth daily. 06/04/12  Yes Prescott Parma, PA   metoprolol (LOPRESSOR) 50 MG tablet Take 1 tablet (50 mg total) by mouth 2 (two) times daily. 06/04/12  Yes Prescott Parma, PA  nitroGLYCERIN (NITROSTAT) 0.4 MG SL tablet Place 1 tablet (0.4 mg total) under the tongue every 5 (five) minutes as needed. For chest pain. CALL YOUR DOCTOR BEFORE USING. 12/21/11  Yes Dayna N Dunn, PA  potassium chloride SA (K-DUR,KLOR-CON) 20 MEQ tablet Take 20 mEq by mouth 2 (two) times daily. 12/21/11  Yes Dayna N Dunn, PA  zolpidem (AMBIEN) 10 MG tablet Take 5-10 mg by mouth at bedtime as needed. sleep   Yes Historical Provider, MD    Social History:    reports that he quit smoking about 16 years ago. His smoking use included Cigars. He quit smokeless tobacco use about 67 years ago. His smokeless tobacco use included Chew. He reports that he does not drink alcohol or use illicit drugs.   Family History:    History reviewed. No pertinent family history.  Mental Status Examination/Evaluation: Objective:  Appearance: Fairly Groomed and obese, limited range of motion.   Eye Contact::  intermittent L eyelid is weaker than right.  Speech:  Clear and Coherent and Slow  Volume:  Decreased  Mood:  Depressed with limitations of bodily runction.   Affect:  Depressed and Flat  Thought Process:  Coherent, Goal Directed and Logical  Orientation:  Full (Time, Place, and Person)  Thought Content:  hopelesssness  Suicidal Thoughts:  No  Homicidal Thoughts:  No  Judgement:  Good  Insight:  Good   DIAGNOSIS:   AXIS I   Major Depression due to other medical conditions,  AXIS II  Deferred  AXIS III See medical notes.  AXIS IV other psychosocial or environmental problems, problems related to social environment and depressed with passive death wish  AXIS V 41-50 serious symptoms   Assessment/Plan:  Discussed with Dr. Jomarie Longs. Discussed with Psych CSW, Palliative Care Pt is lying with supportive pillows  for comfort.  He opens his eyes.  He is alert but sounds weak.  His is   palliative care. He has an irregular, atrial fibrillation heart rate.   He can move both arms with ease.  He says the legs are more difficult to move.  He got abdominal relief today.; reports no abdominal pain.    He may benefit from SNRI, duloxetine, Cymbalta, 30 mg if cardiac condition permits.  It is reported that the combined meeting of pt, family members and palliative care representative enabled the family to understand the process and breath of palliative care.  The family and patient [father] were in agreement.  RECOMMENDATION:  1.  Pt has capacity and agrees to transfer to SNF palliative care. 2.  Suggest dismiss sitter.  Pt is not actively thinking about self-harm. 3.  Suggest Cymbalta 30  mg to address depression, anxiety. If EKG and cardiac function permits.  4.  No further psychiatric needs.  MD Psychiatrist signs off. Mickeal Skinner  MD                                                                                                                                                   06/17/2012 4:51 PM

## 2012-06-17 NOTE — Progress Notes (Signed)
Patient Name: Juan Wolf Date of Encounter: 06/17/2012   Active Problems:  HYPERTENSION  COPD  CAD (coronary artery disease)  Aortic stenosis  Acute on chronic diastolic CHF (congestive heart failure), NYHA class 1  Ventricular tachycardia  Renal insufficiency  Anemia  Atrial fibrillation  Syncope  Hypotension  Acute on chronic renal failure  Shock liver  Right heart failure  Back pain  Major depression  Hypokalemia    SUBJECTIVE  Feels much better.  Says breathing is fine/better.  Reports 3 brief episodes of chest pain yesterday - each lasting a few seconds @ a time.  CURRENT MEDS    . docusate sodium  100 mg Oral BID  . feeding supplement  1 Container Oral TID BM  . furosemide  80 mg Oral Daily  . gabapentin  200 mg Oral QHS  . magic mouthwash  10 mL Oral TID  . [COMPLETED] potassium chloride  10 mEq Intravenous Q1 Hr x 2  . potassium chloride  40 mEq Oral Daily  . sertraline  25 mg Oral Daily  . sodium chloride  3 mL Intravenous Q12H   OBJECTIVE  Filed Vitals:   06/16/12 1040 06/16/12 1447 06/16/12 2030 06/17/12 0533  BP: 130/60 132/53 130/53 125/66  Pulse: 67 92 102 61  Temp:  97.3 F (36.3 C) 97.2 F (36.2 C) 98.1 F (36.7 C)  TempSrc:  Oral Oral Oral  Resp:  18 19 18   Height:      Weight:    212 lb 11.9 oz (96.5 kg)  SpO2:  100% 100% 99%    Intake/Output Summary (Last 24 hours) at 06/17/12 1014 Last data filed at 06/17/12 0604  Gross per 24 hour  Intake    263 ml  Output   1050 ml  Net   -787 ml   Filed Weights   06/15/12 0300 06/16/12 0505 06/17/12 0533  Weight: 214 lb 11.7 oz (97.4 kg) 212 lb 4.9 oz (96.3 kg) 212 lb 11.9 oz (96.5 kg)   PHYSICAL EXAM  General: Pleasant, NAD. Neuro: Alert and oriented X 3. Moves all extremities spontaneously. Psych: Normal affect. HEENT:  Normal  Neck: Supple without bruits.  Moderately elevated JVP. Lungs:  Resp regular and unlabored, diminished breath sounds @ bilat bases. Heart: IR, IR, 2/6  SEM @ rusb - heard throughout. Abdomen: Soft, non-tender, non-distended, BS + x 4.  Extremities: No clubbing, cyanosis.  1-2+ bilat LE edema to knees. DP/PT/Radials 1+ and equal bilaterally.  Accessory Clinical Findings  Basic Metabolic Panel  Basename 06/17/12 0510 06/16/12 0608  NA 139 138  K 3.8 2.9*  CL 98 97  CO2 29 29  GLUCOSE 115* 119*  BUN 57* 61*  CREATININE 1.77* 1.76*  CALCIUM 9.2 9.0  MG 2.3 --  PHOS -- --   Liver Function Tests  Basename 06/17/12 0510 06/16/12 0608  AST 35 37  ALT 129* 145*  ALKPHOS 121* 118*  BILITOT 1.5* 1.4*  PROT 6.9 6.5  ALBUMIN 2.7* 2.5*   TELE  Afib, occas pvc's.  ASSESSMENT AND PLAN  1.  Acute on chronic diastolic CHF with Moderately Severe AS:  He remains volume overloaded.  Weight improved from yesterday.  Continue lasix 80mg  po daily as previously outlined with goal of keeping him on the wet side given narrow euvolemic window and propensity for hypotension resulting in syncope.  HR/BP reasonably controlled.  2.  Moderately Severe AS:  Conservative mgmt.  Not felt to be a candidate for open replacement or  TAVR.  3.  CAD:  3 brief episodes of c/p yesterday - seconds @ a time.  Cont medical therapy.  4.  CKD Stage III:  Creat stable.  5.  Major Depression:  Per psych.  6.  Hypokalemia:  Improved.  7.  Syncope:  No recurrence.  Avoid hypotn as above.  Signed, Nicolasa Ducking NP

## 2012-06-17 NOTE — Progress Notes (Signed)
Clinical Social Work Progress Note PSYCHIATRY SERVICE LINE 06/17/2012  Patient:  Juan Wolf  Account:  [id number]  Admit Date:  06/07/2012  Clinical Social Worker:  Unk Lightning, LCSW  Date/Time:  06/17/2012 02:00 PM  Review of Patient  Overall Medical Condition:   Patient reports his stomach is hurting and he cannot have a BM. Patient reports that he does not have an appetite.   Participation Level:  Active  Participation Quality  Drowsy   Other Participation Quality:   Affect  Depressed   Cognitive  Alert   Reaction to Medications/Concerns:   None reported   Modes of Intervention  Support  Clarification   Summary of Progress/Plan at Discharge   CSW met with patient at bedside. Sitter reports that patient has not felt well today and expressed SI this morning to sitter.    CSW woke patient and sat to have session with him. Patient reports that his stomach hurts and he is tired of laying in the bed. CSW and patient continued conversation from yesterday regarding patient expressing his feelings to his sons. Patient asked clarification regarding SNF vs hospice. Patient reports that he is tired and just wants "to give up." CSW inquired about SI and comments made this morning. Patient denied any comments made to sitter. After CSW informed patient that sitter informed CSW of statements, patient reported that he was just feeling bad. Patient and CSW discussed if patient felt comfortable expressing his feelings. Patient reports that he "just wants to leave the hospital" and wants others to know how is feeling. CSW encouraged patient to work on communicating his grief and sadness of loss of independence. CSW encouraged to voice SI if he was experiencing them but also spoke about the benefit of effectively communicating his depression if that was what he meant. When CSW asked about patient telling his sons about feeling depressed due to medical illness, patient reports "[name] just makes  all the decisions. He can decide what is best."    Patient had depressed affect during the assessment. Patient kept his eyes closed during most of the session. Patient appears to be struggling with emotions of hospitalization. Patient denies SI but makes suicidal remarks. Patient often revokes those statements and later reports that it was because of how he physically feeling at the time. It does not appear that patient is a danger to himself at this time but rather struggling with emotions of medical decisions and medical illness. CSW will continue to follow.

## 2012-06-17 NOTE — Progress Notes (Signed)
    I have seen and examined the patient. I agree with the above note with the addition of : The patient seems to be clinically better. However, he seems to be very depressed. He c/o abdominal pain due to not having a BM since hospitalization.  Volume status appears to be better. Renal function is improved.  Continue same dose Lasix.  I will start a small dose Metoprolol.  Recommend getting him out of bed and start PT/OT.  Treat constipation.   [name] MD, [facility] 06/17/2012 5:30 PM

## 2012-06-18 DIAGNOSIS — R627 Adult failure to thrive: Secondary | ICD-10-CM

## 2012-06-18 LAB — GLUCOSE, CAPILLARY

## 2012-06-18 MED ORDER — FUROSEMIDE 10 MG/ML IJ SOLN
INTRAMUSCULAR | Status: AC
  Filled 2012-06-18: qty 4

## 2012-06-18 MED ORDER — POLYETHYLENE GLYCOL 3350 17 G PO PACK
17.0000 g | PACK | Freq: Two times a day (BID) | ORAL | Status: DC
  Administered 2012-06-18 – 2012-06-19 (×3): 17 g via ORAL
  Filled 2012-06-18 (×4): qty 1

## 2012-06-18 NOTE — Progress Notes (Signed)
Patient is having nausea and vomiting, patient given 4mg  of Zofran; will continue to monitor patient.  Lorretta Harp RN

## 2012-06-18 NOTE — Progress Notes (Signed)
Utilization Review Completed.   Clenton Esper, RN, BSN Nurse Case Manager  [phone]  

## 2012-06-18 NOTE — Progress Notes (Signed)
Patient given 4mg  of Zofran for nausea and vomiting; will continue to monitor patient.  Lorretta Harp RN

## 2012-06-18 NOTE — Progress Notes (Signed)
Initial referral was made to CSW for SNF placement; however, patient is now requesting residential Hospice and family is now agreeable after consult was completed with Kandy Garrison, Palliative Care.  They are requesting placement at the [facility]. Patient's wife passed away in this facility after an extremely brief stay last year.  Referral completed to [facility]; she will review referral and call back with possible bed offer.  Per MD- patient is medically ready for transfer to Hospice Home once bed is secured.  Above discussed with patient and his daughter-in-law Waynetta Sandy (pt's son Brennon was at work) and they are pleased with referral.  Waynetta Sandy will notify Raymundo of above.  CSW will continue to monitor.  Lorri Frederick. [place]  [phone]

## 2012-06-18 NOTE — Progress Notes (Signed)
   Patient Name: Juan Wolf Date of Encounter: 06/18/2012      SUBJECTIVE Patient denies CP or dyspnea  CURRENT MEDS    . [COMPLETED] bisacodyl  10 mg Rectal NOW  . docusate sodium  100 mg Oral BID  . feeding supplement  1 Container Oral TID BM  . furosemide  80 mg Oral Daily  . magic mouthwash  10 mL Oral TID  . [COMPLETED] senna-docusate  2 tablet Oral Once  . sertraline  25 mg Oral Daily  . sodium chloride  3 mL Intravenous Q12H  . [DISCONTINUED] ciprofloxacin  250 mg Oral BID  . [DISCONTINUED] gabapentin  200 mg Oral QHS  . [DISCONTINUED] metoprolol tartrate  12.5 mg Oral BID  . [DISCONTINUED] potassium chloride  40 mEq Oral Daily   OBJECTIVE  Filed Vitals:   06/17/12 1019 06/17/12 1300 06/17/12 2055 06/18/12 0418  BP: 143/63 136/84 132/67 145/75  Pulse: 73 62 81 93  Temp:   97.1 F (36.2 C) 97.2 F (36.2 C)  TempSrc:   Oral Oral  Resp:   20 20  Height:      Weight:    212 lb 1.3 oz (96.2 kg)  SpO2:   99% 95%    Intake/Output Summary (Last 24 hours) at 06/18/12 0654 Last data filed at 06/18/12 0420  Gross per 24 hour  Intake    603 ml  Output   1125 ml  Net   -522 ml   Filed Weights   06/16/12 0505 06/17/12 0533 06/18/12 0418  Weight: 212 lb 4.9 oz (96.3 kg) 212 lb 11.9 oz (96.5 kg) 212 lb 1.3 oz (96.2 kg)   PHYSICAL EXAM  General: NAD. Neuro: Alert and oriented X 3.  HEENT:  Normal  Neck: Supple  Lungs:  Resp regular and unlabored, diminished breath sounds @ bilat bases. Heart: IR, IR, 2/6 SEM @ rusb - heard throughout. Abdomen: Soft, non-tender, non-distended, hernia noted Extremities:  2+ bilat LE edema to knees.   Accessory Clinical Findings  Basic Metabolic Panel  Basename 06/17/12 0510 06/16/12 0608  NA 139 138  K 3.8 2.9*  CL 98 97  CO2 29 29  GLUCOSE 115* 119*  BUN 57* 61*  CREATININE 1.77* 1.76*  CALCIUM 9.2 9.0  MG 2.3 --  PHOS -- --   Liver Function Tests  Basename 06/17/12 0510 06/16/12 0608  AST 35 37  ALT 129*  145*  ALKPHOS 121* 118*  BILITOT 1.5* 1.4*  PROT 6.9 6.5  ALBUMIN 2.7* 2.5*    ASSESSMENT AND PLAN  1.  Acute on chronic diastolic CHF with Moderately Severe AS:  Continue lasix 80mg  po daily as previously outlined with goal of keeping him on the wet side given narrow euvolemic window and propensity for hypotension resulting in syncope.  HR/BP reasonably controlled.  2.  Moderately Severe AS:  Conservative mgmt.  Not felt to be a candidate for open replacement or TAVR.  3.  CAD:  Cont medical therapy.  4.  CKD Stage III:  Creat improving  5.  Major Depression:  Per psych.  6.  Syncope:  No recurrence.  Avoid hypotn as above.  Signed, Olga Millers MD [facility]

## 2012-06-18 NOTE — Progress Notes (Signed)
[facility] PROGRESS NOTE  Juan Wolf JWJ:[id number] DOB: [birth year] DOA: 06/07/2012 PCP: Ignatius Specking., MD  Assessment/Plan: Acute on Chronic diastolic CHF , severe AS -neg [id number] for admission -weights with wide variations since admission--suspect inaccurate  -renal function improving - po furosemide per cardiology--appreciate followup  -EF 50% --11/22 echo Focus is comfort from now  Depression/Suicidal ideation  -Increase sertraline to 50 mg daily starting 06/18/2012 -D/c Ambien as this may contribute to depression/emotional lability  -Still with accusations of frustration, no plan- psych following   Acute on chronic renal failure  -stable around 1.7 now  Acute hepatitis / transaminitis / coagulopathy  - passive hepatic congestion due to severe R heart failure  -exacerbation related to hypoperfusion due to volume depletion  -improving   Pulm HTN / R Heart failure/ Cor Pulmonale  RV severely dilated and hypokinetic via echo 11/22  11/22 TTE >> mod LVH, LVEF 50%, RV dilation, RA E, LAE, mod-severe AS, mild AI, IVC dilation, mod to severe RV dysfunction   Citrobacter UTI with Suprapubic discomfort/Bacteruria -stopped abx  Lethargy/altered mental status  More lucid as the hospitalization has progressed  Hypokalemia -Replete -Check magnesium  Constipation: on dulcolax and senokot, add miralax   Family Communication:   Son, [name] Disposition Plan:   Now decision made for residential hospice    Procedures/Studies: US Renal  06/08/2012  *RADIOLOGY REPORT*  Clinical Data: Acute renal failure.  RENAL/URINARY TRACT ULTRASOUND COMPLETE  Comparison:  Previous abdomen ultrasound.  Findings:  Right Kidney:  Normal, measuring 11.2 cm in length.  No hydronephrosis.  Left Kidney:  Normal, measuring 10.1 cm in length.  No hydronephrosis.  Bladder:  Empty, with a Foley catheter in place.  IMPRESSION: Normal examination.   Original Report Authenticated By: Beckie Salts,  M.D.    Dg Chest Port 1 View  06/09/2012  *RADIOLOGY REPORT*  Clinical Data: Cough and congestion.  PORTABLE CHEST - 1 VIEW  Comparison: 06/07/2012.  Findings: The heart remains enlarged.  There is persistent central vascular congestion, mild interstitial pulmonary edema, bilateral pleural effusions and bibasilar atelectasis.  Overall slight improved aeration may suggest resolving edema.  IMPRESSION: Persistent but slightly improved CHF.   Original Report Authenticated By: Rudie Meyer, M.D.    Dg Chest Port 1 View  06/07/2012  *RADIOLOGY REPORT*  Clinical Data: Congestive heart failure.  PORTABLE CHEST - 1 VIEW  Comparison: Chest 06/04/2012.  Findings: Bilateral pleural effusions and basilar airspace disease persist.  The patient's right effusion has worsened.  There is cardiomegaly and interstitial edema.  IMPRESSION: Increased interstitial edema.  Bilateral pleural effusions persist and the right effusion is larger than on the prior study.   Original Report Authenticated By: Holley Dexter, M.D.          Subjective: " I dont feel too great" , no Bm yet  Objective: Filed Vitals:   06/17/12 2055 06/18/12 0418 06/18/12 1036 06/18/12 1300  BP: 132/67 145/75 139/78 134/68  Pulse: 81 93 63 66  Temp: 97.1 F (36.2 C) 97.2 F (36.2 C) 97.3 F (36.3 C) 97.3 F (36.3 C)  TempSrc: Oral Oral Oral Oral  Resp: 20 20 20 20   Height:      Weight:  96.2 kg (212 lb 1.3 oz)    SpO2: 99% 95% 100% 100%    Intake/Output Summary (Last 24 hours) at 06/18/12 1410 Last data filed at 06/18/12 1300  Gross per 24 hour  Intake    843 ml  Output   1325 ml  Net   -  482 ml   Weight change: -0.3 kg (-10.6 oz) Exam:   General:  Pt is alert, follows commands appropriately, not in acute distress  HEENT: No icterus, No thrush,  [place]/AT  Cardiovascular: IRRR,  no rubs, no gallops  Respiratory: Bibasilar crackles. No wheezes or rhonchi. Good air movement.  Abdomen: Soft/+BS, non tender, non  distended, no guarding, scrotal swelling  Extremities: 2+ edema, No lymphangitis, No petechiae, No rashes, no synovitis  Data Reviewed: Basic Metabolic Panel:  Lab 06/17/12 7829 06/16/12 0608 06/15/12 0912 06/14/12 0521 06/13/12 0530 06/12/12 0500  NA 139 138 139 138 139 --  K 3.8 2.9* 3.0* 3.8 3.7 --  CL 98 97 97 99 100 --  CO2 29 29 29 24 26  --  GLUCOSE 115* 119* 173* 104* 102* --  BUN 57* 61* 64* 69* 75* --  CREATININE 1.77* 1.76* 1.92* 2.06* 2.31* --  CALCIUM 9.2 9.0 8.7 8.6 9.0 --  MG 2.3 -- -- -- -- 2.4  PHOS -- -- -- -- -- --   Liver Function Tests:  Lab 06/17/12 0510 06/16/12 0608 06/15/12 0912 06/14/12 0521 06/13/12 0530  AST 35 37 49* 73* 101*  ALT 129* 145* 184* 246* 334*  ALKPHOS 121* 118* 115 120* 125*  BILITOT 1.5* 1.4* 1.4* 1.5* 1.4*  PROT 6.9 6.5 6.5 6.4 6.6  ALBUMIN 2.7* 2.5* 2.6* 2.6* 2.7*   No results found for this basename: LIPASE:5,AMYLASE:5 in the last 168 hours No results found for this basename: AMMONIA:5 in the last 168 hours CBC: No results found for this basename: WBC:5,NEUTROABS:5,HGB:5,HCT:5,MCV:5,PLT:5 in the last 168 hours Cardiac Enzymes: No results found for this basename: CKTOTAL:5,CKMB:5,CKMBINDEX:5,TROPONINI:5 in the last 168 hours BNP: No components found with this basename: POCBNP:5 CBG: No results found for this basename: GLUCAP:5 in the last 168 hours  Recent Results (from the past 240 hour(s))  URINE CULTURE     Status: Normal   Collection Time   06/13/12  2:23 PM      Component Value Range Status Comment   Specimen Description URINE, RANDOM   Final    Special Requests NONE   Final    Culture  Setup Time 06/13/2012 18:20   Final    Colony Count >=100,000 COLONIES/ML   Final    Culture CITROBACTER KOSERI   Final    Report Status 06/15/2012 FINAL   Final    Organism ID, Bacteria CITROBACTER KOSERI   Final      Scheduled Meds:    . docusate sodium  100 mg Oral BID  . feeding supplement  1 Container Oral TID BM  .  furosemide  80 mg Oral Daily  . magic mouthwash  10 mL Oral TID  . polyethylene glycol  17 g Oral BID  . [COMPLETED] senna-docusate  2 tablet Oral Once  . sertraline  25 mg Oral Daily  . sodium chloride  3 mL Intravenous Q12H  . [DISCONTINUED] ciprofloxacin  250 mg Oral BID  . [DISCONTINUED] gabapentin  200 mg Oral QHS  . [DISCONTINUED] metoprolol tartrate  12.5 mg Oral BID  . [DISCONTINUED] potassium chloride  40 mEq Oral Daily   Continuous Infusions:    . sodium chloride 10 mL/hr at 06/09/12 0800     Juan Ismael, MD  [facility] Pager [phone]  If 7PM-7AM, please contact night-coverage [email or web address] Password [email or web address] 06/18/2012, 2:10 PM   LOS: 11 days

## 2012-06-18 NOTE — Progress Notes (Signed)
Patient Juan Wolf      DOB: 1922-06-29      WUJ:[id number]  Checked in on patient having intermittent nausea, controlled with Zofran as needed. CSW communicating with [facility], awaiting bed offer.  Freddie Breech, CNS-C Palliative Medicine Team [facility] Team Phone: [phone] Pager: [phone]

## 2012-06-19 MED ORDER — SERTRALINE HCL 25 MG PO TABS: 25.0000 mg | ORAL_TABLET | Freq: Every day | ORAL | Status: AC

## 2012-06-19 MED ORDER — ZOLPIDEM TARTRATE 10 MG PO TABS: 5.0000 mg | ORAL_TABLET | Freq: Every evening | ORAL | Status: AC | PRN

## 2012-06-19 MED ORDER — ONDANSETRON HCL 4 MG PO TABS: 4.0000 mg | ORAL_TABLET | ORAL | Status: AC | PRN

## 2012-06-19 MED ORDER — POLYETHYLENE GLYCOL 3350 17 G PO PACK: 17.0000 g | PACK | Freq: Two times a day (BID) | ORAL | Status: AC | PRN

## 2012-06-19 MED ORDER — FUROSEMIDE 80 MG PO TABS: 80.0000 mg | ORAL_TABLET | Freq: Every day | ORAL | Status: AC

## 2012-06-19 MED ORDER — MORPHINE SULFATE (CONCENTRATE) 20 MG/ML PO SOLN: 5.0000 mg | ORAL | Status: AC | PRN

## 2012-06-19 MED ORDER — LORAZEPAM 1 MG PO TABS: 1.0000 mg | ORAL_TABLET | Freq: Four times a day (QID) | ORAL | Status: AC | PRN

## 2012-06-19 NOTE — Discharge Summary (Signed)
Physician Discharge Summary  Patient ID: CHANZE TEAGLE MRN: [id number] DOB/AGE: 1922/02/12 78 y.o.  Admit date: 06/07/2012 Discharge date: 06/19/2012  Primary Care Physician:  Ignatius Specking., MD  Discharged to residential hospice   Discharge Diagnoses:    Active Problems:  HYPERTENSION  COPD  CAD (coronary artery disease)  Aortic stenosis  Acute on chronic diastolic CHF (congestive heart failure), NYHA class 1  Ventricular tachycardia  Renal insufficiency  Anemia  Atrial fibrillation  Syncope  Hypotension  Acute on chronic renal failure  Shock liver  Right heart failure  Back pain  Major depression  Hypokalemia  Adult failure to thrive  Dyspnea  Anxiety      Medication List     As of 06/19/2012 10:21 AM    STOP taking these medications         hydrochlorothiazide 12.5 MG capsule   Commonly known as: MICROZIDE      metoprolol 50 MG tablet   Commonly known as: LOPRESSOR      TAKE these medications         fluticasone 50 MCG/ACT nasal spray   Commonly known as: FLONASE   Place 2 sprays into the nose daily as needed. For allergies      furosemide 80 MG tablet   Commonly known as: LASIX   Take 1 tablet (80 mg total) by mouth daily.      gabapentin 100 MG capsule   Commonly known as: NEURONTIN   Take 200 mg by mouth at bedtime.      LORazepam 1 MG tablet   Commonly known as: ATIVAN   Take 1 tablet (1 mg total) by mouth every 6 (six) hours as needed for anxiety.      morphine 20 MG/ML concentrated solution   Commonly known as: ROXANOL   Take 0.25 mLs (5 mg total) by mouth every 2 (two) hours as needed.      nitroGLYCERIN 0.4 MG SL tablet   Commonly known as: NITROSTAT   Place 1 tablet (0.4 mg total) under the tongue every 5 (five) minutes as needed. For chest pain. CALL YOUR DOCTOR BEFORE USING.      ondansetron 4 MG tablet   Commonly known as: ZOFRAN   Take 1 tablet (4 mg total) by mouth every 4 (four) hours as needed for nausea.     polyethylene glycol packet   Commonly known as: MIRALAX / GLYCOLAX   Take 17 g by mouth 2 (two) times daily as needed.      potassium chloride SA 20 MEQ tablet   Commonly known as: K-DUR,KLOR-CON   Take 20 mEq by mouth 2 (two) times daily.      sertraline 25 MG tablet   Commonly known as: ZOLOFT   Take 1 tablet (25 mg total) by mouth daily.      zolpidem 10 MG tablet   Commonly known as: AMBIEN   Take 0.5 tablets (5 mg total) by mouth at bedtime as needed. sleep         Disposition and Follow-up:  Hospice  Consults:   [facility] Palliative medicine Psychiatry   Brief H and P: Patient is a 78 year old Caucasian male with a past medical history of coronary artery disease status post CABG and subsequent PCI , history of atrial fibrillation not on anticoagulation secondary to gastrointestinal bleeding, moderate to severe aortic stenosis, diastolic heart failure who has been transferred from [facility] evaluation of worsening acute on chronic renal failure and worsening transaminitis. Patient was admitted  to [facility] on 06/04/2012 for shortness of breath, while in the waiting area of the hospital he had a syncopal episode. He was then brought to the emergency room, where he was found to be hypotensive and as a result he was given IV fluids. He was subsequently admitted, he was thought to have acute on chronic diastolic heart failure and started on gentle diuresis. Unfortunately during his hospital course, his renal function continued to worsen, his liver enzymes continued to worsen as well. Family claims that all of his diuretics were stopped yesterday and patient was then started on intravenous fluids. Because of ongoing worsening of his renal and liver function, patient was transferred to [facility] intensive care unit, he was evaluated by PCCM and subsequently downgraded and tried hospitalist was asked to admit this patient.  Unfortunately just prior to  the patient's transfer from [facility], and he claims he was given morphine and a benzodiazepine, as a result during my evaluation, patient is drowsy and slightly lethargic and not able to participate in the history taking process. Patient's son and daughter-in-law at the bedside, they claim that the patient has been vomiting almost on a daily basis since admission    Hospital Course:  Acute on Chronic diastolic CHF , severe AS  -neg 1300cc for admission  -weights with wide variations since admission--suspect inaccurate  -renal function improving  - po furosemide per cardiology--appreciate followup  -EF 50% --11/22 echo ,  had a palliative care meeting for goals of care and decision made for comfort as focus from now   Depression/Suicidal ideation  -Increase sertraline to 50 mg daily starting 06/18/2012  -D/c Ambien as this may contribute to depression/emotional lability, did express frustration and said that he wanted it all to end. -Still with accusations of frustration, no plan- psych following, sitter discontinued 48 hours ago.  Acute on chronic renal failure  -stable around 1.7 now   Acute hepatitis / transaminitis / coagulopathy  - passive hepatic congestion due to severe R heart failure  -exacerbation related to hypoperfusion due to volume depletion  -improving   Pulm HTN / R Heart failure/ Cor Pulmonale / Moderate to severe AS RV severely dilated and hypokinetic via echo 11/22  11/22 TTE >> mod LVH, LVEF 50%, RV dilation, RA E, LAE, mod-severe AS, mild AI, IVC dilation, mod to severe RV dysfunction   Citrobacter UTI with Suprapubic discomfort/Bacteruria  -stopped abx   Constipation: resolved with bowel regimen  Family Communication: Son, Optometrist  Disposition Plan: Decision made for residential hospice      Time spent on Discharge:  Signed: Xandria Gallaga [facility] 06/19/2012, 10:21 AM

## 2012-06-19 NOTE — Progress Notes (Signed)
   Patient Name: Juan Wolf Date of Encounter: 06/19/2012      SUBJECTIVE Patient complains of dyspnea; no chest pain; "sleepy"  CURRENT MEDS    . docusate sodium  100 mg Oral BID  . feeding supplement  1 Container Oral TID BM  . furosemide  80 mg Oral Daily  . magic mouthwash  10 mL Oral TID  . polyethylene glycol  17 g Oral BID  . sertraline  25 mg Oral Daily  . sodium chloride  3 mL Intravenous Q12H   OBJECTIVE  Filed Vitals:   06/18/12 1036 06/18/12 1300 06/18/12 2100 06/19/12 0437  BP: 139/78 134/68 152/71 118/79  Pulse: 63 66 67 111  Temp: 97.3 F (36.3 C) 97.3 F (36.3 C) 97.3 F (36.3 C) 97 F (36.1 C)  TempSrc: Oral Oral Oral Axillary  Resp: 20 20 18 18   Height:      Weight:    207 lb 10.8 oz (94.2 kg)  SpO2: 100% 100% 100% 100%    Intake/Output Summary (Last 24 hours) at 06/19/12 0834 Last data filed at 06/19/12 0138  Gross per 24 hour  Intake    663 ml  Output    875 ml  Net   -212 ml   Filed Weights   06/17/12 0533 06/18/12 0418 06/19/12 0437  Weight: 212 lb 11.9 oz (96.5 kg) 212 lb 1.3 oz (96.2 kg) 207 lb 10.8 oz (94.2 kg)   PHYSICAL EXAM  General: NAD. Chronically ill Neuro: Alert and oriented X 3.  HEENT:  Normal  Neck: Supple  Lungs:  Resp regular and unlabored, diminished breath sounds @ bilat bases. Heart: IR, IR, 2/6 SEM @ rusb - heard throughout. Abdomen: Soft, non-tender, non-distended, hernia noted Extremities:  2+ bilat LE edema to knees.   Accessory Clinical Findings  Basic Metabolic Panel  Basename 06/17/12 0510  NA 139  K 3.8  CL 98  CO2 29  GLUCOSE 115*  BUN 57*  CREATININE 1.77*  CALCIUM 9.2  MG 2.3  PHOS --   Liver Function Tests  Basename 06/17/12 0510  AST 35  ALT 129*  ALKPHOS 121*  BILITOT 1.5*  PROT 6.9  ALBUMIN 2.7*    ASSESSMENT AND PLAN  1.  Acute on chronic diastolic CHF with Moderately Severe AS:  Continue lasix 80mg  po daily as previously outlined with goal of keeping him on the wet  side given narrow euvolemic window and propensity for hypotension resulting in syncope.  HR/BP reasonably controlled.  2.  Moderately Severe AS:  Conservative mgmt.  Not felt to be a candidate for open replacement or TAVR.  3.  CAD:  Cont medical therapy.  4.  CKD Stage III:  Creat improving  5.  Major Depression:  Per psych.  6.  Syncope:  No recurrence.  Avoid hypotn as above. Plan is for hospice; we will sign off; please call with questions Signed, Olga Millers MD [facility]

## 2012-06-19 NOTE — Progress Notes (Signed)
Report given to nurse Clydie Braun at facility, awaiting for EMS to transport patient.  Lorretta Harp RN

## 2012-06-19 NOTE — Progress Notes (Signed)
Patient was assessed and accepted to [facility] today. OK per MD for d/c to facility today via EMS. Notified pt's nurse Denequal and patient's daughter-in-law.  Patient is pleased with d/c plan to [facility]ospice [facility].  No further CSW needs identified.  Lorri Frederick. [place]  [phone]

## 2012-06-19 NOTE — Progress Notes (Signed)
Physical Therapy Treatment Patient Details Name: Juan Wolf MRN: [id number] DOB: Oct 18, 1921 Today's Date: 06/19/2012 Time: 4098-1191 PT Time Calculation (min): 27 min  PT Assessment / Plan / Recommendation Comments on Treatment Session  Pt. admitted with aortic stenosis, CHF, and shock liver; Pt. continues to have decreased participation although he states he wants to try to sit up in the chair. Only able to sit EOB for 1 minute due to dizziness and nausea. Once returned to supine pt. did vomit a small amount. Pt. requires encouragement to move extremities and stated several times "this is the sickest I've ever been".    Follow Up Recommendations  SNF;Supervision/Assistance - 24 hour                 Frequency Min 3X/week   Plan Discharge plan remains appropriate;Frequency remains appropriate    Precautions / Restrictions Precautions Precautions: Fall Restrictions Weight Bearing Restrictions: No   Pertinent Vitals/Pain No pain reported    Mobility  Bed Mobility Rolling Left: 2: Max assist Left Sidelying to Sit: HOB elevated;1: +1 Total assist (20 degrees) Sit to Supine: 2: Max assist Scooting to Juan Wolf: 4: Min assist Details for Bed Mobility Assistance: Pt. required verbal and tactile cueing for hand placement and sequencing. Required physical assist for LE managment and at trunk to achieve upright. Pt. able to assist with scooting HOB by pulling with UEs using rails and pushing with LEs when cued.  Transfers Transfers: Not assessed Ambulation/Gait Ambulation/Gait Assistance: Not tested (comment)    Exercises General Exercises - Upper Extremity Shoulder Flexion: AAROM;Both;5 reps;Supine General Exercises - Lower Extremity Ankle Circles/Pumps: AROM;Both;Supine;10 reps (not full ROM) Heel Slides: AAROM;Both;10 reps;Supine     PT Goals Acute Rehab PT Goals PT Goal: Rolling Supine to Left Side - Progress: Progressing toward goal PT Goal: Supine/Side to Sit - Progress:  Progressing toward goal PT Goal: Sit at Edge Of Bed - Progress: Not progressing PT Goal: Sit to Supine/Side - Progress: Progressing toward goal  Visit Information  Last PT Received On: 06/19/12 Assistance Needed: +2    Subjective Data  Subjective: "Just leave me alone to die."   Cognition  Overall Cognitive Status: Impaired Area of Impairment: Following commands;Attention Arousal/Alertness: Lethargic Orientation Level: Disoriented to;Time;Place Behavior During Session: Lethargic Current Attention Level: Sustained Following Commands: Follows one step commands inconsistently    Balance  Static Sitting Balance Static Sitting - Balance Support: Bilateral upper extremity supported;Feet supported Static Sitting - Level of Assistance: 1: +2 Total assist Static Sitting - Comment/# of Minutes: 1 min EOB; pt. complaining of dizziness. Pt. with posterior lean in sitting. Flexed posture and pt. unable to lift head when cued.   End of Session PT - End of Session Equipment Utilized During Treatment: Oxygen Activity Tolerance: Patient limited by fatigue Patient left: in bed;with call bell/phone within reach Nurse Communication: Mobility status     [name] SPT 06/19/2012, 8:23 AM

## 2012-06-19 NOTE — Progress Notes (Signed)
Seen and agree with SPT note. Pt with increased time between all transfers and movements per pt request. Delaney Meigs, PT [phone]

## 2012-06-19 NOTE — Progress Notes (Signed)
Patient is being discharge to [facility]; facility requests that foley catheter and IV remain in patient.  Patient is unable to sign discharge instructions but instructions will be sent to facility with patient.  Lorretta Harp RN

## 2012-07-02 ENCOUNTER — Ambulatory Visit: Payer: Medicare Other | Admitting: Cardiovascular Disease

## 2012-07-15 DEATH — deceased

## 2013-10-19 ENCOUNTER — Other Ambulatory Visit: Payer: Self-pay

## 2014-01-18 IMAGING — CR DG CHEST 2V
2 series · 2 of 2 positions shown · non-contrast
Comparison: 06/19/2011

CLINICAL DATA: Shortness of breath on exertion.  History of
hypertension, COPD, CAD, eighth rib, MI, CABG and stents.  History
of smoking.

CHEST - 2 VIEW

[w chest pa]
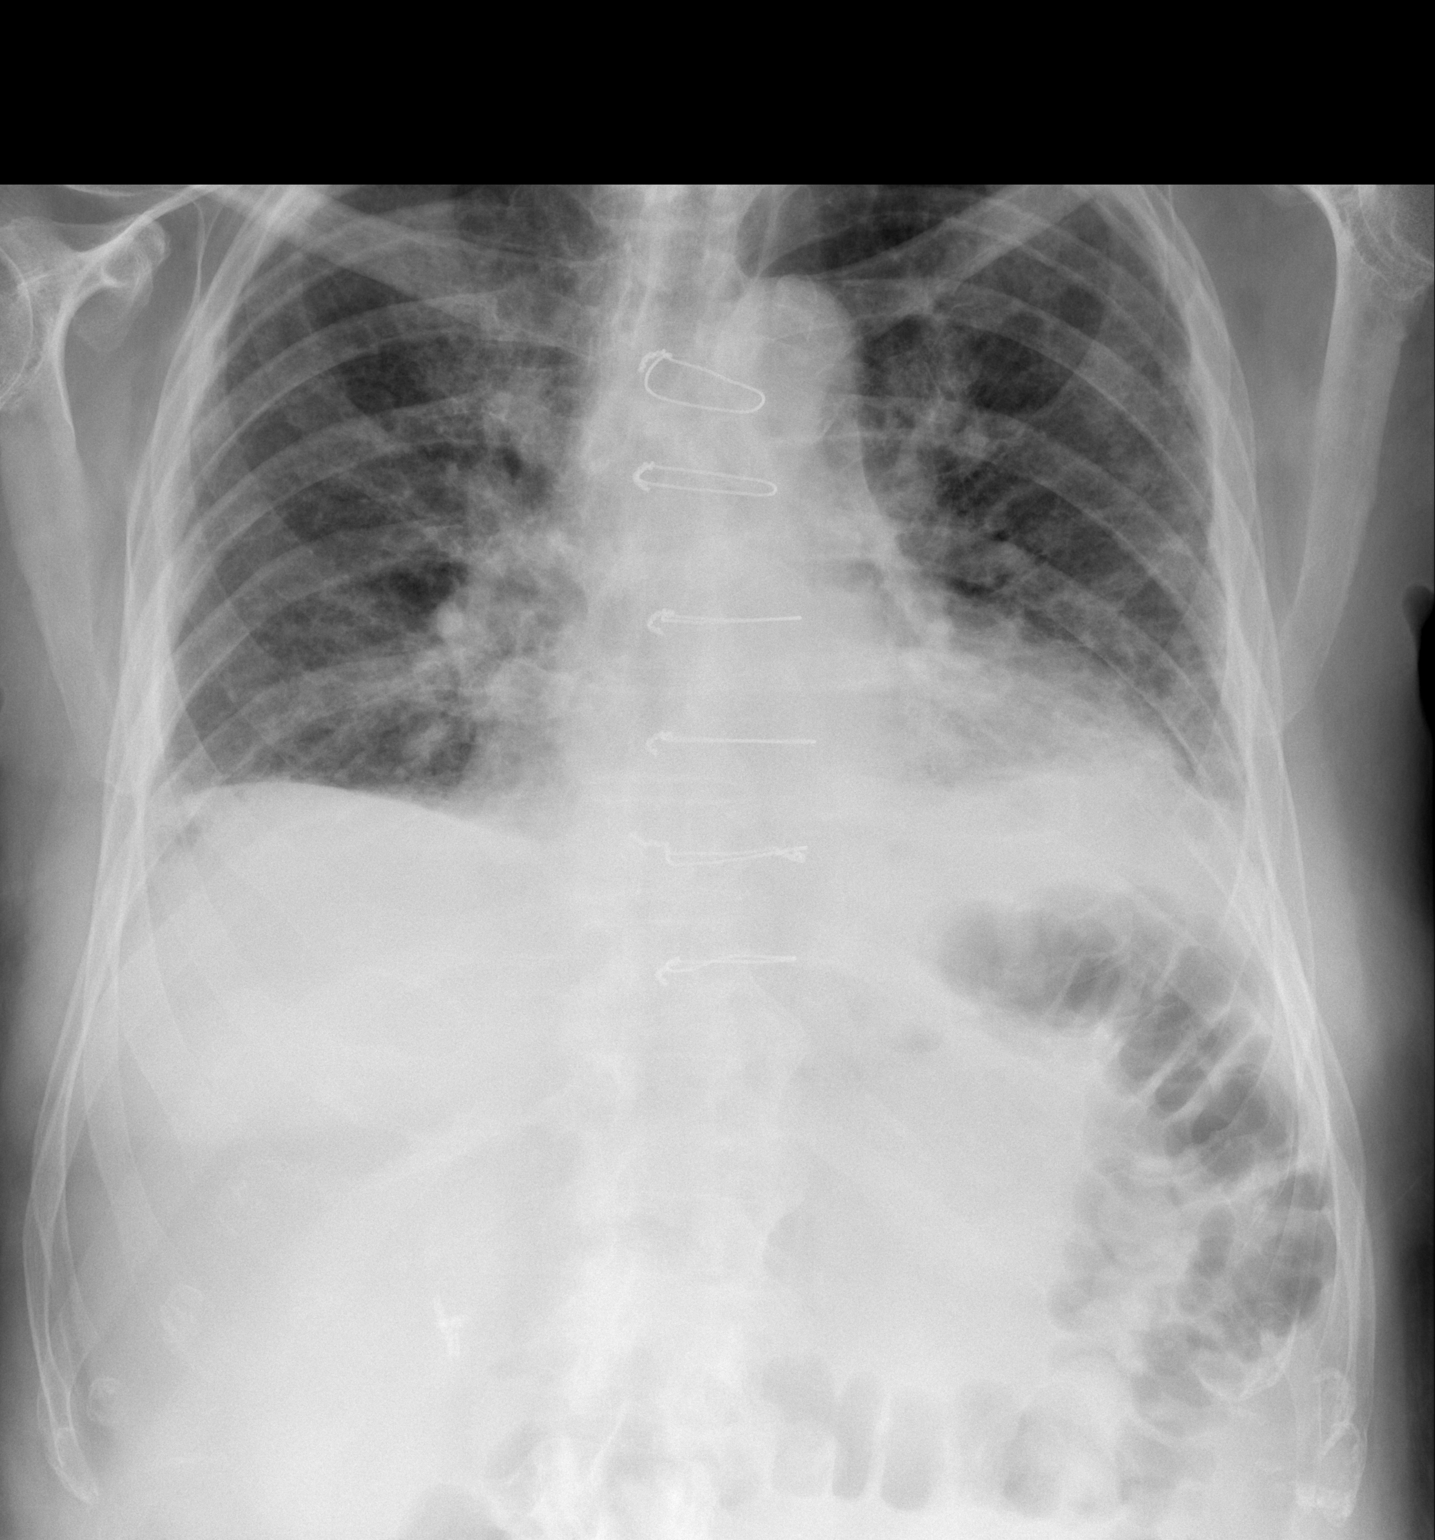

[w chest lat]
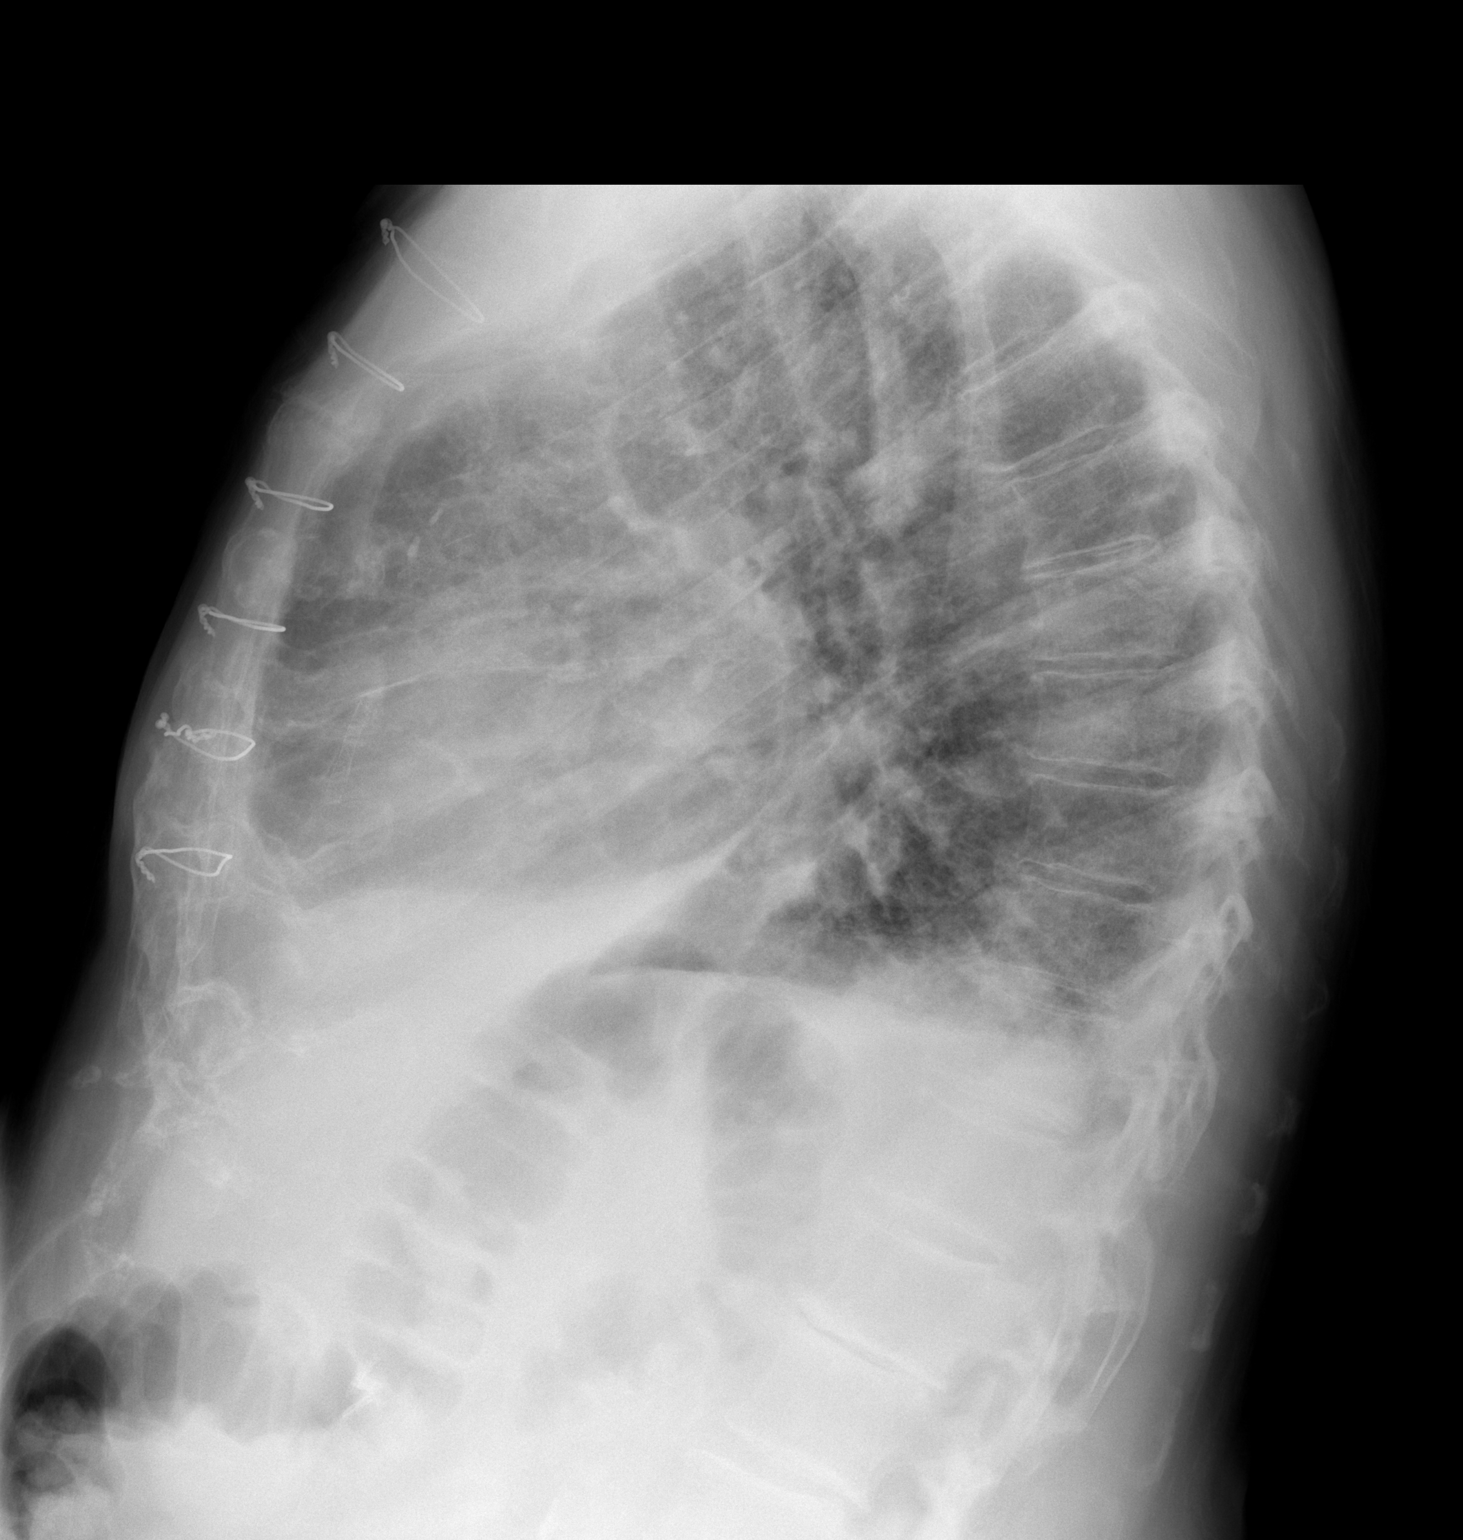

[2 of 2 positions shown; findings below may reference images not displayed]

FINDINGS: The patient has had median sternotomy.  Heart is
enlarged.  There is prominence of interstitial markings which
appears stable.  No focal consolidations are identified.  There is
posterior costophrenic angle blunting.  Surgical clips are
identified in the right upper quadrant.
IMPRESSION: 1.  Cardiomegaly
2. Suspect chronic, interstitial lung disease.

## 2014-01-19 ENCOUNTER — Telehealth: Payer: Self-pay

## 2014-01-19 NOTE — Telephone Encounter (Signed)
Patient died @ [facility] per Obituary °

## 2014-06-22 ENCOUNTER — Encounter (HOSPITAL_COMMUNITY): Payer: Self-pay | Admitting: Cardiovascular Disease

## 2014-06-23 ENCOUNTER — Encounter (HOSPITAL_COMMUNITY): Payer: Self-pay | Admitting: Cardiovascular Disease

## 2014-10-18 IMAGING — US US RENAL
1 series · 14 of 25 positions shown · non-contrast
Comparison: Previous abdomen ultrasound.

CLINICAL DATA: Acute renal failure.

RENAL/URINARY TRACT ULTRASOUND COMPLETE

[Series 1: us renal · 0.33mm/px · 14 of 36 slices shown]
[im 1/36]
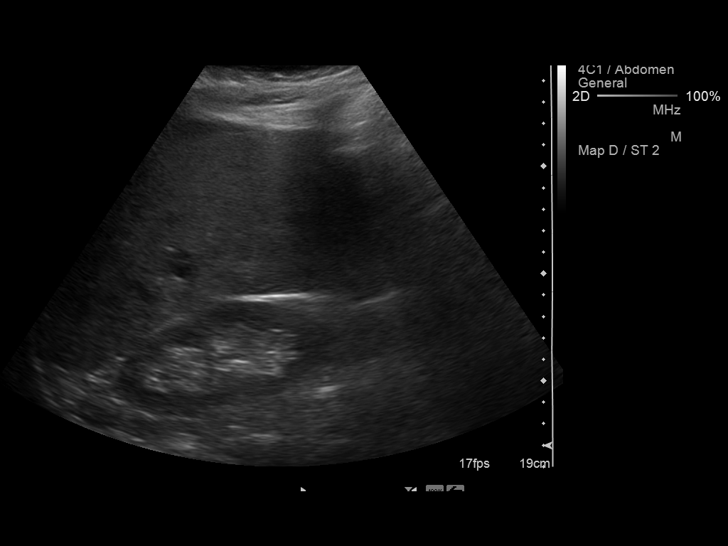
[im 3/36]
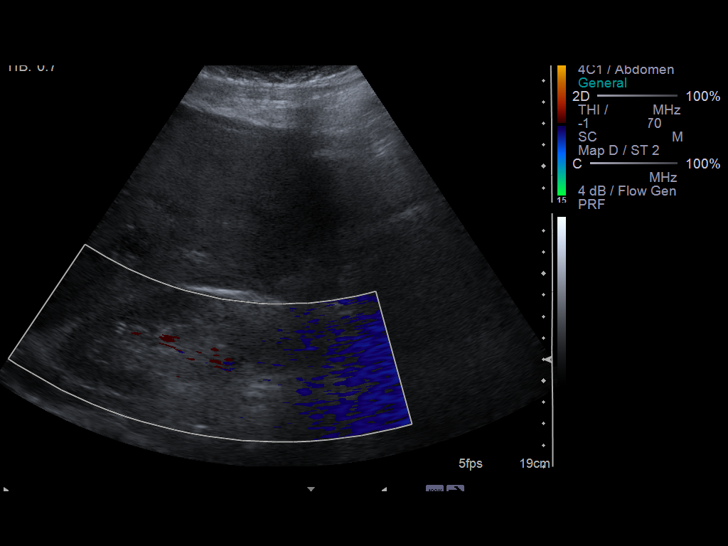
[im 6/36]
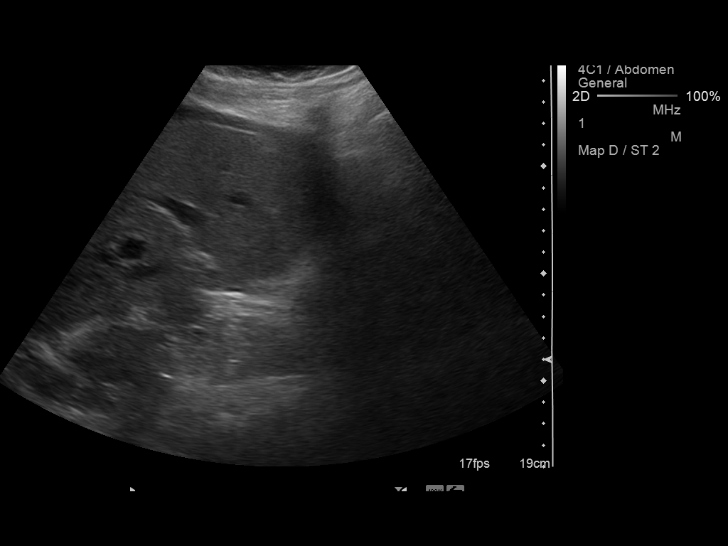
[im 9/36]
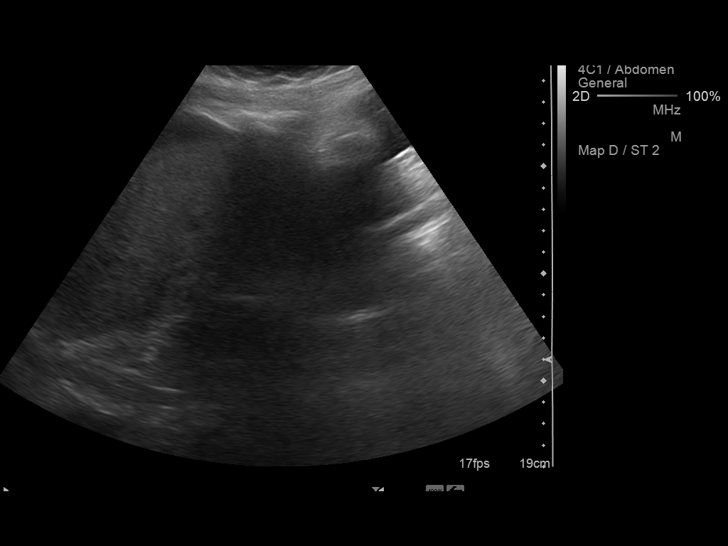
[im 12/36]
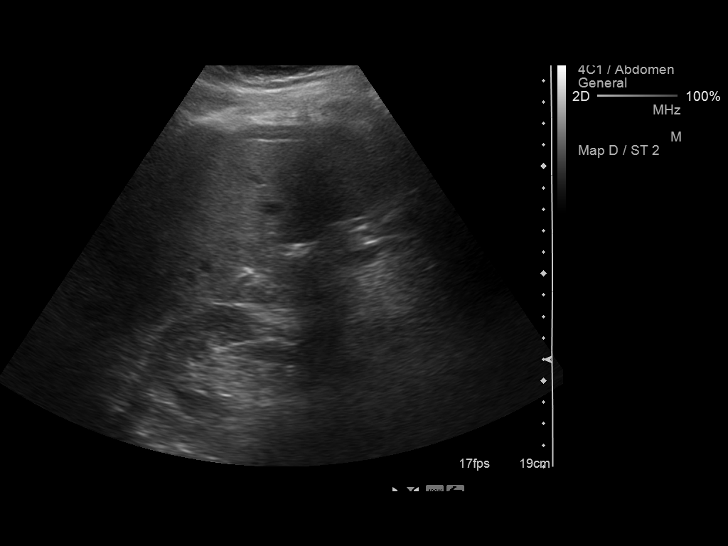
[im 14/36]
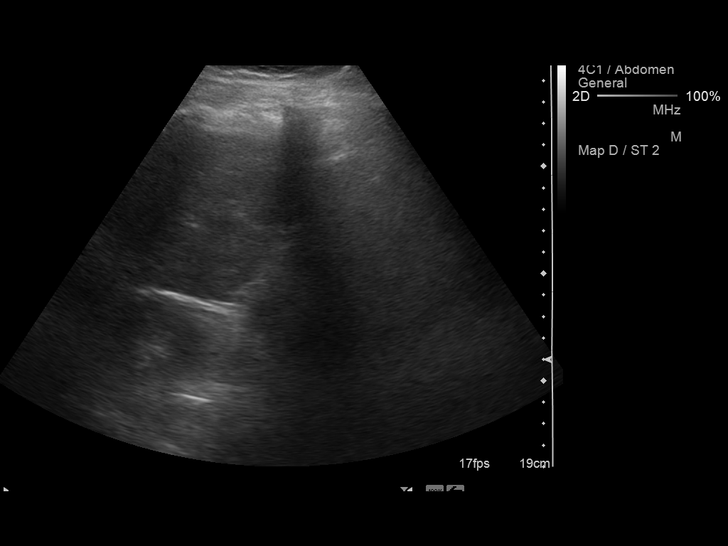
[im 17/36]
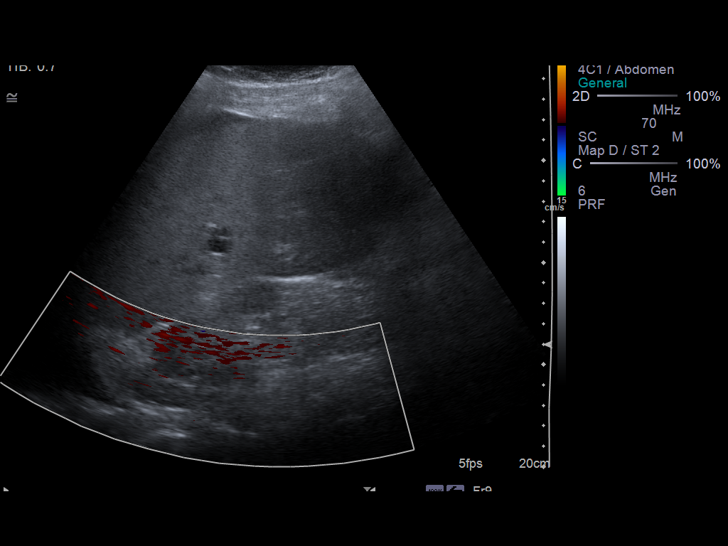
[im 19/36]
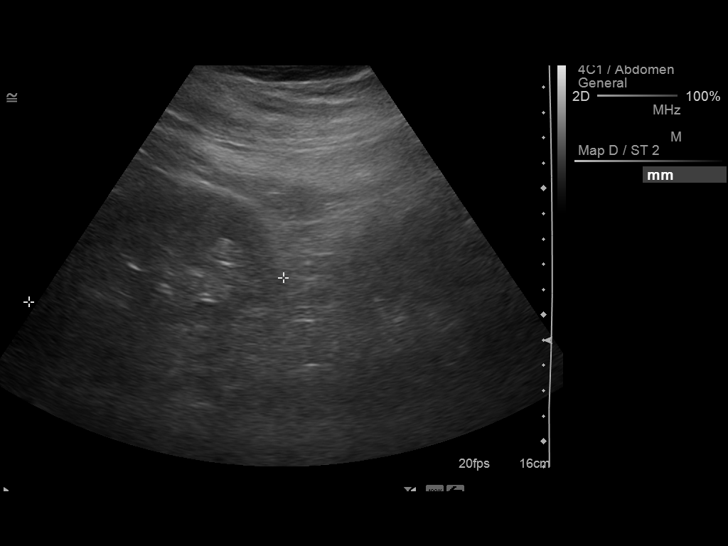
[im 22/36]
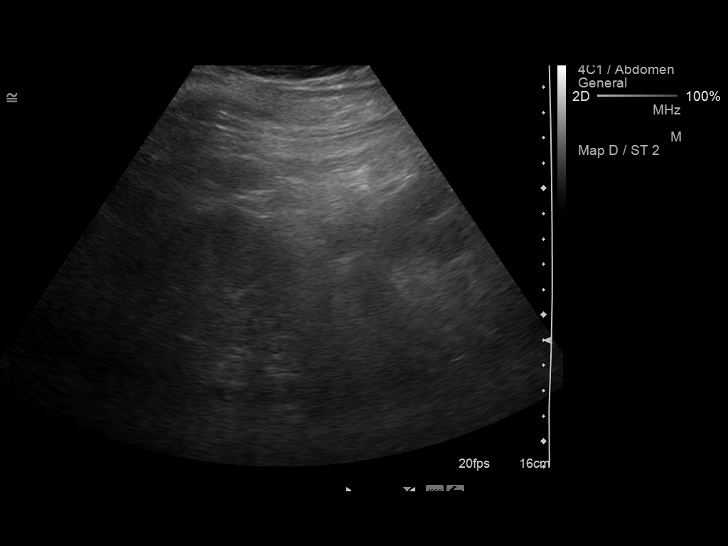
[im 24/36]
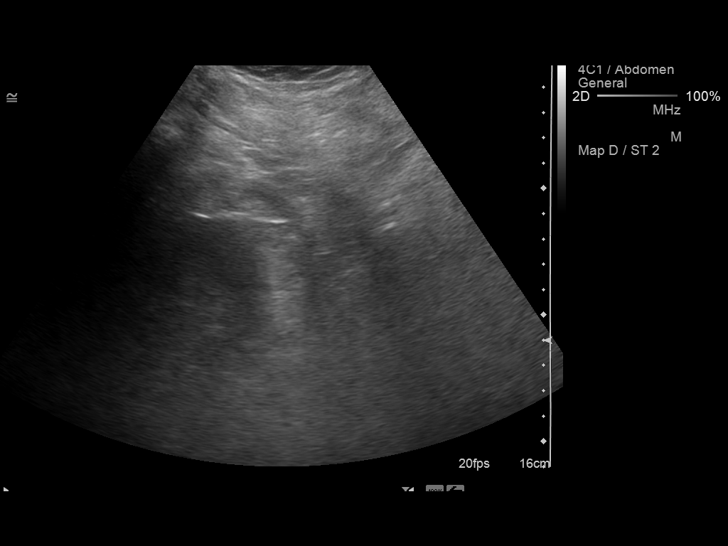
[im 27/36]
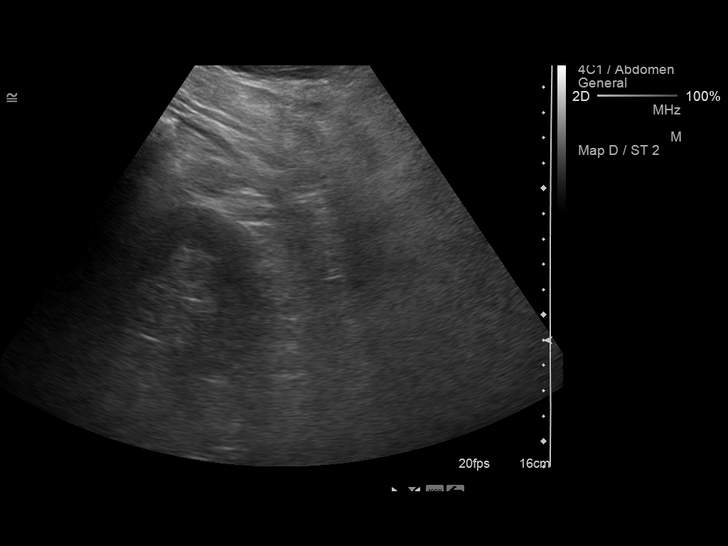
[im 30/36]
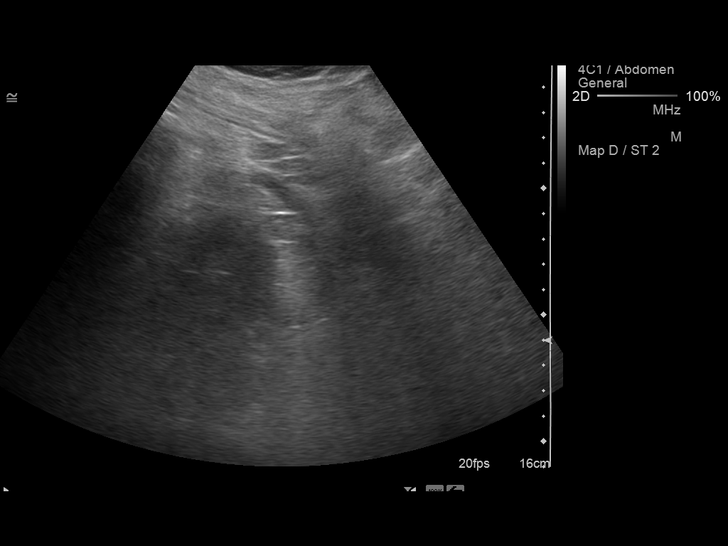
[im 33/36]
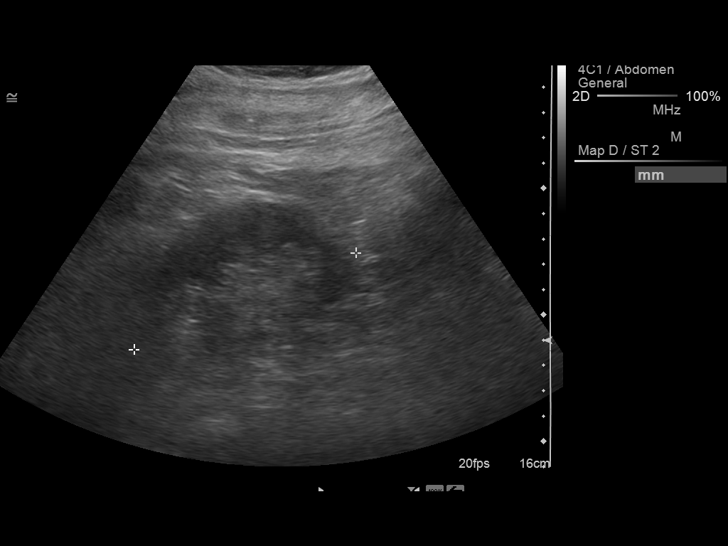
[im 36/36]
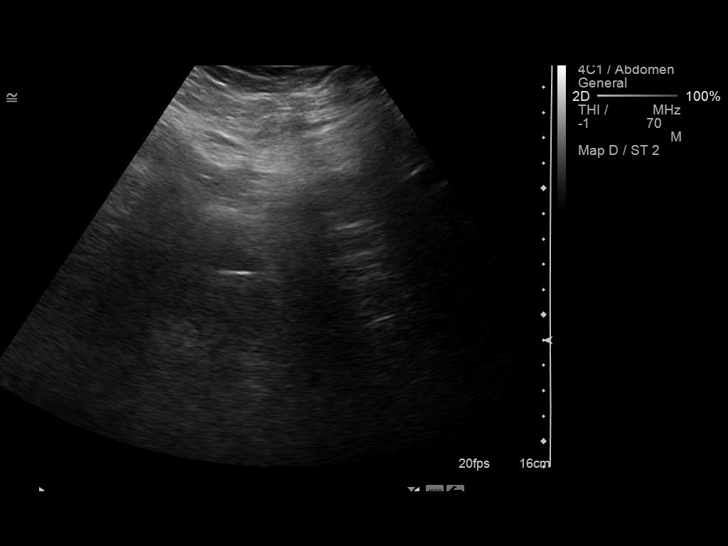

[14 of 25 positions shown; findings below may reference images not displayed]

FINDINGS: Right Kidney:  Normal, measuring 11.2 cm in length.  No
hydronephrosis.

Left Kidney:  Normal, measuring 10.1 cm in length.  No
hydronephrosis.

Bladder:  Empty, with a Foley catheter in place.
IMPRESSION: Normal examination.

## 2014-10-19 IMAGING — CR DG CHEST 1V PORT
1 series · 1 of 1 positions shown · non-contrast
Comparison: 06/07/2012.

CLINICAL DATA: Cough and congestion.

PORTABLE CHEST - 1 VIEW

[AP]
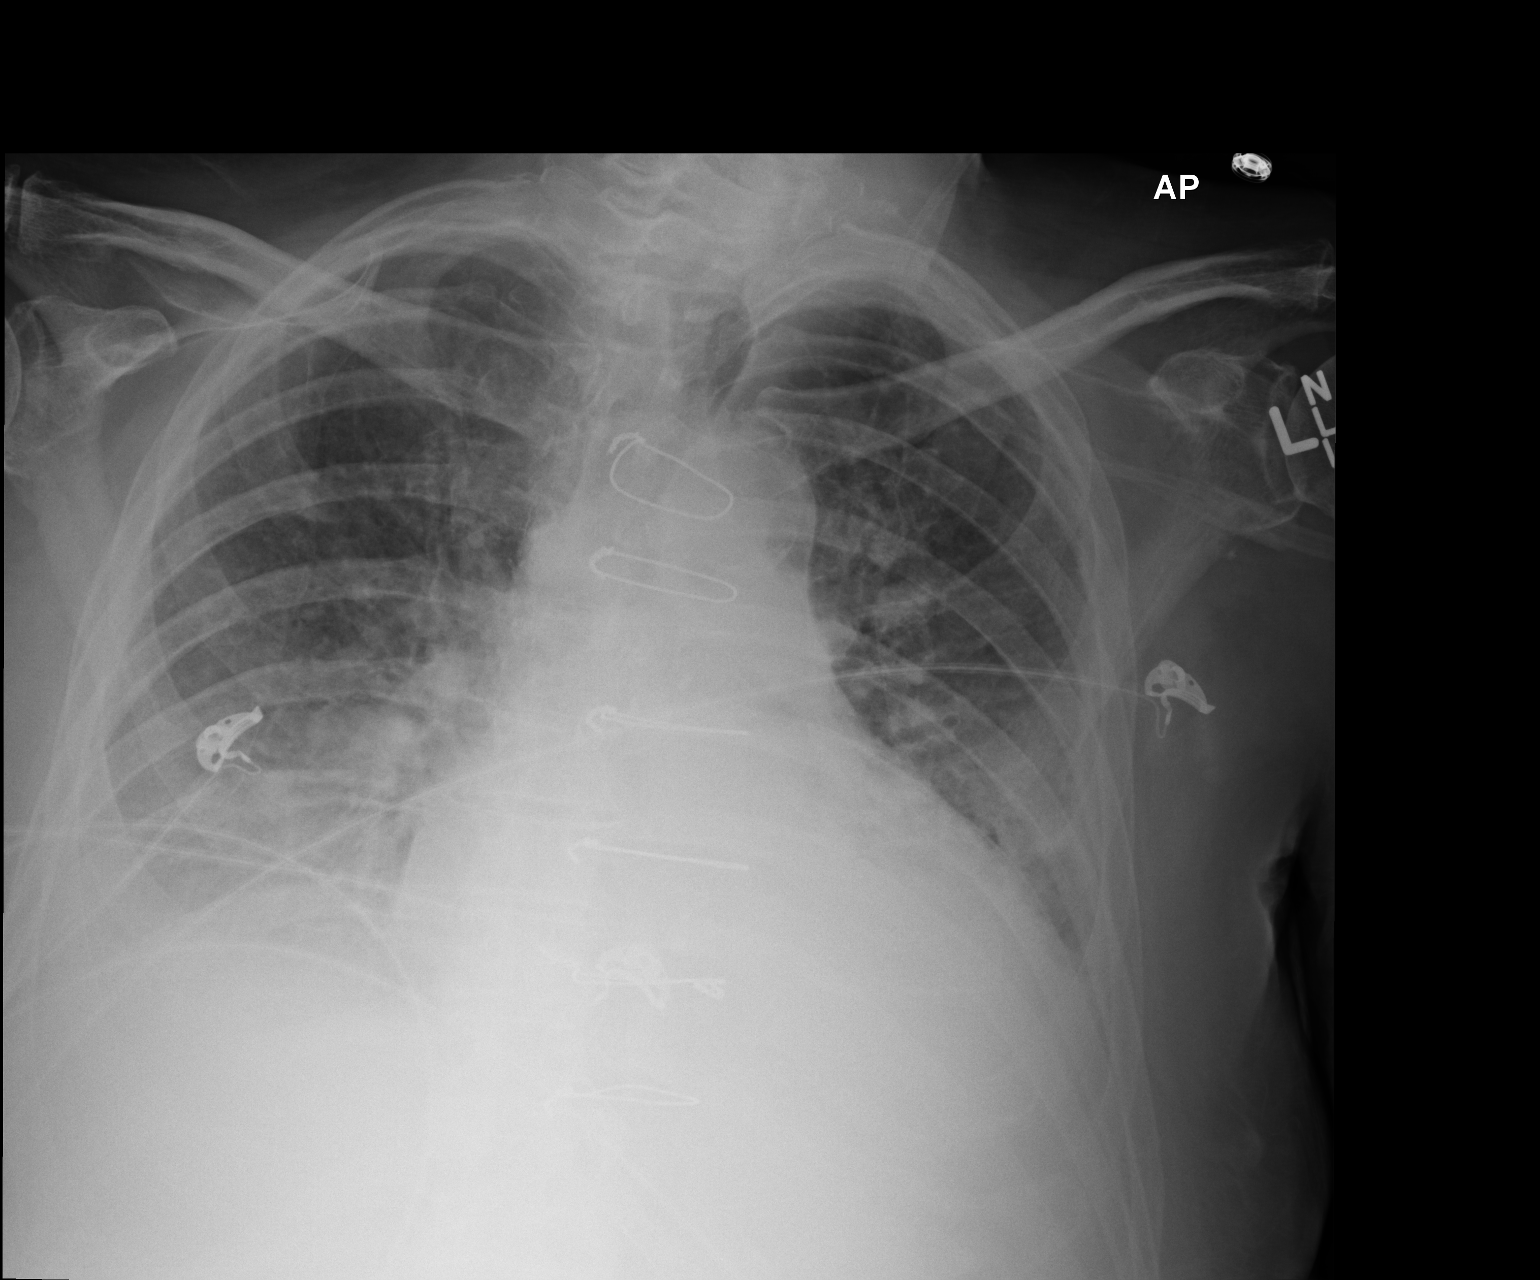

[1 of 1 positions shown; findings below may reference images not displayed]

FINDINGS: The heart remains enlarged.  There is persistent central
vascular congestion, mild interstitial pulmonary edema, bilateral
pleural effusions and bibasilar atelectasis.  Overall slight
improved aeration may suggest resolving edema.
IMPRESSION: Persistent but slightly improved CHF.
